# Patient Record
Sex: Female | Born: 1950
Health system: Southern US, Community
[De-identification: ages and names within clinical notes are randomized; demographics above are authoritative.]

## PROBLEM LIST (undated history)

## (undated) DIAGNOSIS — I700 Atherosclerosis of aorta: Secondary | ICD-10-CM

## (undated) DIAGNOSIS — R0600 Dyspnea, unspecified: Secondary | ICD-10-CM

## (undated) DIAGNOSIS — I2510 Atherosclerotic heart disease of native coronary artery without angina pectoris: Secondary | ICD-10-CM

## (undated) DIAGNOSIS — J449 Chronic obstructive pulmonary disease, unspecified: Secondary | ICD-10-CM

## (undated) DIAGNOSIS — I82419 Acute embolism and thrombosis of unspecified femoral vein: Secondary | ICD-10-CM

## (undated) DIAGNOSIS — M1990 Unspecified osteoarthritis, unspecified site: Secondary | ICD-10-CM

## (undated) DIAGNOSIS — G8929 Other chronic pain: Secondary | ICD-10-CM

## (undated) DIAGNOSIS — J984 Other disorders of lung: Secondary | ICD-10-CM

## (undated) DIAGNOSIS — I10 Essential (primary) hypertension: Secondary | ICD-10-CM

## (undated) DIAGNOSIS — IMO0002 Reserved for concepts with insufficient information to code with codable children: Secondary | ICD-10-CM

## (undated) HISTORY — DX: Chronic obstructive pulmonary disease, unspecified: J44.9

## (undated) HISTORY — DX: Other disorders of lung: J98.4

## (undated) HISTORY — PX: TOTAL HIP ARTHROPLASTY: SHX124

## (undated) HISTORY — DX: Acute embolism and thrombosis of unspecified femoral vein: I82.419

## (undated) HISTORY — DX: Unspecified osteoarthritis, unspecified site: M19.90

## (undated) HISTORY — DX: Atherosclerosis of aorta: I70.0

## (undated) HISTORY — DX: Atherosclerotic heart disease of native coronary artery without angina pectoris: I25.10

## (undated) HISTORY — DX: Other chronic pain: G89.29

## (undated) HISTORY — DX: Reserved for concepts with insufficient information to code with codable children: IMO0002

---

## 1971-10-16 HISTORY — PX: TUBAL LIGATION: SHX77

## 2000-03-13 ENCOUNTER — Encounter: Admission: RE | Admit: 2000-03-13 | Discharge: 2000-03-13 | Payer: Self-pay | Admitting: *Deleted

## 2000-03-13 ENCOUNTER — Encounter: Payer: Self-pay | Admitting: *Deleted

## 2001-03-11 ENCOUNTER — Encounter: Payer: Self-pay | Admitting: Unknown Physician Specialty

## 2001-03-11 ENCOUNTER — Encounter: Admission: RE | Admit: 2001-03-11 | Discharge: 2001-03-11 | Payer: Self-pay | Admitting: Unknown Physician Specialty

## 2003-09-21 ENCOUNTER — Encounter: Admission: RE | Admit: 2003-09-21 | Discharge: 2003-09-21 | Payer: Self-pay | Admitting: Unknown Physician Specialty

## 2012-02-07 ENCOUNTER — Other Ambulatory Visit (HOSPITAL_COMMUNITY): Payer: Self-pay | Admitting: Pulmonary Disease

## 2012-02-07 ENCOUNTER — Ambulatory Visit (HOSPITAL_COMMUNITY)
Admission: RE | Admit: 2012-02-07 | Discharge: 2012-02-07 | Disposition: A | Discharge status: Home / Self Care | Payer: BC Managed Care – PPO | Source: Ambulatory Visit | Attending: Pulmonary Disease | Admitting: Pulmonary Disease

## 2012-02-07 DIAGNOSIS — J449 Chronic obstructive pulmonary disease, unspecified: Secondary | ICD-10-CM | POA: Insufficient documentation

## 2012-02-07 DIAGNOSIS — J4489 Other specified chronic obstructive pulmonary disease: Secondary | ICD-10-CM | POA: Insufficient documentation

## 2012-02-07 DIAGNOSIS — A150 Tuberculosis of lung: Secondary | ICD-10-CM

## 2013-01-09 ENCOUNTER — Encounter: Payer: Self-pay | Admitting: Vascular Surgery

## 2013-01-09 ENCOUNTER — Other Ambulatory Visit: Payer: Self-pay

## 2013-01-09 DIAGNOSIS — I8012 Phlebitis and thrombophlebitis of left femoral vein: Secondary | ICD-10-CM

## 2013-02-24 ENCOUNTER — Encounter: Payer: Self-pay | Admitting: Vascular Surgery

## 2013-02-25 ENCOUNTER — Encounter (INDEPENDENT_AMBULATORY_CARE_PROVIDER_SITE_OTHER): Payer: BC Managed Care – PPO | Admitting: *Deleted

## 2013-02-25 ENCOUNTER — Ambulatory Visit (INDEPENDENT_AMBULATORY_CARE_PROVIDER_SITE_OTHER): Payer: BC Managed Care – PPO | Admitting: Vascular Surgery

## 2013-02-25 ENCOUNTER — Encounter: Payer: Self-pay | Admitting: Vascular Surgery

## 2013-02-25 VITALS — BP 132/67 | HR 75 | Ht 64.5 in | Wt 152.0 lb

## 2013-02-25 DIAGNOSIS — I8012 Phlebitis and thrombophlebitis of left femoral vein: Secondary | ICD-10-CM

## 2013-02-25 DIAGNOSIS — I8010 Phlebitis and thrombophlebitis of unspecified femoral vein: Secondary | ICD-10-CM

## 2013-02-25 NOTE — Progress Notes (Signed)
Vascular and Vein Specialist of [facility] [place]   Patient name: Teresa Edwards MRN: [id number] DOB: 20-Sep-1950 Sex: female   Referred by: Charm Barges  Reason for referral:  Chief Complaint  Patient presents with  . New Evaluation    hx of DVT - knot on upper left le- Dr. Charm Barges    HISTORY OF PRESENT ILLNESS: Patient has today for discussion of her left leg venous hypertension. She is very pleasant 62 year old female who in November of 2013 had left leg pain and swelling. She underwent a venous duplex that time was found to have a left leg DVT he was admitted to the hospital for treatment. She has been on Xaralto since that time. She is here today with concern regarding changes of chronic venous hypertension. She reports that she is having a discoloration and swelling in her left leg and also has some areas of varicosities appearing in her medial left thigh. She denies any symptoms of arterial insufficiency. She does have a long history of degenerative disc disease in her back and had to discontinue treatment for this with injections due to her anticoagulation therapy. She denies any cardiac difficulty and has no history of stroke    Past Medical History  Diagnosis Date  . DDD (degenerative disc disease)   . DVT femoral (deep venous thrombosis) with thrombophlebitis   . DJD (degenerative joint disease)   . COPD (chronic obstructive pulmonary disease)     Past Surgical History  Procedure Laterality Date  . Tubal ligation  10/1971    History   Social History  . Marital Status: Married    Spouse Name: N/A    Number of Children: N/A  . Years of Education: N/A   Occupational History  . Not on file.   Social History Main Topics  . Smoking status: Current Every Day Smoker -- 0.50 packs/day for 40 years    Types: Cigarettes  . Smokeless tobacco: Never Used  . Alcohol Use: No  . Drug Use: No  . Sexually Active: Not on file   Other Topics Concern  . Not on file   Social History  Narrative  . No narrative on file    Family History  Problem Relation Age of Onset  . Deep vein thrombosis Mother   . Other Mother     varicose veins  . Hypertension Sister   . Other Sister     varicose veins  . Cancer Brother     Allergies as of 02/25/2013  . (No Known Allergies)    Current Outpatient Prescriptions on File Prior to Visit  Medication Sig Dispense Refill  . ALPRAZolam (XANAX) 0.5 MG tablet Take 0.5 mg by mouth 3 (three) times daily.      . celecoxib (CELEBREX) 200 MG capsule Take 200 mg by mouth daily.      . cyclobenzaprine (FLEXERIL) 10 MG tablet Take 10 mg by mouth daily.      Marland Kitchen oxycodone (ROXICODONE) 30 MG immediate release tablet Take 30 mg by mouth every 4 (four) hours as needed for pain.      . Rivaroxaban (XARELTO) 20 MG TABS Take 20 mg by mouth daily.       No current facility-administered medications on file prior to visit.     REVIEW OF SYSTEMS:  Positives indicated with an "X"  CARDIOVASCULAR:  [ ]  chest pain   [ ]  chest pressure   [ ]  palpitations   [ ]  orthopnea   [x]  dyspnea on exertion   [ ]   claudication   [ ]  rest pain   [x ] DVT   [ ]  phlebitis PULMONARY:   [ ]  productive cough   [ ]  asthma   [ ]  wheezing NEUROLOGIC:   [ ]  weakness  [ ]  paresthesias  [ ]  aphasia  [ ]  amaurosis  [ ]  dizziness HEMATOLOGIC:   [ ]  bleeding problems   [ ]  clotting disorders MUSCULOSKELETAL:  [ ]  joint pain   [ ]  joint swelling GASTROINTESTINAL: [ ]   blood in stool  [ ]   hematemesis GENITOURINARY:  [ ]   dysuria  [ ]   hematuria PSYCHIATRIC:  [ ]  history of major depression INTEGUMENTARY:  [ ]  rashes  [ ]  ulcers CONSTITUTIONAL:  [ ]  fever   [ ]  chills  PHYSICAL EXAMINATION:  General: The patient is a well-nourished female, in no acute distress. Vital signs are BP 132/67  Pulse 75  Ht 5' 4.5" (1.638 m)  Wt 152 lb (68.947 kg)  BMI 25.7 kg/m2  SpO2 100% Pulmonary: There is a good air exchange bilaterally without wheezing or rales. Abdomen: Soft and  non-tender with normal pitch bowel sounds. Musculoskeletal: There are no major deformities.  There is no significant extremity pain. Neurologic: No focal weakness or paresthesias are detected, Skin: There are no ulcer or rashes noted. Psychiatric: The patient has normal affect. Cardiovascular: There is a regular rate and rhythm without significant murmur appreciated. Pulse status: 2+ radial and 2+ posterior tibial pulses bilaterally She does have skin changes on her left leg with extensive telangiectasia over foot and ankle and some thickening over the medial aspect of her ankle. There no open ulceration [facility] Vascular Lab Studies:  Ordered and Independently Reviewed chronic thrombus in her left femoral and popliteal veins. No evidence of superficial thrombophlebitis  Impression and Plan:  DVT in November 2013 with appropriate anticoagulation treatment since that time. I had a long discussion with the patient. She did not have any prior history of DVT and no documented hypercoagulable state. I feel that it is safe to discontinue her anticoagulation therapy at any time. She will discuss this with Dr. Charm Barges. I did explain the area of concern in her thigh was related to varicosities that she is developing due to the venous hypertension. She is having no discomfort over these areas. Also explained the changes in skin that she is developing in her left ankle. She does have a compression hose but unfortunately is unable to wear these do to her degenerative disc disease in her back. A I discussed the importance of elevation and compression. She will attempt this and will see [place] again on an as-needed basis    Terilynn Buresh [facility] [place] Office: [phone]

## 2013-10-29 ENCOUNTER — Ambulatory Visit (INDEPENDENT_AMBULATORY_CARE_PROVIDER_SITE_OTHER): Payer: BC Managed Care – PPO | Admitting: Neurology

## 2013-10-29 ENCOUNTER — Encounter (INDEPENDENT_AMBULATORY_CARE_PROVIDER_SITE_OTHER): Payer: Self-pay

## 2013-10-29 ENCOUNTER — Encounter: Payer: Self-pay | Admitting: Neurology

## 2013-10-29 VITALS — BP 147/82 | HR 78 | Ht 64.5 in | Wt 147.0 lb

## 2013-10-29 DIAGNOSIS — R269 Unspecified abnormalities of gait and mobility: Secondary | ICD-10-CM

## 2013-10-29 DIAGNOSIS — M545 Low back pain, unspecified: Secondary | ICD-10-CM

## 2013-10-29 DIAGNOSIS — M79609 Pain in unspecified limb: Secondary | ICD-10-CM

## 2013-10-29 NOTE — Progress Notes (Signed)
Reason for visit: Leg discomfort  Teresa Edwards is a 63 y.o. female  History of present illness:  Teresa Edwards is a 63 year old white female with a history of low back pain with L4-5 spondylitic degeneration. The patient has a history of chronic low back pain that has been unchanged. Three months ago however, the patient indicated that she began having discomfort in both legs. The patient primarily has pain below the knees in the calf areas with walking, and this tends to go away with sitting or lying down. The patient will sometimes have pain above the knees and at the knee level. The patient denies any numbness in the lower extremities. Both legs are painful, right equal to left. The patient indicates that she has to walk slowly. The patient is begun using a cane for ambulation, and she denies any falls. The patient has not had any neck pain or pain down the legs. The patient has had no change in the function of the bowels or the bladder. Recent MRI evaluation of the low back is not available to me, but it appears that there is no evidence of nerve root compression or severe spinal stenosis. The patient may have some minimal spinal stenosis at the L2-3 level. The patient is sent to this office for further evaluation. The patient recently had a DVT involving the left leg. Arterial Dopplers have not been done recently.  Past Medical History  Diagnosis Date  . DDD (degenerative disc disease)   . DVT femoral (deep venous thrombosis) with thrombophlebitis   . DJD (degenerative joint disease)   . COPD (chronic obstructive pulmonary disease)     Past Surgical History  Procedure Laterality Date  . Tubal ligation  10/1971    Family History  Problem Relation Age of Onset  . Deep vein thrombosis Mother   . Other Mother     varicose veins  . Hypertension Sister   . Other Sister     varicose veins  . Cancer Brother     Social history:  reports that she has been smoking Cigarettes.  She  has a 20 pack-year smoking history. She has never used smokeless tobacco. She reports that she does not drink alcohol or use illicit drugs.  Medications:  Current Outpatient Prescriptions on File Prior to Visit  Medication Sig Dispense Refill  . ALPRAZolam (XANAX) 0.5 MG tablet Take 0.5 mg by mouth 3 (three) times daily.      Marland Kitchen. oxycodone (ROXICODONE) 30 MG immediate release tablet Take 30 mg by mouth every 4 (four) hours as needed for pain.       No current facility-administered medications on file prior to visit.     No Known Allergies  ROS:  Out of a complete 14 system review of symptoms, the patient complains only of the following symptoms, and all other reviewed systems are negative.  Leg pain, back pain Gait disorder  Blood pressure 147/82, pulse 78, height 5' 4.5" (1.638 m), weight 147 lb (66.679 kg).  Physical Exam  General: The patient is alert and cooperative at the time of the examination.  Eyes: Pupils are equal, round, and reactive to light. Discs are flat bilaterally.  Neck: The neck is supple, no carotid bruits are noted.  Respiratory: The respiratory examination is clear.  Cardiovascular: The cardiovascular examination reveals a regular rate and rhythm, no obvious murmurs or rubs are noted.  Neuromuscular: The patient is able to flex approximately 110 with the low back. No significant pain  with palpation of the low back is noted. No significant discomfort is noted with rotation of the hips.  Skin: Extremities are without significant edema. The posterior tibial pulse is felt on the right foot, not on the left. Dorsalis pedis pulses are not palpable on either side.  Neurologic Exam  Mental status: The patient is alert and oriented x 3 at the time of the examination. The patient has apparent normal recent and remote memory, with an apparently normal attention span and concentration ability.  Cranial nerves: Facial symmetry is present. There is good sensation of  the face to pinprick and soft touch bilaterally. The strength of the facial muscles and the muscles to head turning and shoulder shrug are normal bilaterally. Speech is well enunciated, no aphasia or dysarthria is noted. Extraocular movements are full. Visual fields are full. The tongue is midline, and the patient has symmetric elevation of the soft palate. No obvious hearing deficits are noted.  Motor: The motor testing reveals 5 over 5 strength of all 4 extremities. Good symmetric motor tone is noted throughout. The patient is able to arise from seated position with arms crossed.  Sensory: Sensory testing is intact to pinprick, soft touch, vibration sensation, and position sense on all 4 extremities, with exception that position sense is decreased in the left foot. No evidence of extinction is noted.  Coordination: Cerebellar testing reveals good finger-nose-finger and heel-to-shin bilaterally.  Gait and station: Gait is slow, slightly wide-based. Tandem gait is slightly unsteady. Romberg is negative. No drift is seen.  Reflexes: Deep tendon reflexes are symmetric and normal bilaterally, with exception of ankle jerk reflexes are depressed bilaterally. Toes are downgoing bilaterally.   Assessment/Plan:  1. Chronic low back pain  2. Lower extremity discomfort, gait disorder  The patient has had chronic low back pain but more recently she has begun to have lower extremity pain and discomfort. The pain is there with sitting or lying down, but is worse with ambulation. The patient will be set up for nerve conduction studies of both legs, and EMG of the lower extremities. If this is unremarkable, the patient may be sent for arterial dopplers of the lower extremities. The patient takes oxycodone if needed for pain.  Marlan Palau. Keith Luberta Grabinski MD 10/29/2013 7:18 PM  [facility] [address] [place] [id number]  Phone [phone] Fax [phone]

## 2013-10-29 NOTE — Patient Instructions (Signed)
Back Pain, Adult Low back pain is very common. About 1 in 5 people have back pain.The cause of low back pain is rarely dangerous. The pain often gets better over time.About half of people with a sudden onset of back pain feel better in just 2 weeks. About 8 in 10 people feel better by 6 weeks.  CAUSES Some common causes of back pain include:  Strain of the muscles or ligaments supporting the spine.  Wear and tear (degeneration) of the spinal discs.  Arthritis.  Direct injury to the back. DIAGNOSIS Most of the time, the direct cause of low back pain is not known.However, back pain can be treated effectively even when the exact cause of the pain is unknown.Answering your caregiver's questions about your overall health and symptoms is one of the most accurate ways to make sure the cause of your pain is not dangerous. If your caregiver needs more information, he or she may order lab work or imaging tests (X-rays or MRIs).However, even if imaging tests show changes in your back, this usually does not require surgery. HOME CARE INSTRUCTIONS For many people, back pain returns.Since low back pain is rarely dangerous, it is often a condition that people can learn to manageon their own.   Remain active. It is stressful on the back to sit or stand in one place. Do not sit, drive, or stand in one place for more than 30 minutes at a time. Take short walks on level surfaces as soon as pain allows.Try to increase the length of time you walk each day.  Do not stay in bed.Resting more than 1 or 2 days can delay your recovery.  Do not avoid exercise or work.Your body is made to move.It is not dangerous to be active, even though your back may hurt.Your back will likely heal faster if you return to being active before your pain is gone.  Pay attention to your body when you bend and lift. Many people have less discomfortwhen lifting if they bend their knees, keep the load close to their bodies,and  avoid twisting. Often, the most comfortable positions are those that put less stress on your recovering back.  Find a comfortable position to sleep. Use a firm mattress and lie on your side with your knees slightly bent. If you lie on your back, put a pillow under your knees.  Only take over-the-counter or prescription medicines as directed by your caregiver. Over-the-counter medicines to reduce pain and inflammation are often the most helpful.Your caregiver may prescribe muscle relaxant drugs.These medicines help dull your pain so you can more quickly return to your normal activities and healthy exercise.  Put ice on the injured area.  Put ice in a plastic bag.  Place a towel between your skin and the bag.  Leave the ice on for 15-20 minutes, 03-04 times a day for the first 2 to 3 days. After that, ice and heat may be alternated to reduce pain and spasms.  Ask your caregiver about trying back exercises and gentle massage. This may be of some benefit.  Avoid feeling anxious or stressed.Stress increases muscle tension and can worsen back pain.It is important to recognize when you are anxious or stressed and learn ways to manage it.Exercise is a great option. SEEK MEDICAL CARE IF:  You have pain that is not relieved with rest or medicine.  You have pain that does not improve in 1 week.  You have new symptoms.  You are generally not feeling well. SEEK   IMMEDIATE MEDICAL CARE IF:   You have pain that radiates from your back into your legs.  You develop new bowel or bladder control problems.  You have unusual weakness or numbness in your arms or legs.  You develop nausea or vomiting.  You develop abdominal pain.  You feel faint. Document Released: 07/03/2005 Document Revised: 01/02/2012 Document Reviewed: 11/21/2010 Exit[employer] Patient Information 2014 [employer].  

## 2013-10-30 LAB — SEDIMENTATION RATE: SED RATE: 6 mm/h (ref 0–40)

## 2013-10-30 LAB — ANA W/REFLEX: ANA: NEGATIVE

## 2013-10-30 LAB — ANGIOTENSIN CONVERTING ENZYME: ANGIO CONVERT ENZYME: 35 U/L (ref 14–82)

## 2013-10-30 LAB — CK: Total CK: 51 U/L (ref 24–173)

## 2013-10-30 LAB — RHEUMATOID FACTOR: RHEUMATOID FACTOR: 9.6 [IU]/mL (ref 0.0–13.9)

## 2013-10-30 LAB — LYME, TOTAL AB TEST/REFLEX

## 2013-10-31 NOTE — Progress Notes (Signed)
Quick Note:  I called and gave her the results of labs. She will activate mychart, if not will get copy when in for [facility]. ______

## 2013-11-10 ENCOUNTER — Ambulatory Visit (INDEPENDENT_AMBULATORY_CARE_PROVIDER_SITE_OTHER): Payer: BC Managed Care – PPO | Admitting: Neurology

## 2013-11-10 ENCOUNTER — Encounter (INDEPENDENT_AMBULATORY_CARE_PROVIDER_SITE_OTHER): Payer: Self-pay | Admitting: Radiology

## 2013-11-10 DIAGNOSIS — M79609 Pain in unspecified limb: Secondary | ICD-10-CM

## 2013-11-10 DIAGNOSIS — Z0289 Encounter for other administrative examinations: Secondary | ICD-10-CM

## 2013-11-10 DIAGNOSIS — I70219 Atherosclerosis of native arteries of extremities with intermittent claudication, unspecified extremity: Secondary | ICD-10-CM

## 2013-11-10 DIAGNOSIS — M545 Low back pain, unspecified: Secondary | ICD-10-CM

## 2013-11-10 DIAGNOSIS — R269 Unspecified abnormalities of gait and mobility: Secondary | ICD-10-CM

## 2013-11-10 NOTE — Progress Notes (Signed)
Teresa Edwards is a 63 year old patient with a long-standing history of low back pain. Over the last 3 months, she has developed pain in the calf muscles bilaterally with walking. She is being evaluated for possible lumbosacral radiculopathy. No evidence of spinal stenosis is seen by MRI of the lumbosacral spine.  EMG evaluations of both legs were essentially normal, without evidence of an overlying lumbosacral radiculopathy. No evidence of a peripheral neuropathy is seen.  The patient will be sent for arterial Doppler studies of the legs.

## 2013-11-10 NOTE — Procedures (Signed)
     HISTORY:  Teresa Edwards is a 63 year old patient with a history of chronic low back pain. Within the last 3 months, she has noted some issues with discomfort in the legs and the calf muscles with walking, better with rest. The patient is being evaluated for this new issue.  NERVE CONDUCTION STUDIES:  Nerve conduction studies were performed on both lower extremities. The distal motor latencies and motor amplitudes for the peroneal and posterior tibial nerves were within normal limits. The nerve conduction velocities for these nerves were also normal. The H reflex latencies were prolonged bilaterally. The F wave latencies for the peroneal nerves were normal bilaterally, and normal for the right posterior tibial nerve. The left posterior tibial nerve F wave latency was prolonged. The sensory latencies for the peroneal nerves were within normal limits.   EMG STUDIES:  EMG study was performed on the left lower extremity:  The tibialis anterior muscle reveals 2 to 4K motor units with full recruitment. No fibrillations or positive waves were seen. The peroneus tertius muscle reveals 2 to 4K motor units with full recruitment. No fibrillations or positive waves were seen. The medial gastrocnemius muscle reveals 1 to 3K motor units with full recruitment. No fibrillations or positive waves were seen. Some increased insertional activity was seen. The vastus lateralis muscle reveals 2 to 4K motor units with full recruitment. No fibrillations or positive waves were seen. The iliopsoas muscle reveals 2 to 4K motor units with full recruitment. No fibrillations or positive waves were seen. The biceps femoris muscle (long head) reveals 2 to 4K motor units with full recruitment. No fibrillations or positive waves were seen. The lumbosacral paraspinal muscles were tested at 3 levels, and revealed no abnormalities of insertional activity at all 3 levels tested. There was good relaxation.  EMG study was  performed on the right lower extremity:  The tibialis anterior muscle reveals 2 to 4K motor units with full recruitment. No fibrillations or positive waves were seen. The peroneus tertius muscle reveals 2 to 4K motor units with full recruitment. No fibrillations or positive waves were seen. The medial gastrocnemius muscle reveals 1 to 3K motor units with full recruitment. No fibrillations or positive waves were seen. The vastus lateralis muscle reveals 2 to 4K motor units with full recruitment. No fibrillations or positive waves were seen. The iliopsoas muscle reveals 2 to 4K motor units with full recruitment. No fibrillations or positive waves were seen. The biceps femoris muscle (long head) reveals 2 to 4K motor units with full recruitment. No fibrillations or positive waves were seen. The lumbosacral paraspinal muscles were tested at 3 levels, and revealed no abnormalities of insertional activity at all 3 levels tested. There was good relaxation.   IMPRESSION:  Nerve conduction studies done on both lower extremities were relatively unremarkable with exception of prolongation of the left posterior tibial F-wave latency and the H reflex latencies bilaterally. EMG evaluation of the right lower extremity was unremarkable, without evidence of an overlying lumbosacral radiculopathy. EMG of the left lower extremity was essentially normal with exception of minimal increased insertional activity of the gastrocnemius muscle. There is no clear evidence of an overlying lumbosacral radiculopathy on either side.  Teresa Edwards. Keith Kamerin Axford MD 11/10/2013 1:59 PM  [facility] [address] [place], [place] [id number]  Phone [phone] Fax [phone]

## 2013-11-26 ENCOUNTER — Other Ambulatory Visit (HOSPITAL_COMMUNITY): Payer: Self-pay | Admitting: Cardiology

## 2013-11-26 DIAGNOSIS — I70219 Atherosclerosis of native arteries of extremities with intermittent claudication, unspecified extremity: Secondary | ICD-10-CM

## 2013-11-26 DIAGNOSIS — I739 Peripheral vascular disease, unspecified: Secondary | ICD-10-CM

## 2013-11-28 ENCOUNTER — Ambulatory Visit (HOSPITAL_COMMUNITY): Payer: BC Managed Care – PPO | Attending: Neurology | Admitting: Cardiology

## 2013-11-28 DIAGNOSIS — I70219 Atherosclerosis of native arteries of extremities with intermittent claudication, unspecified extremity: Secondary | ICD-10-CM | POA: Insufficient documentation

## 2013-11-28 DIAGNOSIS — M79609 Pain in unspecified limb: Secondary | ICD-10-CM

## 2013-11-28 DIAGNOSIS — I739 Peripheral vascular disease, unspecified: Secondary | ICD-10-CM

## 2013-11-28 NOTE — Progress Notes (Signed)
LEA Doppler performed 

## 2013-11-30 ENCOUNTER — Telehealth: Payer: Self-pay | Admitting: Neurology

## 2013-11-30 NOTE — Telephone Encounter (Signed)
I called the patient. The arterial dopplers of the legs were normal. Not clear what the cause of the leg pain is. L/S MRI done recently. May need to treat the symptoms only. EMG was OK.

## 2014-06-03 ENCOUNTER — Encounter: Payer: Self-pay | Admitting: Neurology

## 2014-06-09 ENCOUNTER — Encounter: Payer: Self-pay | Admitting: Neurology

## 2014-08-28 ENCOUNTER — Institutional Professional Consult (permissible substitution): Payer: Self-pay | Admitting: Internal Medicine

## 2014-08-31 ENCOUNTER — Encounter: Payer: Self-pay | Admitting: Pulmonary Disease

## 2014-08-31 ENCOUNTER — Telehealth: Payer: Self-pay | Admitting: Pulmonary Disease

## 2014-08-31 ENCOUNTER — Ambulatory Visit (INDEPENDENT_AMBULATORY_CARE_PROVIDER_SITE_OTHER): Payer: Medicare Other | Admitting: Pulmonary Disease

## 2014-08-31 VITALS — BP 142/82 | HR 75 | Temp 97.0°F | Ht 64.0 in | Wt 134.0 lb

## 2014-08-31 DIAGNOSIS — J984 Other disorders of lung: Secondary | ICD-10-CM

## 2014-08-31 DIAGNOSIS — B440 Invasive pulmonary aspergillosis: Secondary | ICD-10-CM | POA: Insufficient documentation

## 2014-08-31 MED ORDER — AMOXICILLIN-POT CLAVULANATE 875-125 MG PO TABS: 1.0000 | ORAL_TABLET | Freq: Two times a day (BID) | ORAL | Status: DC

## 2014-08-31 NOTE — Telephone Encounter (Signed)
I spoke with Beth with [facility].  She called [facility] and switch over augmentin rx - nothing further needed.

## 2014-08-31 NOTE — Patient Instructions (Signed)
Will treat with augmentin 875mg  one in am and pm with large glass of water on a full stomach for 2 weeks. Will schedule for followup scan of chest in 4 weeks.  If there is no change or if larger, will need a biopsy Stop smoking!! Will call you once I see results of chest ct.

## 2014-08-31 NOTE — Progress Notes (Signed)
   Subjective:    Patient ID: Teresa Edwards, female    DOB: [birth year], 64 y.o.   MRN: [id number]  HPI The patient is a 64 year old female who I've been asked to see for an abnormal CT chest. She has a long history of tobacco abuse, and unfortunately continues to smoke. She developed bronchitis-like symptoms in November with increasing shortness of breath, chest congestion, and cough. She did not have purulent mucus or hemoptysis. She was treated at that time with Biaxin, and felt that she improved in every respect except for her cough. She had a chest x-ray the end of November that did not show any acute abnormality. Because of her persistent cough, she had a follow-up film the middle of January of this year, and it showed an abnormal density at the right base that was new. This was followed by a CT chest which showed a 2.5 cm cavitary density in the right lower lobe, with no significant mediastinal lymphadenopathy. So had significant emphysematous changes. The patient currently is bringing up clear mucus, but is not congested and does not feel that her breathing is any worse than her usual baseline. His had no pleuritic chest pain or hemoptysis. She has no history of TB exposure, but tells me that she did have a positive [facility]. Chest x-ray was unremarkable, and she was not started on chemoprophylaxis. The patient denies any significant anorexia or weight loss. It should be noted that she does have poor dentition.   Review of Systems  Constitutional: Negative for fever and unexpected weight change.  HENT: Negative for congestion, dental problem, ear pain, nosebleeds, postnasal drip, rhinorrhea, sinus pressure, sneezing, sore throat and trouble swallowing.   Eyes: Negative for redness and itching.  Respiratory: Positive for cough and shortness of breath. Negative for chest tightness and wheezing.   Cardiovascular: Positive for leg swelling. Negative for  palpitations.  Gastrointestinal: Negative for nausea and vomiting.  Genitourinary: Negative for dysuria.  Musculoskeletal: Negative for joint swelling.  Skin: Negative for rash.  Neurological: Negative for headaches.  Hematological: Does not bruise/bleed easily.  Psychiatric/Behavioral: Negative for dysphoric mood. The patient is not nervous/anxious.        Objective:   Physical Exam Constitutional:  Thin female, no acute distress  HENT:  Nares patent without discharge, but narrowed bilat  Oropharynx without exudate, palate and uvula are normal  Eyes:  Perrla, eomi, no scleral icterus  Neck:  No JVD, no TMG  Cardiovascular:  Normal rate, regular rhythm, no rubs or gallops.  No murmurs        Intact distal pulses  Pulmonary :  decreased breath sounds, no stridor or respiratory distress   No rales, rhonchi, or wheezing  Abdominal:  Soft, nondistended, bowel sounds present.  No tenderness noted.   Musculoskeletal:  1+ lower extremity edema noted.  Lymph Nodes:  No cervical lymphadenopathy noted  Skin:  No cyanosis noted  Neurologic:  Alert, appropriate, moves all 4 extremities without obvious deficit.         Assessment & Plan:

## 2014-08-31 NOTE — Assessment & Plan Note (Signed)
The patient has an abnormal density in the right lower lobe that appeared on a chest x-ray between the end of November of last year in the middle of January this year. The CT chest shows a 2.5 cm cavitary density in the right lower lobe with no other significant abnormalities. I suspect it is unlikely this represents a malignancy developing in a 6-8 week period, although I cannot totally exclude this with 100% certainty. More than likely, this represents a lung abscess, especially given the patient's poor dentition. She has been treated with a macrolide back in November, but has not been treated for a lung abscess. I would like to treat her with a 2 week course of Augmentin, and then repeat her CT 2 weeks after that. I have also offered to biopsy the lesion in question if she is going to worry about this over the next month. However, there are potential risk such as bleeding and pneumothorax. After a long discussion with her and her family, we have all decided to try the antibiotics prior to biopsying the abnormality.

## 2014-09-08 ENCOUNTER — Institutional Professional Consult (permissible substitution): Payer: Self-pay | Admitting: Internal Medicine

## 2014-09-28 ENCOUNTER — Ambulatory Visit (INDEPENDENT_AMBULATORY_CARE_PROVIDER_SITE_OTHER)
Admission: RE | Admit: 2014-09-28 | Discharge: 2014-09-28 | Disposition: A | Discharge status: Home / Self Care | Payer: 59 | Source: Ambulatory Visit | Attending: Pulmonary Disease | Admitting: Pulmonary Disease

## 2014-09-28 DIAGNOSIS — J984 Other disorders of lung: Secondary | ICD-10-CM

## 2014-09-29 ENCOUNTER — Other Ambulatory Visit: Payer: Self-pay | Admitting: Pulmonary Disease

## 2014-10-01 ENCOUNTER — Other Ambulatory Visit: Payer: Self-pay | Admitting: Pulmonary Disease

## 2014-10-01 MED ORDER — AMOXICILLIN-POT CLAVULANATE 875-125 MG PO TABS: 1.0000 | ORAL_TABLET | Freq: Two times a day (BID) | ORAL | Status: DC

## 2014-11-13 ENCOUNTER — Encounter: Payer: Self-pay | Admitting: Pulmonary Disease

## 2014-11-13 ENCOUNTER — Ambulatory Visit (INDEPENDENT_AMBULATORY_CARE_PROVIDER_SITE_OTHER): Payer: 59 | Admitting: Pulmonary Disease

## 2014-11-13 ENCOUNTER — Encounter (INDEPENDENT_AMBULATORY_CARE_PROVIDER_SITE_OTHER): Payer: Self-pay

## 2014-11-13 DIAGNOSIS — J984 Other disorders of lung: Secondary | ICD-10-CM | POA: Diagnosis not present

## 2014-11-13 NOTE — Progress Notes (Signed)
   Subjective:    Patient ID: Teresa Edwards, female    DOB: Feb 21, 1951, 64 y.o.   MRN: [id number]  HPI The patient comes in today for follow-up of her right lower lobe mass, felt secondary to a lung abscess. He was treated with 2 weeks of Augmentin, and a follow-up scan showed significant improvement. She has been treated with an additional 2 weeks of Augmentin, and comes in today for follow-up. She feels like her cough is significantly improved, and has no further chest congestion or purulence. However, she unfortunately continues to smoke, but she is trying to cut back. Her appetite has improved, and her weight stabilized.   Review of Systems  Constitutional: Negative for fever and unexpected weight change.  HENT: Negative for congestion, dental problem, ear pain, nosebleeds, postnasal drip, rhinorrhea, sinus pressure, sneezing, sore throat and trouble swallowing.   Eyes: Negative for redness and itching.  Respiratory: Positive for cough and shortness of breath. Negative for chest tightness and wheezing.   Cardiovascular: Positive for leg swelling. Negative for palpitations.  Gastrointestinal: Negative for nausea and vomiting.  Genitourinary: Negative for dysuria.  Musculoskeletal: Negative for joint swelling.  Skin: Negative for rash.  Neurological: Negative for headaches.  Hematological: Does not bruise/bleed easily.  Psychiatric/Behavioral: Negative for dysphoric mood. The patient is not nervous/anxious.        Objective:   Physical Exam Thin female in no acute distress Nose without purulence or discharge noted Neck without lymphadenopathy or thyromegaly Chest with decreased breath sounds, no wheezes or crackles Cardiac exam with regular rate and rhythm Lower extremities without edema, no cyanosis Alert and oriented, moves all 4 extremities.       Assessment & Plan:

## 2014-11-13 NOTE — Patient Instructions (Signed)
Keep working on smoking cessation. Will schedule for followup limited scan the end of May.   Will call you with the results.

## 2014-11-13 NOTE — Assessment & Plan Note (Signed)
The patient has a history of a right lower lobe mass, but had a very significant response to treatment with Augmentin. She has now completed another 2 weeks of therapy, and feels that she is greatly improved. Given her extensive smoking history, we need to do a follow-up scan to make sure that her right lower lobe process has resolved. I also asked her to try and improve her dentition, since this was the cause of her right lower lobe process. Finally, I have encouraged her to work aggressively on total smoking cessation.

## 2014-12-15 ENCOUNTER — Ambulatory Visit (INDEPENDENT_AMBULATORY_CARE_PROVIDER_SITE_OTHER)
Admission: RE | Admit: 2014-12-15 | Discharge: 2014-12-15 | Disposition: A | Discharge status: Home / Self Care | Payer: 59 | Source: Ambulatory Visit | Attending: Pulmonary Disease | Admitting: Pulmonary Disease

## 2014-12-15 DIAGNOSIS — J984 Other disorders of lung: Secondary | ICD-10-CM

## 2014-12-21 ENCOUNTER — Telehealth: Payer: Self-pay | Admitting: Pulmonary Disease

## 2014-12-21 DIAGNOSIS — R911 Solitary pulmonary nodule: Secondary | ICD-10-CM

## 2014-12-21 NOTE — Telephone Encounter (Signed)
Patient notified of results and agreed to PET scan.  Order for PET scan entered.  Nothing further needed.

## 2014-12-29 ENCOUNTER — Encounter (HOSPITAL_COMMUNITY)
Admission: RE | Admit: 2014-12-29 | Discharge: 2014-12-29 | Disposition: A | Discharge status: Home / Self Care | Payer: 59 | Source: Ambulatory Visit | Attending: Pulmonary Disease | Admitting: Pulmonary Disease

## 2014-12-29 DIAGNOSIS — R911 Solitary pulmonary nodule: Secondary | ICD-10-CM | POA: Diagnosis not present

## 2014-12-29 MED ORDER — FLUDEOXYGLUCOSE F - 18 (FDG) INJECTION
6.7000 | Freq: Once | INTRAVENOUS | Status: AC | PRN
  Administered 2014-12-29: 6.7 via INTRAVENOUS

## 2014-12-30 LAB — GLUCOSE, CAPILLARY: Glucose-Capillary: 86 mg/dL (ref 65–99)

## 2016-10-11 IMAGING — CT CT CHEST LIMITED W/O CM
2 of 3 series · 15 of 30 positions shown, 18 images · IV contrast (Omnipaque 300)
Comparison: September 28, 2014.

CLINICAL DATA: Right lower lobe cavitating lung mass.

EXAM:
CT CHEST WITHOUT CONTRAST
TECHNIQUE: Multidetector CT imaging of the chest was performed following the
standard protocol without IV contrast.

[Series 2: chest routine with · axial · 0.68mm/px · z∈[-296,-202]mm · 12 of 22 slices shown, 15 images]
[im 2/22  mediastinal]
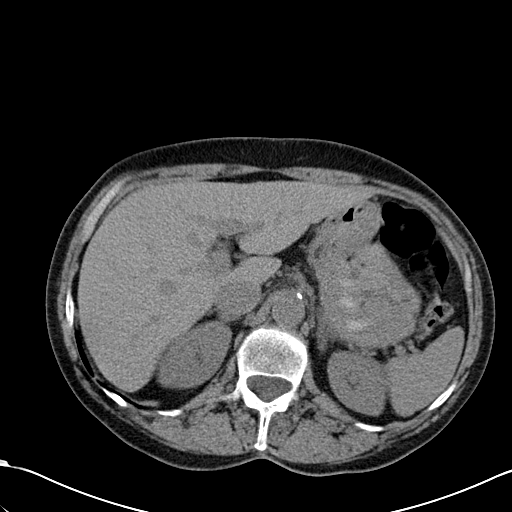
[im 2/22  lung]
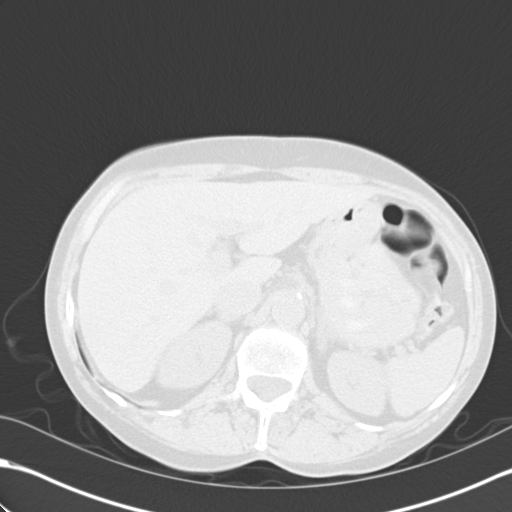
[im 4/22  lung]
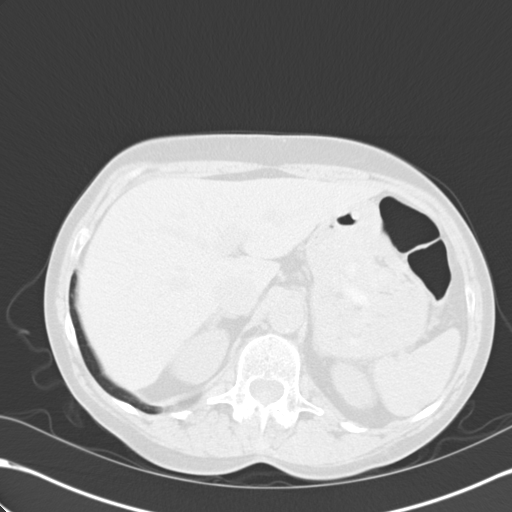
[im 6/22  lung]
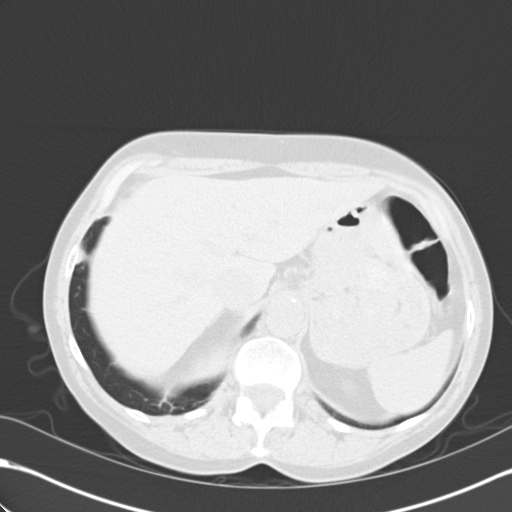
[im 7/22  lung]
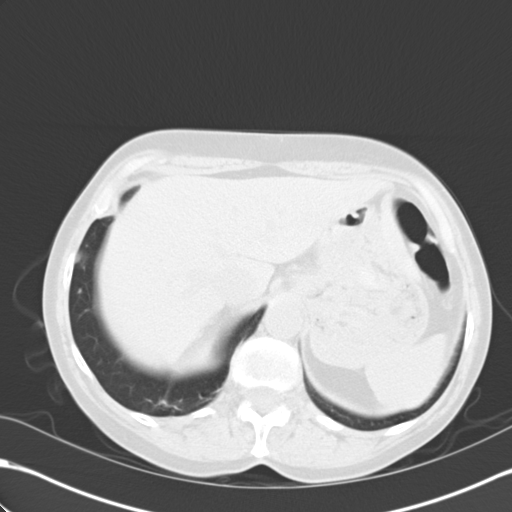
[im 9/22  mediastinal]
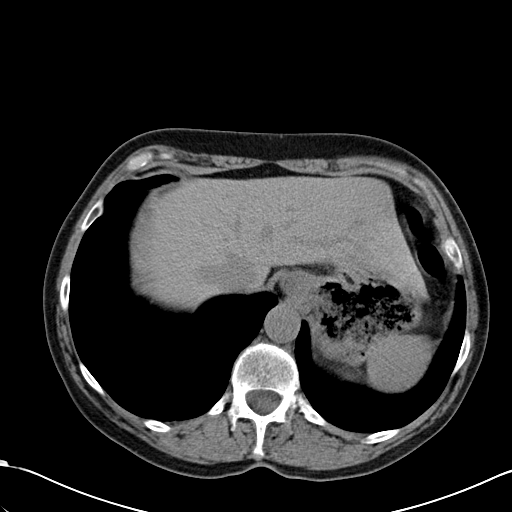
[im 9/22  lung]
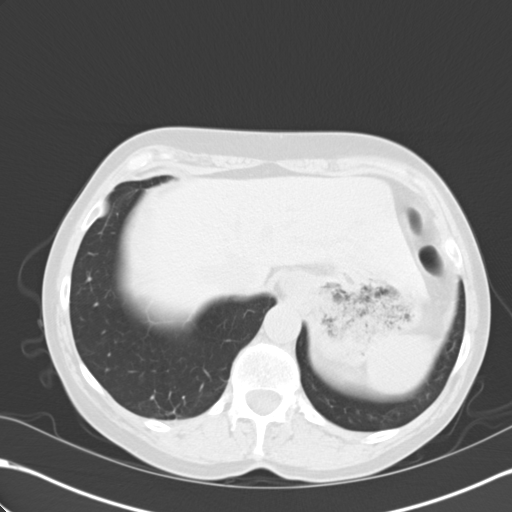
[im 11/22  lung]
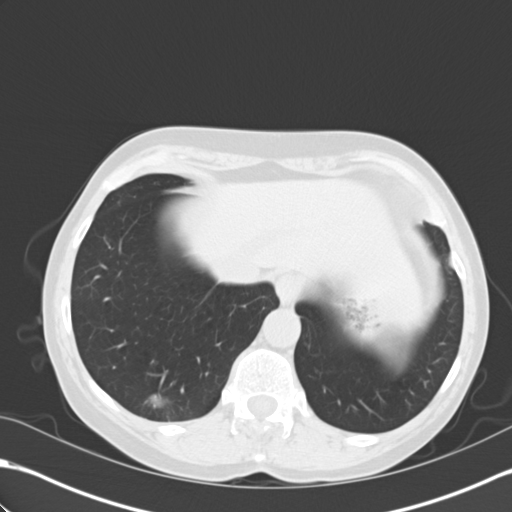
[im 12/22  lung]
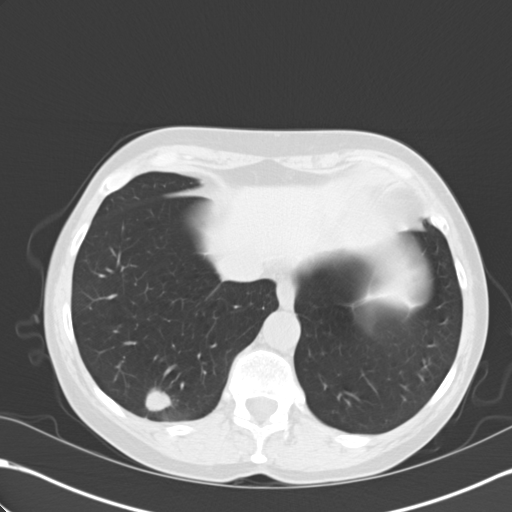
[im 14/22  lung]
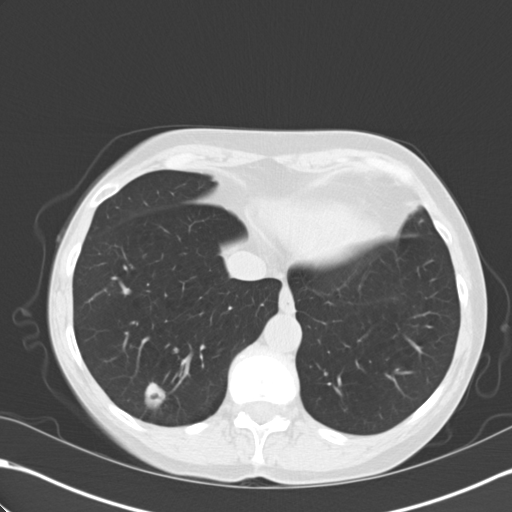
[im 16/22  mediastinal]
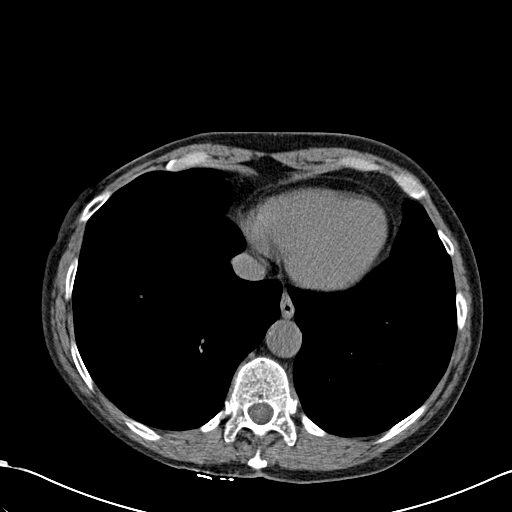
[im 16/22  lung]
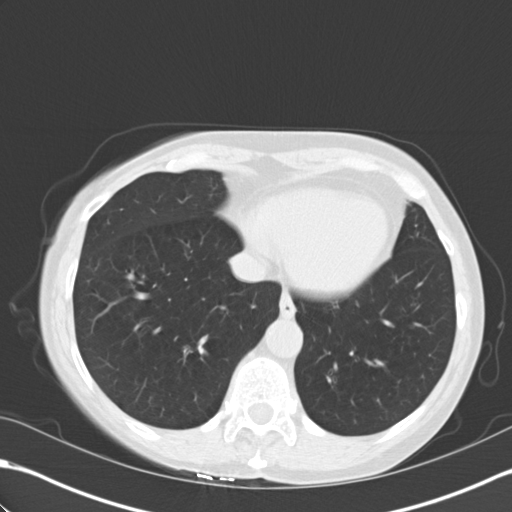
[im 17/22  lung]
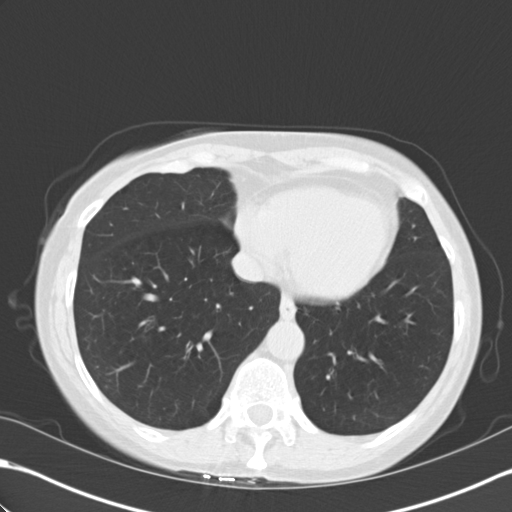
[im 19/22  lung]
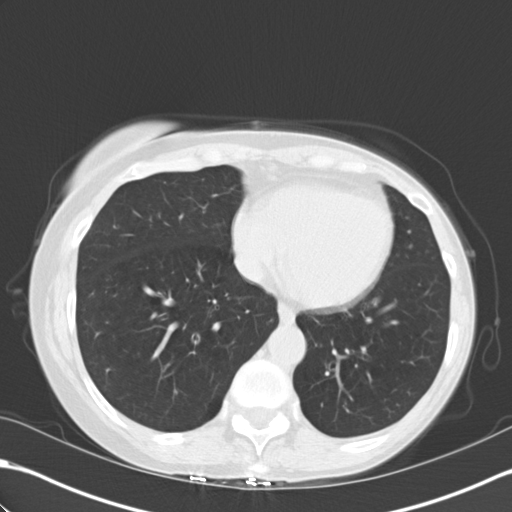
[im 21/22  lung]
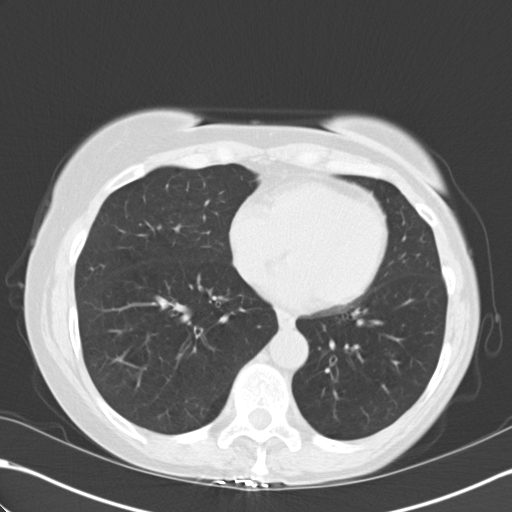

[Series 602: cor · coronal · 0.68mm/px · 3 of 106 slices shown]
[im 22/106  lung]
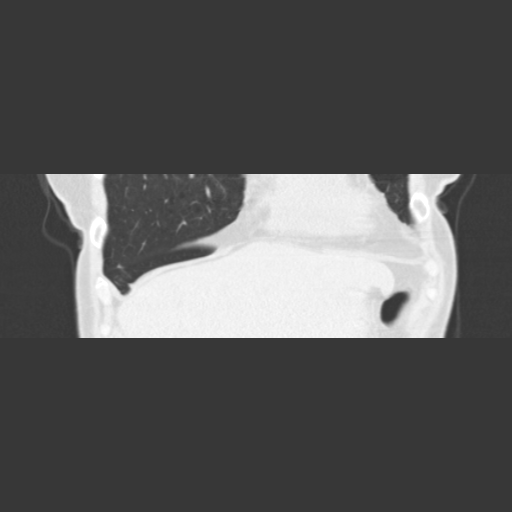
[im 43/106  lung]
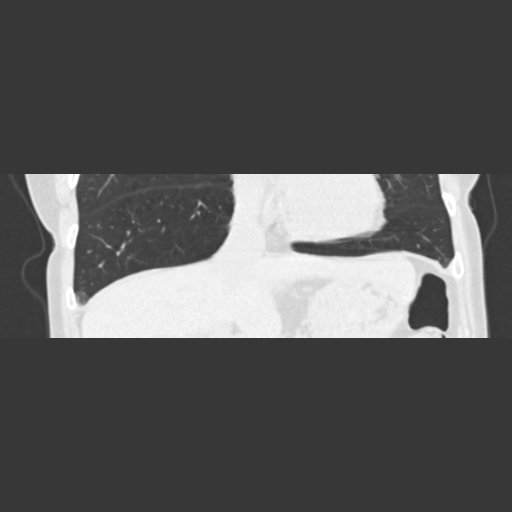
[im 64/106  lung]
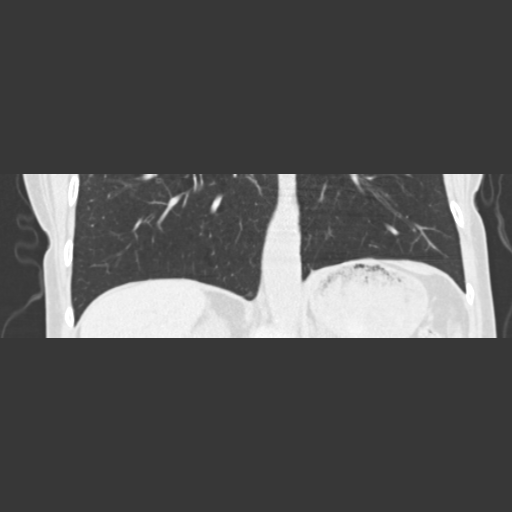

[15 of 30 positions shown; findings below may reference images not displayed]

FINDINGS: Images of the lower lung bases demonstrate stable partially cavitary
nodule posteriorly in right lung base measuring 19 x 18 mm. It does
not appear to be significantly changed in size or appearance
compared to prior exam. The margins of this abnormality are slightly
spiculated. The remaining portions of the visualized lung bases
appear normal. No definite effusion or pneumothorax is seen within
the visualized lung fields. Most of the mediastinum is not included
in the field of view on these limited images. No visualized osseous
abnormality is noted. Visualized portion of upper abdomen appears
normal.
IMPRESSION: No significant change in size or appearance involving partially
cavitary rounded nodule posteriorly in right lung base. Given the
lack of change, this is concerning for possible neoplasm and further
evaluation with PET scan is recommended.

## 2016-10-31 ENCOUNTER — Emergency Department (HOSPITAL_COMMUNITY): Payer: MEDICARE

## 2016-10-31 ENCOUNTER — Inpatient Hospital Stay (HOSPITAL_COMMUNITY)
Admission: EM | Admit: 2016-10-31 | Discharge: 2016-11-03 | DRG: 190 | Disposition: A | Discharge status: Home / Self Care | Payer: MEDICARE | Attending: Internal Medicine | Admitting: Internal Medicine

## 2016-10-31 ENCOUNTER — Encounter (HOSPITAL_COMMUNITY): Payer: Self-pay | Admitting: *Deleted

## 2016-10-31 DIAGNOSIS — Z86718 Personal history of other venous thrombosis and embolism: Secondary | ICD-10-CM | POA: Diagnosis not present

## 2016-10-31 DIAGNOSIS — F17210 Nicotine dependence, cigarettes, uncomplicated: Secondary | ICD-10-CM | POA: Diagnosis present

## 2016-10-31 DIAGNOSIS — I10 Essential (primary) hypertension: Secondary | ICD-10-CM | POA: Diagnosis not present

## 2016-10-31 DIAGNOSIS — J9621 Acute and chronic respiratory failure with hypoxia: Secondary | ICD-10-CM

## 2016-10-31 DIAGNOSIS — J9601 Acute respiratory failure with hypoxia: Secondary | ICD-10-CM | POA: Diagnosis not present

## 2016-10-31 DIAGNOSIS — Z96641 Presence of right artificial hip joint: Secondary | ICD-10-CM | POA: Diagnosis present

## 2016-10-31 DIAGNOSIS — Z79899 Other long term (current) drug therapy: Secondary | ICD-10-CM

## 2016-10-31 DIAGNOSIS — Z8249 Family history of ischemic heart disease and other diseases of the circulatory system: Secondary | ICD-10-CM

## 2016-10-31 DIAGNOSIS — G894 Chronic pain syndrome: Secondary | ICD-10-CM | POA: Diagnosis not present

## 2016-10-31 DIAGNOSIS — Z7951 Long term (current) use of inhaled steroids: Secondary | ICD-10-CM

## 2016-10-31 DIAGNOSIS — J9611 Chronic respiratory failure with hypoxia: Secondary | ICD-10-CM | POA: Diagnosis present

## 2016-10-31 DIAGNOSIS — J441 Chronic obstructive pulmonary disease with (acute) exacerbation: Secondary | ICD-10-CM | POA: Diagnosis not present

## 2016-10-31 DIAGNOSIS — R0902 Hypoxemia: Secondary | ICD-10-CM | POA: Diagnosis present

## 2016-10-31 DIAGNOSIS — F419 Anxiety disorder, unspecified: Secondary | ICD-10-CM | POA: Diagnosis present

## 2016-10-31 LAB — CBC WITH DIFFERENTIAL/PLATELET
BASOS ABS: 0 10*3/uL (ref 0.0–0.1)
BASOS PCT: 0 %
Eosinophils Absolute: 0 10*3/uL (ref 0.0–0.7)
Eosinophils Relative: 0 %
HCT: 42.5 % (ref 36.0–46.0)
Hemoglobin: 14.2 g/dL (ref 12.0–15.0)
Lymphocytes Relative: 6 %
Lymphs Abs: 1 10*3/uL (ref 0.7–4.0)
MCH: 30.9 pg (ref 26.0–34.0)
MCHC: 33.4 g/dL (ref 30.0–36.0)
MCV: 92.6 fL (ref 78.0–100.0)
MONO ABS: 1 10*3/uL (ref 0.1–1.0)
Monocytes Relative: 5 %
Neutro Abs: 15.8 10*3/uL — ABNORMAL HIGH (ref 1.7–7.7)
Neutrophils Relative %: 89 %
PLATELETS: 145 10*3/uL — AB (ref 150–400)
RBC: 4.59 MIL/uL (ref 3.87–5.11)
RDW: 13.6 % (ref 11.5–15.5)
WBC: 17.8 10*3/uL — ABNORMAL HIGH (ref 4.0–10.5)

## 2016-10-31 LAB — COMPREHENSIVE METABOLIC PANEL
ALT: 11 U/L — ABNORMAL LOW (ref 14–54)
ANION GAP: 8 (ref 5–15)
AST: 17 U/L (ref 15–41)
Albumin: 3.3 g/dL — ABNORMAL LOW (ref 3.5–5.0)
Alkaline Phosphatase: 80 U/L (ref 38–126)
BUN: 10 mg/dL (ref 6–20)
CO2: 27 mmol/L (ref 22–32)
Calcium: 8.9 mg/dL (ref 8.9–10.3)
Chloride: 100 mmol/L — ABNORMAL LOW (ref 101–111)
Creatinine, Ser: 0.63 mg/dL (ref 0.44–1.00)
GFR calc Af Amer: >60 mL/min (ref >60)
GLUCOSE: 136 mg/dL — AB (ref 65–99)
POTASSIUM: 3.6 mmol/L (ref 3.5–5.1)
Sodium: 135 mmol/L (ref 135–145)
Total Bilirubin: 0.8 mg/dL (ref 0.3–1.2)
Total Protein: 7.2 g/dL (ref 6.5–8.1)

## 2016-10-31 LAB — TROPONIN I: Troponin I: <0.03 ng/mL (ref <0.03)

## 2016-10-31 LAB — D-DIMER, QUANTITATIVE (NOT AT ARMC): D DIMER QUANT: 3.7 ug{FEU}/mL — AB (ref 0.00–0.50)

## 2016-10-31 MED ORDER — ALPRAZOLAM 0.5 MG PO TABS
0.5000 mg | ORAL_TABLET | Freq: Three times a day (TID) | ORAL | Status: DC | PRN
  Filled 2016-10-31: qty 1

## 2016-10-31 MED ORDER — AZITHROMYCIN 250 MG PO TABS
500.0000 mg | ORAL_TABLET | Freq: Every day | ORAL | Status: AC
  Administered 2016-11-01: 500 mg via ORAL
  Filled 2016-10-31: qty 2

## 2016-10-31 MED ORDER — ALBUTEROL SULFATE (2.5 MG/3ML) 0.083% IN NEBU: 2.5000 mg | INHALATION_SOLUTION | Freq: Four times a day (QID) | RESPIRATORY_TRACT | Status: DC

## 2016-10-31 MED ORDER — IPRATROPIUM-ALBUTEROL 0.5-2.5 (3) MG/3ML IN SOLN
3.0000 mL | RESPIRATORY_TRACT | Status: DC
  Administered 2016-10-31: 3 mL via RESPIRATORY_TRACT
  Filled 2016-10-31: qty 3

## 2016-10-31 MED ORDER — AZITHROMYCIN 250 MG PO TABS
250.0000 mg | ORAL_TABLET | Freq: Every day | ORAL | Status: DC
  Administered 2016-11-02 – 2016-11-03 (×2): 250 mg via ORAL
  Filled 2016-10-31 (×2): qty 1

## 2016-10-31 MED ORDER — SODIUM CHLORIDE 0.9 % IV BOLUS (SEPSIS)
1000.0000 mL | Freq: Once | INTRAVENOUS | Status: AC
  Administered 2016-10-31: 1000 mL via INTRAVENOUS

## 2016-10-31 MED ORDER — IPRATROPIUM BROMIDE 0.02 % IN SOLN
1.0000 mg | Freq: Once | RESPIRATORY_TRACT | Status: AC
  Administered 2016-10-31: 1 mg via RESPIRATORY_TRACT
  Filled 2016-10-31: qty 5

## 2016-10-31 MED ORDER — GUAIFENESIN ER 600 MG PO TB12
600.0000 mg | ORAL_TABLET | Freq: Two times a day (BID) | ORAL | Status: DC
  Administered 2016-11-01 (×2): 600 mg via ORAL
  Filled 2016-10-31 (×2): qty 1

## 2016-10-31 MED ORDER — SODIUM CHLORIDE 0.9% FLUSH: 3.0000 mL | INTRAVENOUS | Status: DC | PRN

## 2016-10-31 MED ORDER — ENOXAPARIN SODIUM 40 MG/0.4ML ~~LOC~~ SOLN
40.0000 mg | SUBCUTANEOUS | Status: DC
Start: 2016-11-01 — End: 2016-11-03
  Filled 2016-10-31: qty 0.4

## 2016-10-31 MED ORDER — LISINOPRIL 10 MG PO TABS: 10.0000 mg | ORAL_TABLET | Freq: Every day | ORAL | Status: DC | PRN

## 2016-10-31 MED ORDER — METHYLPREDNISOLONE SODIUM SUCC 125 MG IJ SOLR
80.0000 mg | Freq: Two times a day (BID) | INTRAMUSCULAR | Status: DC
  Administered 2016-11-01 – 2016-11-03 (×5): 80 mg via INTRAVENOUS
  Filled 2016-10-31 (×5): qty 2

## 2016-10-31 MED ORDER — SODIUM CHLORIDE 0.9% FLUSH
3.0000 mL | Freq: Two times a day (BID) | INTRAVENOUS | Status: DC
  Administered 2016-11-01 – 2016-11-03 (×5): 3 mL via INTRAVENOUS

## 2016-10-31 MED ORDER — METHYLPREDNISOLONE SODIUM SUCC 125 MG IJ SOLR
125.0000 mg | Freq: Once | INTRAMUSCULAR | Status: AC
  Administered 2016-10-31: 125 mg via INTRAVENOUS
  Filled 2016-10-31: qty 2

## 2016-10-31 MED ORDER — ALBUTEROL SULFATE (2.5 MG/3ML) 0.083% IN NEBU: 2.5000 mg | INHALATION_SOLUTION | RESPIRATORY_TRACT | Status: DC | PRN

## 2016-10-31 MED ORDER — SODIUM CHLORIDE 0.9 % IV SOLN: 250.0000 mL | INTRAVENOUS | Status: DC | PRN

## 2016-10-31 MED ORDER — IOPAMIDOL (ISOVUE-370) INJECTION 76%
100.0000 mL | Freq: Once | INTRAVENOUS | Status: AC | PRN
  Administered 2016-10-31: 100 mL via INTRAVENOUS

## 2016-10-31 MED ORDER — DEXTROSE 5 % IV SOLN
500.0000 mg | Freq: Once | INTRAVENOUS | Status: AC
  Administered 2016-10-31: 500 mg via INTRAVENOUS
  Filled 2016-10-31: qty 500

## 2016-10-31 MED ORDER — ALBUTEROL (5 MG/ML) CONTINUOUS INHALATION SOLN
10.0000 mg/h | INHALATION_SOLUTION | RESPIRATORY_TRACT | Status: DC
  Administered 2016-10-31: 10 mg/h via RESPIRATORY_TRACT
  Filled 2016-10-31: qty 20

## 2016-10-31 MED ORDER — IPRATROPIUM-ALBUTEROL 0.5-2.5 (3) MG/3ML IN SOLN
0.5000 mg | Freq: Four times a day (QID) | RESPIRATORY_TRACT | Status: DC
  Filled 2016-10-31: qty 3

## 2016-10-31 NOTE — H&P (Signed)
History and Physical    Teresa Edwards WUJ:[id number] DOB: 11-May-1951 DOA: 10/31/2016  PCP: Samuel Jester, DO  Patient coming from: home, actually urgent care  Chief Complaint:   sob  HPI: Teresa Edwards is a 66 y.o. female with medical history significant of DVT, COPD, tobacco abuse comes in with sudden progressive sob that occurred earlier this afternoon.  She was taking her usual breathing inhalers at home and this was not helping.  She denies any chest pain or cough or fevers.  No swelling in her legs.   She went to urgent care where she was found to be wheezing and hypoxic with sats in the low 80s.  She was sent to the ED due to the severity of her sob, and hypoxia.  She has been given solumedrol, azithro and several nebs and she is feeling much better.  She has normal oxygen sats on 2 liters now.  She denies any recent exposures or illnesses.  She last smoked yesterday.   Review of Systems: As per HPI otherwise 10 point review of systems negative.   Past Medical History:  Diagnosis Date  . COPD (chronic obstructive pulmonary disease) (HCC)   . DDD (degenerative disc disease)   . DJD (degenerative joint disease)   . DVT femoral (deep venous thrombosis) with thrombophlebitis [facility])     Past Surgical History:  Procedure Laterality Date  . TOTAL HIP ARTHROPLASTY Right   . TUBAL LIGATION  10/1971     reports that she has been smoking Cigarettes.  She has a 40.00 pack-year smoking history. She has never used smokeless tobacco. She reports that she does not drink alcohol or use drugs.  No Known Allergies  Family History  Problem Relation Age of Onset  . Deep vein thrombosis Mother   . Other Mother     varicose veins  . Hypertension Sister   . Other Sister     varicose veins  . Cancer Brother     Prior to Admission medications   Medication Sig Start Date End Date Taking? Authorizing Provider  ALPRAZolam Prudy Feeler) 0.5 MG tablet Take 0.5 mg by mouth 3 (three) times  daily as needed for anxiety.    Yes Historical Provider, MD  budesonide-formoterol (SYMBICORT) 160-4.5 MCG/ACT inhaler Inhale 2 puffs into the lungs 2 (two) times daily.   Yes Historical Provider, MD  lisinopril (PRINIVIL,ZESTRIL) 10 MG tablet Take 10 mg by mouth daily as needed (for blood pressue).  08/10/16  Yes Historical Provider, MD  oxycodone (ROXICODONE) 30 MG immediate release tablet Take 15-30 mg by mouth every 4 (four) hours as needed for pain.    Yes Historical Provider, MD    Physical Exam: Vitals:   10/31/16 2225 10/31/16 2230 10/31/16 2239 10/31/16 2300  BP:  131/75  134/63  Pulse:  (!) 111  (!) 104  Resp:  (!) 21  20  Temp:   98.9 F (37.2 C)   TempSrc:   Oral   SpO2: 94% 93%  92%  Weight:      Height:         Constitutional: NAD, calm, comfortable Vitals:   10/31/16 2225 10/31/16 2230 10/31/16 2239 10/31/16 2300  BP:  131/75  134/63  Pulse:  (!) 111  (!) 104  Resp:  (!) 21  20  Temp:   98.9 F (37.2 C)   TempSrc:   Oral   SpO2: 94% 93%  92%  Weight:      Height:  Eyes: PERRL, lids and conjunctivae normal ENMT: Mucous membranes are moist. Posterior pharynx clear of any exudate or lesions.Normal dentition.  Neck: normal, supple, no masses, no thyromegaly Respiratory: diminished auscultation bilaterally, no wheezing, no crackles. Normal respiratory effort. No accessory muscle use.  Cardiovascular: Regular rate and rhythm, no murmurs / rubs / gallops. No extremity edema. 2+ pedal pulses. No carotid bruits.  Abdomen: no tenderness, no masses palpated. No hepatosplenomegaly. Bowel sounds positive.  Musculoskeletal: no clubbing / cyanosis. No joint deformity upper and lower extremities. Good ROM, no contractures. Normal muscle tone.  Skin: no rashes, lesions, ulcers. No induration Neurologic: CN 2-12 grossly intact. Sensation intact, DTR normal. Strength 5/5 in all 4.  Psychiatric: Normal judgment and insight. Alert and oriented x 3. Normal mood.    Labs on  Admission: I have personally reviewed following labs and imaging studies  CBC:  Recent Labs Lab 10/31/16 2001  WBC 17.8*  NEUTROABS 15.8*  HGB 14.2  HCT 42.5  MCV 92.6  PLT 145*   Basic Metabolic Panel:  Recent Labs Lab 10/31/16 2001  NA 135  K 3.6  CL 100*  CO2 27  GLUCOSE 136*  BUN 10  CREATININE 0.63  CALCIUM 8.9   GFR: Estimated Creatinine Clearance: 61.9 mL/min (by C-G formula based on SCr of 0.63 mg/dL). Liver Function Tests:  Recent Labs Lab 10/31/16 2001  AST 17  ALT 11*  ALKPHOS 80  BILITOT 0.8  PROT 7.2  ALBUMIN 3.3*   Cardiac Enzymes:  Recent Labs Lab 10/31/16 2001  TROPONINI <0.03   Radiological Exams on Admission: Ct Angio Chest Pe W And/or Wo Contrast  Result Date: 10/31/2016 CLINICAL DATA:  Acute onset of shortness of breath. Decreased O2 saturation. Initial encounter. EXAM: CT ANGIOGRAPHY CHEST WITH CONTRAST TECHNIQUE: Multidetector CT imaging of the chest was performed using the standard protocol during bolus administration of intravenous contrast. Multiplanar CT image reconstructions and MIPs were obtained to evaluate the vascular anatomy. CONTRAST:  100 mL of Isovue 370 IV contrast COMPARISON:  Chest radiograph performed 06/16/2016, and PET/CT performed 12/29/2014 FINDINGS: Cardiovascular:  There is no evidence of pulmonary embolus. The heart is normal in size. Scattered calcification is seen along the aortic arch. The great vessels are grossly unremarkable in appearance. Mediastinum/Nodes: A mildly prominent 1.2 cm right hilar node is noted. No mediastinal lymphadenopathy is seen. No pericardial effusion is identified. The thyroid gland is unremarkable. No axillary lymphadenopathy is appreciated. Lungs/Pleura: Diffuse tree-in-bud nodular opacities are noted throughout both lungs, with some degree of sparing at the lung apices. This is concerning for an acute atypical infectious or inflammatory process. Spiculation at the right lung base is  thought to reflect scarring, given the prior cavitary nodule in this location. The previously noted nodule has resolved. Scarring is noted at the right lung apex. No pleural effusion or pneumothorax is seen. Upper Abdomen: The visualized portions of the liver and spleen are unremarkable. Musculoskeletal: No acute osseous abnormalities are identified. The visualized musculature is unremarkable in appearance. A 1.7 cm nodule is noted at the 11 o'clock position of the left breast; this is stable from 2016 and likely benign. Review of the MIP images confirms the above findings. IMPRESSION: 1. No evidence of pulmonary embolus. 2. Diffuse tree-in-bud nodular opacities throughout both lungs, with some degree of sparing at the lung apices. This is concerning for an acute atypical infectious or inflammatory process. 3. Spiculation at the right lung base is thought reflect scarring, given the prior cavitary nodule in dislocation. The  previously noted nodule has resolved. 4. Scarring at the right lung apex. 5. Mildly prominent 1.2 cm right hilar node may reflect the underlying infection. Electronically Signed   By: Roanna Raider M.D.   On: 10/31/2016 22:07    EKG: Independently reviewed. Sinus tachy no acute issues Old chart reviewed Case discussed with EDP  Assessment/Plan 66 yo female with acute hypoxic respiratory failure secondary to copde  Principal Problem:   COPD exacerbation (HCC)  Iv solumedrol, freq nebs.  zpack.  Supplemental oxygen and wean as tolerates.  Active Problems:   Acute respiratory failure with hypoxia (HCC)  Due to above, treat copd   History of DVT (deep vein thrombosis)  Pt reports this was years ago and was treated with xaralto for 2 years unknown why so long, she denies any recurrence of this or PE    DVT prophylaxis:  lovenox Code Status:  full Family Communication:  none Disposition Plan:  Per day team Consults called:  none Admission status:   Full admission due to new  oxygen needs and degree of hypoxia on RA   DAVID,RACHAL A MD [facility]  If 7PM-7AM, please contact night-coverage [email or web address] Password [facility]  10/31/2016, 11:26 PM

## 2016-10-31 NOTE — ED Provider Notes (Signed)
[facility]-EMERGENCY DEPT Provider Note   CSN: [id number] Arrival date & time: 10/31/16  1937     History   Chief Complaint Chief Complaint  Patient presents with  . Shortness of Breath    HPI Teresa Edwards is a 66 y.o. female.   Shortness of Breath  This is a new problem. The average episode lasts 4 hours. The problem occurs continuously.The current episode started 3 to 5 hours ago. The problem has been gradually worsening. Associated symptoms include cough, sputum production and wheezing. Pertinent negatives include no fever, no orthopnea and no vomiting.    Past Medical History:  Diagnosis Date  . COPD (chronic obstructive pulmonary disease) (HCC)   . DDD (degenerative disc disease)   . DJD (degenerative joint disease)   . DVT femoral (deep venous thrombosis) with thrombophlebitis [facility])     Patient Active Problem List   Diagnosis Date Noted  . Cavitating mass in right lower lung lobe 08/31/2014  . Pain in limb 10/29/2013    Past Surgical History:  Procedure Laterality Date  . TOTAL HIP ARTHROPLASTY Right   . TUBAL LIGATION  10/1971    OB History    No data available       Home Medications    Prior to Admission medications   Medication Sig Start Date End Date Taking? Authorizing Provider  ALPRAZolam Prudy Feeler) 0.5 MG tablet Take 0.5 mg by mouth 3 (three) times daily as needed for anxiety.    Yes Historical Provider, MD  budesonide-formoterol (SYMBICORT) 160-4.5 MCG/ACT inhaler Inhale 2 puffs into the lungs 2 (two) times daily.   Yes Historical Provider, MD  lisinopril (PRINIVIL,ZESTRIL) 10 MG tablet Take 10 mg by mouth daily as needed (for blood pressue).  08/10/16  Yes Historical Provider, MD  oxycodone (ROXICODONE) 30 MG immediate release tablet Take 15-30 mg by mouth every 4 (four) hours as needed for pain.    Yes Historical Provider, MD    Family History Family History  Problem Relation Age of Onset  . Deep vein thrombosis Mother   . Other Mother    varicose veins  . Hypertension Sister   . Other Sister     varicose veins  . Cancer Brother     Social History Social History  Substance Use Topics  . Smoking status: Current Every Day Smoker    Packs/day: 1.00    Years: 40.00    Types: Cigarettes  . Smokeless tobacco: Never Used     Comment: 3-4 cigs daily 11/13/14  . Alcohol use No     Allergies   Patient has no known allergies.   Review of Systems Review of Systems  Constitutional: Negative for fever.  Respiratory: Positive for cough, sputum production, shortness of breath and wheezing.   Cardiovascular: Negative for orthopnea.  Gastrointestinal: Negative for vomiting.  All other systems reviewed and are negative.    Physical Exam Updated Vital Signs BP 132/75   Pulse (!) 123   Resp 20   Ht 5' 4.5" (1.638 m)   Wt 133 lb (60.3 kg)   SpO2 94%   BMI 22.48 kg/m   Physical Exam  Constitutional: She is oriented to person, place, and time. She appears well-developed and well-nourished.  HENT:  Head: Normocephalic and atraumatic.  Eyes: Conjunctivae and EOM are normal.  Neck: Normal range of motion.  Cardiovascular: Regular rhythm.  Tachycardia present.   Pulmonary/Chest: No stridor. Tachypnea noted. No respiratory distress. She has decreased breath sounds. She has wheezes. She has no rhonchi.  She has no rales.  Abdominal: Soft. She exhibits no distension.  Musculoskeletal: She exhibits no edema or deformity.  Neurological: She is alert and oriented to person, place, and time. Coordination normal.  Skin: Skin is warm and dry.  Nursing note and vitals reviewed.    ED Treatments / Results  Labs (all labs ordered are listed, but only abnormal results are displayed) Labs Reviewed  CBC WITH DIFFERENTIAL/PLATELET - Abnormal; Notable for the following:       Result Value   WBC 17.8 (*)    Platelets 145 (*)    Neutro Abs 15.8 (*)    All other components within normal limits  COMPREHENSIVE METABOLIC PANEL -  Abnormal; Notable for the following:    Chloride 100 (*)    Glucose, Bld 136 (*)    Albumin 3.3 (*)    ALT 11 (*)    All other components within normal limits  D-DIMER, QUANTITATIVE (NOT AT [facility]) - Abnormal; Notable for the following:    D-Dimer, Quant 3.70 (*)    All other components within normal limits  TROPONIN I    EKG  EKG Interpretation  Date/Time:  Tuesday October 31 2016 19:51:55 EDT Ventricular Rate:  117 PR Interval:    QRS Duration: 81 QT Interval:  340 QTC Calculation: 475 R Axis:   67 Text Interpretation:  Sinus tachycardia Right atrial enlargement Minimal ST depression, diffuse leads poor baseline, will need repeated No old tracing to compare Confirmed by [facility] MD, Barbara Cower [phone]) on 10/31/2016 8:03:21 PM       Radiology Ct Angio Chest Pe W And/or Wo Contrast  Result Date: 10/31/2016 CLINICAL DATA:  Acute onset of shortness of breath. Decreased O2 saturation. Initial encounter. EXAM: CT ANGIOGRAPHY CHEST WITH CONTRAST TECHNIQUE: Multidetector CT imaging of the chest was performed using the standard protocol during bolus administration of intravenous contrast. Multiplanar CT image reconstructions and MIPs were obtained to evaluate the vascular anatomy. CONTRAST:  100 mL of Isovue 370 IV contrast COMPARISON:  Chest radiograph performed 06/16/2016, and PET/CT performed 12/29/2014 FINDINGS: Cardiovascular:  There is no evidence of pulmonary embolus. The heart is normal in size. Scattered calcification is seen along the aortic arch. The great vessels are grossly unremarkable in appearance. Mediastinum/Nodes: A mildly prominent 1.2 cm right hilar node is noted. No mediastinal lymphadenopathy is seen. No pericardial effusion is identified. The thyroid gland is unremarkable. No axillary lymphadenopathy is appreciated. Lungs/Pleura: Diffuse tree-in-bud nodular opacities are noted throughout both lungs, with some degree of sparing at the lung apices. This is concerning for an acute  atypical infectious or inflammatory process. Spiculation at the right lung base is thought to reflect scarring, given the prior cavitary nodule in this location. The previously noted nodule has resolved. Scarring is noted at the right lung apex. No pleural effusion or pneumothorax is seen. Upper Abdomen: The visualized portions of the liver and spleen are unremarkable. Musculoskeletal: No acute osseous abnormalities are identified. The visualized musculature is unremarkable in appearance. A 1.7 cm nodule is noted at the 11 o'clock position of the left breast; this is stable from 2016 and likely benign. Review of the MIP images confirms the above findings. IMPRESSION: 1. No evidence of pulmonary embolus. 2. Diffuse tree-in-bud nodular opacities throughout both lungs, with some degree of sparing at the lung apices. This is concerning for an acute atypical infectious or inflammatory process. 3. Spiculation at the right lung base is thought reflect scarring, given the prior cavitary nodule in dislocation. The previously noted  nodule has resolved. 4. Scarring at the right lung apex. 5. Mildly prominent 1.2 cm right hilar node may reflect the underlying infection. Electronically Signed   By: Roanna Raider M.D.   On: 10/31/2016 22:07    Procedures Procedures (including critical care time)  CRITICAL CARE Performed by: Marily Memos Total critical care time: 37 minutes Critical care time was exclusive of separately billable procedures and treating other patients. Critical care was necessary to treat or prevent imminent or life-threatening deterioration. Critical care was time spent personally by me on the following activities: development of treatment plan with patient and/or surrogate as well as nursing, discussions with consultants, evaluation of patient's response to treatment, examination of patient, obtaining history from patient or surrogate, ordering and performing treatments and interventions, ordering  and review of laboratory studies, ordering and review of radiographic studies, pulse oximetry and re-evaluation of patient's condition.   Medications Ordered in ED Medications  albuterol (PROVENTIL,VENTOLIN) solution continuous neb (10 mg/hr Nebulization New Bag/Given 10/31/16 2000)  azithromycin (ZITHROMAX) 500 mg in dextrose 5 % 250 mL IVPB (500 mg Intravenous New Bag/Given 10/31/16 2025)  ipratropium (ATROVENT) nebulizer solution 1 mg (1 mg Nebulization Given 10/31/16 2000)  methylPREDNISolone sodium succinate (SOLU-MEDROL) 125 mg/2 mL injection 125 mg (125 mg Intravenous Given 10/31/16 2027)     Initial Impression / Assessment and Plan / ED Course  I have reviewed the triage vital signs and the nursing notes.  Pertinent labs & imaging results that were available during my care of the patient were reviewed by me and considered in my medical decision making (see chart for details).    Likely copd exacerbation but has h/o DVT and this worsened acutely, so will check d dimer as well.  Hypoxic so will likely need admission.  ECG without acute ischemia, no bump in troponin, doubt ischemia.  PE study negative but did show atypical infection. Still hypoxic on room air even at rest (as low as 88% before reapplying oxygen) so will admit for oxygen, further treatment and management.   Final Clinical Impressions(s) / ED Diagnoses   Final diagnoses:  Hypoxia  COPD exacerbation (HCC)  Acute on chronic respiratory failure with hypoxia [facility])      Marily Memos, MD 11/01/16 1723

## 2016-10-31 NOTE — ED Triage Notes (Signed)
Pt c/o cob that started today around 2pm; pt states she went to urgent care and her O2 sats were in the mid 80's. Pt was given a duoneb at urgent care and ems was called; pt denies any pain

## 2016-11-01 DIAGNOSIS — I10 Essential (primary) hypertension: Secondary | ICD-10-CM | POA: Diagnosis not present

## 2016-11-01 DIAGNOSIS — J9601 Acute respiratory failure with hypoxia: Secondary | ICD-10-CM | POA: Diagnosis not present

## 2016-11-01 DIAGNOSIS — G894 Chronic pain syndrome: Secondary | ICD-10-CM | POA: Diagnosis not present

## 2016-11-01 DIAGNOSIS — F419 Anxiety disorder, unspecified: Secondary | ICD-10-CM | POA: Diagnosis not present

## 2016-11-01 DIAGNOSIS — R0902 Hypoxemia: Secondary | ICD-10-CM | POA: Diagnosis not present

## 2016-11-01 DIAGNOSIS — J9621 Acute and chronic respiratory failure with hypoxia: Secondary | ICD-10-CM | POA: Diagnosis not present

## 2016-11-01 DIAGNOSIS — J441 Chronic obstructive pulmonary disease with (acute) exacerbation: Secondary | ICD-10-CM | POA: Diagnosis not present

## 2016-11-01 MED ORDER — IPRATROPIUM-ALBUTEROL 0.5-2.5 (3) MG/3ML IN SOLN
3.0000 mL | Freq: Four times a day (QID) | RESPIRATORY_TRACT | Status: DC
  Administered 2016-11-01 – 2016-11-03 (×10): 3 mL via RESPIRATORY_TRACT
  Filled 2016-11-01 (×9): qty 3

## 2016-11-01 MED ORDER — GUAIFENESIN ER 600 MG PO TB12
1200.0000 mg | ORAL_TABLET | Freq: Two times a day (BID) | ORAL | Status: DC
  Administered 2016-11-01 – 2016-11-03 (×4): 1200 mg via ORAL
  Filled 2016-11-01 (×4): qty 2

## 2016-11-01 NOTE — Care Management Note (Signed)
Case Management Note  Patient Details  Name: Teresa Edwards MRN: [id number] Date of Birth: 10-Sep-1950  Subjective/Objective:                  Pt is from home, lives with her grandson and his girlfriend. Pt admitted with COPD. She is ind with ADL's. She has PCP, transportation to appointments and no difficulty affording medications. She does not have neb machine or supplemental oxygen pta.   Action/Plan: She plans to return home with self care at DC. CM will cont to follow for DC needs.   Expected Discharge Date:      11/03/2016            Expected Discharge Plan:  Home/Self Care  In-House Referral:  NA  Discharge planning Services  CM Consult  Post Acute Care Choice:  NA Choice offered to:  NA  Status of Service:  In process, will continue to follow  Malcolm Metro, RN 11/01/2016, 1:59 PM

## 2016-11-01 NOTE — Progress Notes (Signed)
PROGRESS NOTE    Teresa Edwards  ZOX:[id number] DOB: 08/12/1950 DOA: 10/31/2016 PCP: Samuel Jester, DO    Brief Narrative:  66 year old female with a history of COPD and tobacco use, presented with progressive shortness of breath and found to have COPD exacerbation. She was admitted for further treatments.  Assessment & Plan:   Principal Problem:   COPD exacerbation (HCC) Active Problems:   Acute respiratory failure with hypoxia (HCC)   History of DVT (deep vein thrombosis)   1. COPD exacerbation. Appears to be improving with steroids, antibiotics and bronchodilators. We'll continue current treatments.  2. Acute respiratory failure with hypoxia. Related to COPD exacerbation. Continue to wean off oxygen as tolerated.  3. History of DVT. Patient reports was years ago she was treated with anticoagulation for 2 years. She does not appear to have any recurrence of DVT or PE at this time.  4. Hypertension. Continue on lisinopril.  5. Anxiety. Continue on home dose of Xanax.   DVT prophylaxis: Lovenox Code Status: Full code Family Communication: No family present Disposition Plan: Discharge home once improved.   Consultants:     Procedures:    Antimicrobials:   Azithromycin 4/17    Subjective: Feels that overall breathing has improved since yesterday. Continues to have mildly productive cough.  Objective: Vitals:   11/01/16 0528 11/01/16 0829 11/01/16 1429 11/01/16 1509  BP: 112/60   134/67  Pulse: 88   91  Resp:    18  Temp: 97.7 F (36.5 C)   97.7 F (36.5 C)  TempSrc: Oral   Oral  SpO2: 97% 90% 98% 96%  Weight:      Height:        Intake/Output Summary (Last 24 hours) at 11/01/16 1855 Last data filed at 10/31/16 2308  Gross per 24 hour  Intake             1250 ml  Output                0 ml  Net             1250 ml   Filed Weights   10/31/16 1939 10/31/16 2326  Weight: 60.3 kg (133 lb) 61.1 kg (134 lb 9.6 oz)    Examination:  General  exam: Appears calm and comfortable  Respiratory system: Fair air movement bilaterally with mild wheeze. Respiratory effort normal. Cardiovascular system: S1 & S2 heard, RRR. No JVD, murmurs, rubs, gallops or clicks. No pedal edema. Gastrointestinal system: Abdomen is nondistended, soft and nontender. No organomegaly or masses felt. Normal bowel sounds heard. Central nervous system: Alert and oriented. No focal neurological deficits. Extremities: Symmetric 5 x 5 power. Skin: No rashes, lesions or ulcers Psychiatry: Judgement and insight appear normal. Mood & affect appropriate.     Data Reviewed: I have personally reviewed following labs and imaging studies  CBC:  Recent Labs Lab 10/31/16 2001  WBC 17.8*  NEUTROABS 15.8*  HGB 14.2  HCT 42.5  MCV 92.6  PLT 145*   Basic Metabolic Panel:  Recent Labs Lab 10/31/16 2001  NA 135  K 3.6  CL 100*  CO2 27  GLUCOSE 136*  BUN 10  CREATININE 0.63  CALCIUM 8.9   GFR: Estimated Creatinine Clearance: 60.5 mL/min (by C-G formula based on SCr of 0.63 mg/dL). Liver Function Tests:  Recent Labs Lab 10/31/16 2001  AST 17  ALT 11*  ALKPHOS 80  BILITOT 0.8  PROT 7.2  ALBUMIN 3.3*   No results for input(s):  LIPASE, AMYLASE in the last 168 hours. No results for input(s): AMMONIA in the last 168 hours. Coagulation Profile: No results for input(s): INR, PROTIME in the last 168 hours. Cardiac Enzymes:  Recent Labs Lab 10/31/16 2001  TROPONINI <0.03   BNP (last 3 results) No results for input(s): PROBNP in the last 8760 hours. HbA1C: No results for input(s): HGBA1C in the last 72 hours. CBG: No results for input(s): GLUCAP in the last 168 hours. Lipid Profile: No results for input(s): CHOL, HDL, LDLCALC, TRIG, CHOLHDL, LDLDIRECT in the last 72 hours. Thyroid Function Tests: No results for input(s): TSH, T4TOTAL, FREET4, T3FREE, THYROIDAB in the last 72 hours. Anemia Panel: No results for input(s): VITAMINB12, FOLATE,  FERRITIN, TIBC, IRON, RETICCTPCT in the last 72 hours. Sepsis Labs: No results for input(s): PROCALCITON, LATICACIDVEN in the last 168 hours.  No results found for this or any previous visit (from the past 240 hour(s)).       Radiology Studies: Ct Angio Chest Pe W And/or Wo Contrast  Result Date: 10/31/2016 CLINICAL DATA:  Acute onset of shortness of breath. Decreased O2 saturation. Initial encounter. EXAM: CT ANGIOGRAPHY CHEST WITH CONTRAST TECHNIQUE: Multidetector CT imaging of the chest was performed using the standard protocol during bolus administration of intravenous contrast. Multiplanar CT image reconstructions and MIPs were obtained to evaluate the vascular anatomy. CONTRAST:  100 mL of Isovue 370 IV contrast COMPARISON:  Chest radiograph performed 06/16/2016, and PET/CT performed 12/29/2014 FINDINGS: Cardiovascular:  There is no evidence of pulmonary embolus. The heart is normal in size. Scattered calcification is seen along the aortic arch. The great vessels are grossly unremarkable in appearance. Mediastinum/Nodes: A mildly prominent 1.2 cm right hilar node is noted. No mediastinal lymphadenopathy is seen. No pericardial effusion is identified. The thyroid gland is unremarkable. No axillary lymphadenopathy is appreciated. Lungs/Pleura: Diffuse tree-in-bud nodular opacities are noted throughout both lungs, with some degree of sparing at the lung apices. This is concerning for an acute atypical infectious or inflammatory process. Spiculation at the right lung base is thought to reflect scarring, given the prior cavitary nodule in this location. The previously noted nodule has resolved. Scarring is noted at the right lung apex. No pleural effusion or pneumothorax is seen. Upper Abdomen: The visualized portions of the liver and spleen are unremarkable. Musculoskeletal: No acute osseous abnormalities are identified. The visualized musculature is unremarkable in appearance. A 1.7 cm nodule is  noted at the 11 o'clock position of the left breast; this is stable from 2016 and likely benign. Review of the MIP images confirms the above findings. IMPRESSION: 1. No evidence of pulmonary embolus. 2. Diffuse tree-in-bud nodular opacities throughout both lungs, with some degree of sparing at the lung apices. This is concerning for an acute atypical infectious or inflammatory process. 3. Spiculation at the right lung base is thought reflect scarring, given the prior cavitary nodule in dislocation. The previously noted nodule has resolved. 4. Scarring at the right lung apex. 5. Mildly prominent 1.2 cm right hilar node may reflect the underlying infection. Electronically Signed   By: Roanna Raider M.D.   On: 10/31/2016 22:07        Scheduled Meds: . [START ON 11/02/2016] azithromycin  250 mg Oral Daily  . enoxaparin (LOVENOX) injection  40 mg Subcutaneous Q24H  . guaiFENesin  1,200 mg Oral BID  . ipratropium-albuterol  3 mL Nebulization Q6H  . methylPREDNISolone (SOLU-MEDROL) injection  80 mg Intravenous Q12H  . sodium chloride flush  3 mL Intravenous Q12H  Continuous Infusions: . sodium chloride       LOS: 1 day    Time spent:54mins    Donica Derouin, MD [facility] Pager [phone]  If 7PM-7AM, please contact night-coverage [email or web address] Password [email or web address] 11/01/2016, 6:55 PM

## 2016-11-02 DIAGNOSIS — G894 Chronic pain syndrome: Secondary | ICD-10-CM

## 2016-11-02 DIAGNOSIS — F419 Anxiety disorder, unspecified: Secondary | ICD-10-CM | POA: Diagnosis not present

## 2016-11-02 DIAGNOSIS — J9621 Acute and chronic respiratory failure with hypoxia: Secondary | ICD-10-CM | POA: Diagnosis not present

## 2016-11-02 DIAGNOSIS — J441 Chronic obstructive pulmonary disease with (acute) exacerbation: Secondary | ICD-10-CM | POA: Diagnosis not present

## 2016-11-02 MED ORDER — OXYCODONE HCL 5 MG PO TABS
15.0000 mg | ORAL_TABLET | Freq: Four times a day (QID) | ORAL | Status: AC | PRN
  Administered 2016-11-02 – 2016-11-03 (×2): 15 mg via ORAL
  Filled 2016-11-02 (×2): qty 3

## 2016-11-02 MED ORDER — BUDESONIDE 0.25 MG/2ML IN SUSP
0.2500 mg | Freq: Two times a day (BID) | RESPIRATORY_TRACT | Status: DC
  Administered 2016-11-02 – 2016-11-03 (×3): 0.25 mg via RESPIRATORY_TRACT
  Filled 2016-11-02 (×3): qty 2

## 2016-11-02 NOTE — Progress Notes (Signed)
Per pt, RT lowered her oxygen to one liter. Pt reports that she is tolerating well.

## 2016-11-02 NOTE — Progress Notes (Signed)
PROGRESS NOTE    Teresa Edwards  ZOX:[id number] DOB: 05-31-1951 DOA: 10/31/2016 PCP: Samuel Jester, DO    Brief Narrative:  66 year old female with a history of COPD and tobacco use, presented with progressive shortness of breath and found to have COPD exacerbation. She was admitted for further treatments.  Assessment & Plan:   Principal Problem:   COPD exacerbation (HCC) Active Problems:   Acute respiratory failure with hypoxia (HCC)   History of DVT (deep vein thrombosis)   HTN (hypertension)   Anxiety   Chronic pain syndrome   1. COPD exacerbation. Continues to feel short of breath. Objectively, appears to be improving. Continue IV steroids for today. Will add inhaled steroids. Continue bronchodilators and antibiotics.  2. Acute respiratory failure with hypoxia. Related to COPD exacerbation. Continue to wean off oxygen as tolerated. May need to discharge on supplemental oxygen and she desaturate on ambulation.  3. History of DVT. Patient reports was years ago she was treated with anticoagulation for 2 years. She does not appear to have any recurrence of DVT or PE at this time.  4. Hypertension. Continue on lisinopril.  5. Anxiety. Continue on home dose of Xanax.   DVT prophylaxis: Lovenox Code Status: Full code Family Communication: No family present Disposition Plan: Discharge home once improved.   Consultants:     Procedures:    Antimicrobials:   Azithromycin 4/17>>    Subjective: Still feels short of breath. Becomes dyspneic on exertion while walking to the bathroom..  Objective: Vitals:   11/02/16 1000 11/02/16 1100 11/02/16 1320 11/02/16 1408  BP:    (!) 145/75  Pulse:    (!) 115  Resp:    18  Temp:    98.2 F (36.8 C)  TempSrc:    Oral  SpO2: 97% 94% 93% 92%  Weight:      Height:        Intake/Output Summary (Last 24 hours) at 11/02/16 1606 Last data filed at 11/02/16 1300  Gross per 24 hour  Intake              723 ml  Output                 0 ml  Net              723 ml   Filed Weights   10/31/16 1939 10/31/16 2326  Weight: 60.3 kg (133 lb) 61.1 kg (134 lb 9.6 oz)    Examination:  General exam: Alert, awake, oriented x 3 Respiratory system: Diminished breath sounds with mild wheeze bilaterally Cardiovascular system:RRR. No murmurs, rubs, gallops. Gastrointestinal system: Abdomen is nondistended, soft and nontender. No organomegaly or masses felt. Normal bowel sounds heard. Central nervous system: Alert and oriented. No focal neurological deficits. Extremities: No C/C/E, +pedal pulses Skin: No rashes, lesions or ulcers Psychiatry: Judgement and insight appear normal. Mood & affect appropriate.      Data Reviewed: I have personally reviewed following labs and imaging studies  CBC:  Recent Labs Lab 10/31/16 2001  WBC 17.8*  NEUTROABS 15.8*  HGB 14.2  HCT 42.5  MCV 92.6  PLT 145*   Basic Metabolic Panel:  Recent Labs Lab 10/31/16 2001  NA 135  K 3.6  CL 100*  CO2 27  GLUCOSE 136*  BUN 10  CREATININE 0.63  CALCIUM 8.9   GFR: Estimated Creatinine Clearance: 60.5 mL/min (by C-G formula based on SCr of 0.63 mg/dL). Liver Function Tests:  Recent Labs Lab 10/31/16 2001  AST  17  ALT 11*  ALKPHOS 80  BILITOT 0.8  PROT 7.2  ALBUMIN 3.3*   No results for input(s): LIPASE, AMYLASE in the last 168 hours. No results for input(s): AMMONIA in the last 168 hours. Coagulation Profile: No results for input(s): INR, PROTIME in the last 168 hours. Cardiac Enzymes:  Recent Labs Lab 10/31/16 2001  TROPONINI <0.03   BNP (last 3 results) No results for input(s): PROBNP in the last 8760 hours. HbA1C: No results for input(s): HGBA1C in the last 72 hours. CBG: No results for input(s): GLUCAP in the last 168 hours. Lipid Profile: No results for input(s): CHOL, HDL, LDLCALC, TRIG, CHOLHDL, LDLDIRECT in the last 72 hours. Thyroid Function Tests: No results for input(s): TSH, T4TOTAL, FREET4,  T3FREE, THYROIDAB in the last 72 hours. Anemia Panel: No results for input(s): VITAMINB12, FOLATE, FERRITIN, TIBC, IRON, RETICCTPCT in the last 72 hours. Sepsis Labs: No results for input(s): PROCALCITON, LATICACIDVEN in the last 168 hours.  No results found for this or any previous visit (from the past 240 hour(s)).       Radiology Studies: Ct Angio Chest Pe W And/or Wo Contrast  Result Date: 10/31/2016 CLINICAL DATA:  Acute onset of shortness of breath. Decreased O2 saturation. Initial encounter. EXAM: CT ANGIOGRAPHY CHEST WITH CONTRAST TECHNIQUE: Multidetector CT imaging of the chest was performed using the standard protocol during bolus administration of intravenous contrast. Multiplanar CT image reconstructions and MIPs were obtained to evaluate the vascular anatomy. CONTRAST:  100 mL of Isovue 370 IV contrast COMPARISON:  Chest radiograph performed 06/16/2016, and PET/CT performed 12/29/2014 FINDINGS: Cardiovascular:  There is no evidence of pulmonary embolus. The heart is normal in size. Scattered calcification is seen along the aortic arch. The great vessels are grossly unremarkable in appearance. Mediastinum/Nodes: A mildly prominent 1.2 cm right hilar node is noted. No mediastinal lymphadenopathy is seen. No pericardial effusion is identified. The thyroid gland is unremarkable. No axillary lymphadenopathy is appreciated. Lungs/Pleura: Diffuse tree-in-bud nodular opacities are noted throughout both lungs, with some degree of sparing at the lung apices. This is concerning for an acute atypical infectious or inflammatory process. Spiculation at the right lung base is thought to reflect scarring, given the prior cavitary nodule in this location. The previously noted nodule has resolved. Scarring is noted at the right lung apex. No pleural effusion or pneumothorax is seen. Upper Abdomen: The visualized portions of the liver and spleen are unremarkable. Musculoskeletal: No acute osseous  abnormalities are identified. The visualized musculature is unremarkable in appearance. A 1.7 cm nodule is noted at the 11 o'clock position of the left breast; this is stable from 2016 and likely benign. Review of the MIP images confirms the above findings. IMPRESSION: 1. No evidence of pulmonary embolus. 2. Diffuse tree-in-bud nodular opacities throughout both lungs, with some degree of sparing at the lung apices. This is concerning for an acute atypical infectious or inflammatory process. 3. Spiculation at the right lung base is thought reflect scarring, given the prior cavitary nodule in dislocation. The previously noted nodule has resolved. 4. Scarring at the right lung apex. 5. Mildly prominent 1.2 cm right hilar node may reflect the underlying infection. Electronically Signed   By: Roanna Raider M.D.   On: 10/31/2016 22:07        Scheduled Meds: . azithromycin  250 mg Oral Daily  . budesonide (PULMICORT) nebulizer solution  0.25 mg Nebulization BID  . enoxaparin (LOVENOX) injection  40 mg Subcutaneous Q24H  . guaiFENesin  1,200 mg  Oral BID  . ipratropium-albuterol  3 mL Nebulization Q6H  . methylPREDNISolone (SOLU-MEDROL) injection  80 mg Intravenous Q12H  . sodium chloride flush  3 mL Intravenous Q12H   Continuous Infusions: . sodium chloride       LOS: 2 days    Time spent:28mins    Lukas Pelcher, MD [facility] Pager [phone]  If 7PM-7AM, please contact night-coverage [email or web address] Password [email or web address] 11/02/2016, 4:06 PM

## 2016-11-02 NOTE — Care Management Note (Signed)
Case Management Note  Patient Details  Name: ANNAKATE SOULIER MRN: [id number] Date of Birth: [birth year]   Expected Discharge Date:       11/03/2016           Expected Discharge Plan:  Home/Self Care  In-House Referral:  NA  Discharge planning Services  CM Consult  Post Acute Care Choice:  Durable Medical Equipment Choice offered to:  Patient  DME Arranged:  Nebulizer machine DME Agency:  [facility]  Status of Service:  Completed, signed off    Additional Comments: Anticipate DC home in AM. Pt does not meet qualifications for supplemental oxygen. Will need nebs. Machine ordered and pt has chosen [facility] from list of DME providers. Alroy Bailiff, [facility] rep, aware of needs and will obtain pt info from chart and deliver DME to pt room prior to DC. No other needs communicated.  Malcolm Metro, RN 11/02/2016, 1:01 PM

## 2016-11-02 NOTE — Progress Notes (Signed)
SATURATION QUALIFICATIONS: (This note is used to comply with regulatory documentation for home oxygen)  Patient Saturations on Room Air at Rest = 91%  Patient Saturations on Room Air while Ambulating = 90-92%  Patient did not need to be placed back on oxygen while ambulating.  She returned to her room and oxygen is available in case she needs it.

## 2016-11-03 DIAGNOSIS — G894 Chronic pain syndrome: Secondary | ICD-10-CM | POA: Diagnosis not present

## 2016-11-03 DIAGNOSIS — F419 Anxiety disorder, unspecified: Secondary | ICD-10-CM | POA: Diagnosis not present

## 2016-11-03 DIAGNOSIS — J441 Chronic obstructive pulmonary disease with (acute) exacerbation: Secondary | ICD-10-CM | POA: Diagnosis not present

## 2016-11-03 DIAGNOSIS — J9601 Acute respiratory failure with hypoxia: Secondary | ICD-10-CM | POA: Diagnosis not present

## 2016-11-03 MED ORDER — LISINOPRIL 10 MG PO TABS
10.0000 mg | ORAL_TABLET | Freq: Every day | ORAL | Status: DC
  Administered 2016-11-03: 10 mg via ORAL
  Filled 2016-11-03: qty 1

## 2016-11-03 MED ORDER — ALBUTEROL SULFATE (2.5 MG/3ML) 0.083% IN NEBU: 2.5000 mg | INHALATION_SOLUTION | Freq: Four times a day (QID) | RESPIRATORY_TRACT | 12 refills | Status: DC | PRN

## 2016-11-03 MED ORDER — GUAIFENESIN ER 600 MG PO TB12: 1200.0000 mg | ORAL_TABLET | Freq: Two times a day (BID) | ORAL | 0 refills | Status: DC

## 2016-11-03 MED ORDER — PREDNISONE 10 MG PO TABS: ORAL_TABLET | ORAL | 0 refills | Status: DC

## 2016-11-03 MED ORDER — LEVOFLOXACIN 750 MG PO TABS: 750.0000 mg | ORAL_TABLET | Freq: Every day | ORAL | 0 refills | Status: DC

## 2016-11-03 MED ORDER — ALBUTEROL SULFATE HFA 108 (90 BASE) MCG/ACT IN AERS: 2.0000 | INHALATION_SPRAY | Freq: Four times a day (QID) | RESPIRATORY_TRACT | 2 refills | Status: DC | PRN

## 2016-11-03 MED ORDER — AMLODIPINE BESYLATE 5 MG PO TABS: 5.0000 mg | ORAL_TABLET | Freq: Every day | ORAL | 0 refills | Status: DC

## 2016-11-03 MED ORDER — TIOTROPIUM BROMIDE MONOHYDRATE 18 MCG IN CAPS: 18.0000 ug | ORAL_CAPSULE | Freq: Every day | RESPIRATORY_TRACT | 1 refills | Status: DC

## 2016-11-03 NOTE — Progress Notes (Addendum)
SATURATION QUALIFICATIONS: (This note is used to comply with regulatory documentation for home oxygen)  Patient Saturations on Room Air at Rest = 96%  Patient Saturations on Room Air while Ambulating = 93%  Patient Saturations on  Liters of oxygen while Ambulating = N/A  Please briefly explain why patient needs home oxygen: N/A

## 2016-11-03 NOTE — Progress Notes (Signed)
Pt discharged home today per Dr. Memon. Pt's IV site D/C'd and WDL. Pt's VSS. Pt provided with home medication list, discharge instructions and prescriptions. Verbalized understanding. Pt left floor via WC in stable condition accompanied by NT.  

## 2016-11-03 NOTE — Discharge Summary (Signed)
Physician Discharge Summary  MURIAL BEAM ZOX:[id number] DOB: Feb 22, 1951 DOA: 10/31/2016  PCP: Samuel Jester, DO  Admit date: 10/31/2016 Discharge date: 11/03/2016  Admitted From: home Disposition: home  Recommendations for Outpatient Follow-up:  1. Follow up with PCP in 1-2 weeks 2. Please obtain BMP/CBC in one week  Home Health: Equipment/Devices:  Discharge Condition: stable CODE STATUS: full code Diet recommendation: Heart Healthy  Brief/Interim Summary: 66 year old female with a history of COPD and tobacco use, presented with progressive shortness of breath and found to have COPD exacerbation. She was admitted for further treatments.  Discharge Diagnoses:  Principal Problem:   COPD exacerbation (HCC) Active Problems:   Acute respiratory failure with hypoxia (HCC)   History of DVT (deep vein thrombosis)   HTN (hypertension)   Anxiety   Chronic pain syndrome  1. COPD exacerbation. Patient was treated with intravenous steroids, antibiotics and bronchodilators. Over time, her respiratory status has improved. Wheezing has resolved. She has been weaned off of oxygen as able to ambulate on room air. She'll be transitioned to prednisone taper and completed a course of antibiotics. We'll continue bronchodilators at home.  2. Acute respiratory failure with hypoxia. Related to COPD exacerbation. Patient was weaned down to room air. She was ambulated and did not qualify for supplemental oxygen.  3. History of DVT. Patient reports was years ago she was treated with anticoagulation for 2 years. She does not appear to have any recurrence of DVT or PE at this time.  4. Hypertension. Was previously on lisinopril, but did report excessive cough since being started on this. She's been changed to Norvasc..  5. Anxiety. Continue on home dose of Xanax.  Discharge Instructions  Discharge Instructions    Diet - low sodium heart healthy    Complete by:  As directed    Increase  activity slowly    Complete by:  As directed      Allergies as of 11/03/2016   No Known Allergies     Medication List    STOP taking these medications   lisinopril 10 MG tablet Commonly known as:  PRINIVIL,ZESTRIL     TAKE these medications   albuterol 108 (90 Base) MCG/ACT inhaler Commonly known as:  PROVENTIL HFA;VENTOLIN HFA Inhale 2 puffs into the lungs every 6 (six) hours as needed for wheezing or shortness of breath.   albuterol (2.5 MG/3ML) 0.083% nebulizer solution Commonly known as:  PROVENTIL Take 3 mLs (2.5 mg total) by nebulization every 6 (six) hours as needed for wheezing or shortness of breath.   ALPRAZolam 0.5 MG tablet Commonly known as:  XANAX Take 0.5 mg by mouth 3 (three) times daily as needed for anxiety.   amLODipine 5 MG tablet Commonly known as:  NORVASC Take 1 tablet (5 mg total) by mouth daily.   budesonide-formoterol 160-4.5 MCG/ACT inhaler Commonly known as:  SYMBICORT Inhale 2 puffs into the lungs 2 (two) times daily.   guaiFENesin 600 MG 12 hr tablet Commonly known as:  MUCINEX Take 2 tablets (1,200 mg total) by mouth 2 (two) times daily.   levofloxacin 750 MG tablet Commonly known as:  LEVAQUIN Take 1 tablet (750 mg total) by mouth daily.   oxycodone 30 MG immediate release tablet Commonly known as:  ROXICODONE Take 15-30 mg by mouth every 4 (four) hours as needed for pain.   predniSONE 10 MG tablet Commonly known as:  DELTASONE Take  po daily for 2 days then  daily for 2 days then  daily for 2 days  then  daily for 2 days then stop   tiotropium 18 MCG inhalation capsule Commonly known as:  SPIRIVA HANDIHALER Place 1 capsule (18 mcg total) into inhaler and inhale daily.            Durable Medical Equipment        Start     Ordered   11/02/16 1151  For home use only DME Nebulizer machine  Once    Question:  Patient needs a nebulizer to treat with the following condition  Answer:  COPD (chronic obstructive  pulmonary disease) (HCC)   11/02/16 1150     Follow-up Information    CYNTHIA BUTLER, DO Follow up on 11/08/2016.   Why:  Appointment Wed. April 25th at 2:00pm with Mickle Plumb PA. Contact information: [address] [place] [address] [phone]          No Known Allergies  Consultations:     Procedures/Studies: Ct Angio Chest Pe W And/or Wo Contrast  Result Date: 10/31/2016 CLINICAL DATA:  Acute onset of shortness of breath. Decreased O2 saturation. Initial encounter. EXAM: CT ANGIOGRAPHY CHEST WITH CONTRAST TECHNIQUE: Multidetector CT imaging of the chest was performed using the standard protocol during bolus administration of intravenous contrast. Multiplanar CT image reconstructions and MIPs were obtained to evaluate the vascular anatomy. CONTRAST:  100 mL of Isovue 370 IV contrast COMPARISON:  Chest radiograph performed 06/16/2016, and PET/CT performed 12/29/2014 FINDINGS: Cardiovascular:  There is no evidence of pulmonary embolus. The heart is normal in size. Scattered calcification is seen along the aortic arch. The great vessels are grossly unremarkable in appearance. Mediastinum/Nodes: A mildly prominent 1.2 cm right hilar node is noted. No mediastinal lymphadenopathy is seen. No pericardial effusion is identified. The thyroid gland is unremarkable. No axillary lymphadenopathy is appreciated. Lungs/Pleura: Diffuse tree-in-bud nodular opacities are noted throughout both lungs, with some degree of sparing at the lung apices. This is concerning for an acute atypical infectious or inflammatory process. Spiculation at the right lung base is thought to reflect scarring, given the prior cavitary nodule in this location. The previously noted nodule has resolved. Scarring is noted at the right lung apex. No pleural effusion or pneumothorax is seen. Upper Abdomen: The visualized portions of the liver and spleen are unremarkable. Musculoskeletal: No acute osseous abnormalities are  identified. The visualized musculature is unremarkable in appearance. A 1.7 cm nodule is noted at the 11 o'clock position of the left breast; this is stable from 2016 and likely benign. Review of the MIP images confirms the above findings. IMPRESSION: 1. No evidence of pulmonary embolus. 2. Diffuse tree-in-bud nodular opacities throughout both lungs, with some degree of sparing at the lung apices. This is concerning for an acute atypical infectious or inflammatory process. 3. Spiculation at the right lung base is thought reflect scarring, given the prior cavitary nodule in dislocation. The previously noted nodule has resolved. 4. Scarring at the right lung apex. 5. Mildly prominent 1.2 cm right hilar node may reflect the underlying infection. Electronically Signed   By: Roanna Raider M.D.   On: 10/31/2016 22:07       Subjective: Feeling better. Shortness of breath improving.  Discharge Exam: Vitals:   11/03/16 0611 11/03/16 1300  BP: (!) 172/94 (!) 155/81  Pulse: 93 98  Resp: 18 18  Temp: 97.9 F (36.6 C) 98.2 F (36.8 C)   Vitals:   11/03/16 0813 11/03/16 0817 11/03/16 1300 11/03/16 1352  BP:   (!) 155/81   Pulse:  98   Resp:   18   Temp:   98.2 F (36.8 C)   TempSrc:   Oral   SpO2: 91% 96% 94% 92%  Weight:      Height:        General: Pt is alert, awake, not in acute distress Cardiovascular: RRR, S1/S2 +, no rubs, no gallops Respiratory: CTA bilaterally, no wheezing, no rhonchi Abdominal: Soft, NT, ND, bowel sounds + Extremities: no edema, no cyanosis    The results of significant diagnostics from this hospitalization (including imaging, microbiology, ancillary and laboratory) are listed below for reference.     Microbiology: No results found for this or any previous visit (from the past 240 hour(s)).   Labs: BNP (last 3 results) No results for input(s): BNP in the last 8760 hours. Basic Metabolic Panel:  Recent Labs Lab 10/31/16 2001  NA 135  K 3.6  CL  100*  CO2 27  GLUCOSE 136*  BUN 10  CREATININE 0.63  CALCIUM 8.9   Liver Function Tests:  Recent Labs Lab 10/31/16 2001  AST 17  ALT 11*  ALKPHOS 80  BILITOT 0.8  PROT 7.2  ALBUMIN 3.3*   No results for input(s): LIPASE, AMYLASE in the last 168 hours. No results for input(s): AMMONIA in the last 168 hours. CBC:  Recent Labs Lab 10/31/16 2001  WBC 17.8*  NEUTROABS 15.8*  HGB 14.2  HCT 42.5  MCV 92.6  PLT 145*   Cardiac Enzymes:  Recent Labs Lab 10/31/16 2001  TROPONINI <0.03   BNP: Invalid input(s): POCBNP CBG: No results for input(s): GLUCAP in the last 168 hours. D-Dimer  Recent Labs  10/31/16 2001  DDIMER 3.70*   Hgb A1c No results for input(s): HGBA1C in the last 72 hours. Lipid Profile No results for input(s): CHOL, HDL, LDLCALC, TRIG, CHOLHDL, LDLDIRECT in the last 72 hours. Thyroid function studies No results for input(s): TSH, T4TOTAL, T3FREE, THYROIDAB in the last 72 hours.  Invalid input(s): FREET3 Anemia work up No results for input(s): VITAMINB12, FOLATE, FERRITIN, TIBC, IRON, RETICCTPCT in the last 72 hours. Urinalysis No results found for: COLORURINE, APPEARANCEUR, LABSPEC, PHURINE, GLUCOSEU, HGBUR, BILIRUBINUR, KETONESUR, PROTEINUR, UROBILINOGEN, NITRITE, LEUKOCYTESUR Sepsis Labs Invalid input(s): PROCALCITONIN,  WBC,  LACTICIDVEN Microbiology No results found for this or any previous visit (from the past 240 hour(s)).   Time coordinating discharge: Over 30 minutes  SIGNED:   Erick Blinks, MD  [facility] 11/03/2016, 6:49 PM Pager   If 7PM-7AM, please contact night-coverage [email or web address] Password [email or web address]

## 2017-08-01 ENCOUNTER — Other Ambulatory Visit: Payer: Self-pay

## 2017-08-01 ENCOUNTER — Emergency Department (HOSPITAL_COMMUNITY): Payer: Medicare HMO

## 2017-08-01 ENCOUNTER — Encounter (HOSPITAL_COMMUNITY): Payer: Self-pay | Admitting: *Deleted

## 2017-08-01 ENCOUNTER — Emergency Department (HOSPITAL_COMMUNITY)
Admission: EM | Admit: 2017-08-01 | Discharge: 2017-08-01 | Disposition: A | Discharge status: Home / Self Care | Payer: Medicare HMO | Attending: Emergency Medicine | Admitting: Emergency Medicine

## 2017-08-01 DIAGNOSIS — J441 Chronic obstructive pulmonary disease with (acute) exacerbation: Secondary | ICD-10-CM | POA: Diagnosis not present

## 2017-08-01 DIAGNOSIS — I10 Essential (primary) hypertension: Secondary | ICD-10-CM | POA: Diagnosis not present

## 2017-08-01 DIAGNOSIS — R0602 Shortness of breath: Secondary | ICD-10-CM | POA: Diagnosis present

## 2017-08-01 DIAGNOSIS — F17210 Nicotine dependence, cigarettes, uncomplicated: Secondary | ICD-10-CM | POA: Diagnosis not present

## 2017-08-01 DIAGNOSIS — Z96641 Presence of right artificial hip joint: Secondary | ICD-10-CM | POA: Insufficient documentation

## 2017-08-01 DIAGNOSIS — Z720 Tobacco use: Secondary | ICD-10-CM

## 2017-08-01 DIAGNOSIS — Z79899 Other long term (current) drug therapy: Secondary | ICD-10-CM | POA: Insufficient documentation

## 2017-08-01 LAB — CBC WITH DIFFERENTIAL/PLATELET
BASOS ABS: 0 10*3/uL (ref 0.0–0.1)
Basophils Relative: 1 %
EOS PCT: 0 %
Eosinophils Absolute: 0 10*3/uL (ref 0.0–0.7)
HEMATOCRIT: 43 % (ref 36.0–46.0)
HEMOGLOBIN: 13.6 g/dL (ref 12.0–15.0)
LYMPHS ABS: 0.7 10*3/uL (ref 0.7–4.0)
LYMPHS PCT: 12 %
MCH: 31.1 pg (ref 26.0–34.0)
MCHC: 31.6 g/dL (ref 30.0–36.0)
MCV: 98.2 fL (ref 78.0–100.0)
Monocytes Absolute: 0.4 10*3/uL (ref 0.1–1.0)
Monocytes Relative: 7 %
NEUTROS ABS: 4.3 10*3/uL (ref 1.7–7.7)
NEUTROS PCT: 80 %
Platelets: 175 10*3/uL (ref 150–400)
RBC: 4.38 MIL/uL (ref 3.87–5.11)
RDW: 13.4 % (ref 11.5–15.5)
WBC: 5.4 10*3/uL (ref 4.0–10.5)

## 2017-08-01 LAB — BASIC METABOLIC PANEL
ANION GAP: 10 (ref 5–15)
BUN: 11 mg/dL (ref 6–20)
CHLORIDE: 102 mmol/L (ref 101–111)
CO2: 26 mmol/L (ref 22–32)
Calcium: 8.8 mg/dL — ABNORMAL LOW (ref 8.9–10.3)
Creatinine, Ser: 0.62 mg/dL (ref 0.44–1.00)
GFR calc Af Amer: >60 mL/min (ref >60)
GFR calc non Af Amer: >60 mL/min (ref >60)
Glucose, Bld: 103 mg/dL — ABNORMAL HIGH (ref 65–99)
POTASSIUM: 3.5 mmol/L (ref 3.5–5.1)
SODIUM: 138 mmol/L (ref 135–145)

## 2017-08-01 MED ORDER — ALBUTEROL (5 MG/ML) CONTINUOUS INHALATION SOLN
15.0000 mg/h | INHALATION_SOLUTION | RESPIRATORY_TRACT | Status: DC
  Administered 2017-08-01: 15 mg/h via RESPIRATORY_TRACT
  Filled 2017-08-01: qty 20

## 2017-08-01 MED ORDER — PREDNISONE 10 MG PO TABS: ORAL_TABLET | ORAL | 0 refills | Status: DC

## 2017-08-01 MED ORDER — ALBUTEROL SULFATE (2.5 MG/3ML) 0.083% IN NEBU: 5.0000 mg | INHALATION_SOLUTION | Freq: Once | RESPIRATORY_TRACT | Status: DC

## 2017-08-01 MED ORDER — ALBUTEROL (5 MG/ML) CONTINUOUS INHALATION SOLN
10.0000 mg/h | INHALATION_SOLUTION | Freq: Once | RESPIRATORY_TRACT | Status: AC
  Administered 2017-08-01: 10 mg/h via RESPIRATORY_TRACT
  Filled 2017-08-01: qty 20

## 2017-08-01 MED ORDER — PREDNISONE 50 MG PO TABS
60.0000 mg | ORAL_TABLET | Freq: Once | ORAL | Status: AC
  Administered 2017-08-01: 60 mg via ORAL
  Filled 2017-08-01: qty 1

## 2017-08-01 MED ORDER — DOXYCYCLINE HYCLATE 100 MG PO CAPS: 100.0000 mg | ORAL_CAPSULE | Freq: Two times a day (BID) | ORAL | 0 refills | Status: DC

## 2017-08-01 NOTE — ED Notes (Signed)
Pt finished with hour long neb at this time.

## 2017-08-01 NOTE — ED Triage Notes (Signed)
Pt c/o SOB and Teresa Edwards productive cough that started this morning upon awakening at 0400. Denies fever, pain. Pt has used 2 nebulizer treatments and inhaler this morning with no relief.

## 2017-08-01 NOTE — ED Notes (Signed)
Pt endorses decreased SOB since nebulizer tx and has productive cough.

## 2017-08-01 NOTE — ED Provider Notes (Signed)
[facility] EMERGENCY DEPARTMENT Provider Note   CSN: [id number] Arrival date & time: 08/01/17  4098     History   Chief Complaint Chief Complaint  Patient presents with  . Shortness of Breath    HPI Teresa Edwards is a 67 y.o. female.  She presents for evaluation of shortness of breath, first noticed today, and did not improve when she used her nebulizers at home.  He denies fever, chills, nausea, vomiting, weakness or dizziness.  She is a cigarette smoker.  There are no other known modifying factors.  HPI  Past Medical History:  Diagnosis Date  . COPD (chronic obstructive pulmonary disease) (HCC)   . DDD (degenerative disc disease)   . DJD (degenerative joint disease)   . DVT femoral (deep venous thrombosis) with thrombophlebitis [facility])     Patient Active Problem List   Diagnosis Date Noted  . HTN (hypertension) 11/01/2016  . Anxiety 11/01/2016  . Chronic pain syndrome 11/01/2016  . COPD exacerbation (HCC) 10/31/2016  . Acute respiratory failure with hypoxia (HCC) 10/31/2016  . History of DVT (deep vein thrombosis) 10/31/2016  . Cavitating mass in right lower lung lobe 08/31/2014  . Pain in limb 10/29/2013    Past Surgical History:  Procedure Laterality Date  . TOTAL HIP ARTHROPLASTY Right   . TUBAL LIGATION  10/1971    OB History    No data available       Home Medications    Prior to Admission medications   Medication Sig Start Date End Date Taking? Authorizing Provider  albuterol (PROVENTIL HFA;VENTOLIN HFA) 108 (90 Base) MCG/ACT inhaler Inhale 2 puffs into the lungs every 6 (six) hours as needed for wheezing or shortness of breath. 11/03/16  Yes Erick Blinks, MD  albuterol (PROVENTIL) (2.5 MG/3ML) 0.083% nebulizer solution Take 3 mLs (2.5 mg total) by nebulization every 6 (six) hours as needed for wheezing or shortness of breath. 11/03/16  Yes Erick Blinks, MD  ALPRAZolam Prudy Feeler) 0.5 MG tablet Take 0.5 mg by mouth 3 (three) times daily as  needed for anxiety.    Yes [provider]  amLODipine (NORVASC) 5 MG tablet Take 1 tablet (5 mg total) by mouth daily. 11/03/16  Yes Erick Blinks, MD  budesonide-formoterol (SYMBICORT) 160-4.5 MCG/ACT inhaler Inhale 2 puffs into the lungs 2 (two) times daily.   Yes [provider]  oxycodone (ROXICODONE) 30 MG immediate release tablet Take 15-30 mg by mouth every 4 (four) hours as needed for pain.    Yes [provider]  doxycycline (VIBRAMYCIN) 100 MG capsule Take 1 capsule (100 mg total) by mouth 2 (two) times daily. One po bid x 10 days 08/01/17   Mancel Bale, MD  predniSONE (DELTASONE) 10 MG tablet Take q day 6, 6,5,5,4,4,3,3,2,2,1,1 08/01/17   Mancel Bale, MD    Family History Family History  Problem Relation Age of Onset  . Deep vein thrombosis Mother   . Other Mother        varicose veins  . Hypertension Sister   . Other Sister        varicose veins  . Cancer Brother     Social History Social History   Tobacco Use  . Smoking status: Current Every Day Smoker    Packs/day: 1.00    Years: 40.00    Pack years: 40.00    Types: Cigarettes  . Smokeless tobacco: Never Used  . Tobacco comment: 3-4 cigs daily 11/13/14  Substance Use Topics  . Alcohol use: No  Alcohol/week: 0.0 oz  . Drug use: No     Allergies   Patient has no known allergies.   Review of Systems Review of Systems  All other systems reviewed and are negative.    Physical Exam Updated Vital Signs BP 138/75   Pulse 97   Temp 98 F (36.7 C) (Oral)   Resp 20   Ht 5\' 4"[id number]$  (1.626 m)   Wt 62.6 kg (138 lb)   SpO2 97%   BMI 23.69 kg/m   Physical Exam  Constitutional: She is oriented to person, place, and time. She appears well-developed. She appears ill.  Appears older than stated age  HENT:  Head: Normocephalic and atraumatic.  Eyes: Conjunctivae and EOM are normal. Pupils are equal, round, and reactive to light.  Neck: Normal range of motion and phonation  normal. Neck supple.  Cardiovascular: Normal rate and regular rhythm.  Pulmonary/Chest: Accessory muscle usage present. Tachypnea noted. No respiratory distress. She has decreased breath sounds in the right upper field, the right middle field, the right lower field, the left upper field, the left middle field and the left lower field. She has wheezes in the right upper field, the right middle field, the left upper field and the left middle field. She has no rales. She exhibits no tenderness, no edema and no swelling.  Abdominal: Soft. She exhibits no distension. There is no tenderness. There is no guarding.  Musculoskeletal: Normal range of motion.  Neurological: She is alert and oriented to person, place, and time. She exhibits normal muscle tone.  Skin: Skin is warm and dry.  Psychiatric: She has a normal mood and affect. Her behavior is normal. Judgment and thought content normal.  Nursing note and vitals reviewed.    ED Treatments / Results  Labs (all labs ordered are listed, but only abnormal results are displayed) Labs Reviewed  BASIC METABOLIC PANEL - Abnormal; Notable for the following components:      Result Value   Glucose, Bld 103 (*)    Calcium 8.8 (*)    All other components within normal limits  CBC WITH DIFFERENTIAL/PLATELET    EKG  EKG Interpretation  Date/Time:  Wednesday August 01 2017 09:54:25 EST Ventricular Rate:  85 PR Interval:    QRS Duration: 81 QT Interval:  372 QTC Calculation: 443 R Axis:   82 Text Interpretation:  Sinus rhythm RAE, consider biatrial enlargement Borderline right axis deviation since last tracing no significant change Confirmed by Mancel BaleWentz, Desira Alessandrini [phone]([id number]) on 08/01/2017 2:10:24 PM       Radiology Dg Chest 2 View  Result Date: 08/01/2017 CLINICAL DATA:  Shortness of breath today, smoking history EXAM: CHEST  2 VIEW COMPARISON:  CT chest of 10/31/2016 and chest x-ray of 06/16/2016 FINDINGS: The lungs remain clear but hyperaerated  consistent with emphysema with increased AP diameter and flattened hemidiaphragms. A small area of probable scarring is present just above the right hemidiaphragm at the right lung base most likely due to scarring at the site of prior nodule. There is peribronchial thickening which may indicate bronchitis. Mediastinal and hilar contours are unremarkable. The heart is within normal limits in size. No acute bony abnormality is seen. IMPRESSION: Hyperaeration consistent with emphysema. Peribronchial thickening may indicate bronchitis. Vague opacity deep in the right lung base probably represents scarring as noted above. Electronically Signed   By: Dwyane DeePaul  Barry M.D.   On: 08/01/2017 10:46    Procedures .Critical Care Performed by: Mancel BaleWentz, Talecia Sherlin, MD Authorized by: Mancel BaleWentz, Crestina Strike, MD  Critical care provider statement:    Critical care time (minutes):  70   Critical care start time:  08/01/2017 10:25 AM   Critical care end time:  08/01/2017 5:37 PM   Critical care time was exclusive of:  Separately billable procedures and treating other patients   Critical care was necessary to treat or prevent imminent or life-threatening deterioration of the following conditions:  Respiratory failure   Critical care was time spent personally by me on the following activities:  Blood draw for specimens, development of treatment plan with patient or surrogate, evaluation of patient's response to treatment, examination of patient, obtaining history from patient or surrogate, ordering and performing treatments and interventions, ordering and review of laboratory studies, ordering and review of radiographic studies, pulse oximetry, re-evaluation of patient's condition and review of old charts   (including critical care time)  Medications Ordered in ED Medications  albuterol (PROVENTIL,VENTOLIN) solution continuous neb (0 mg/hr Nebulization Stopped 08/01/17 1139)  predniSONE (DELTASONE) tablet 60 mg (60 mg Oral Given 08/01/17  1015)  albuterol (PROVENTIL,VENTOLIN) solution continuous neb (10 mg/hr Nebulization Given 08/01/17 1540)     Initial Impression / Assessment and Plan / ED Course  I have reviewed the triage vital signs and the nursing notes.  Pertinent labs & imaging results that were available during my care of the patient were reviewed by me and considered in my medical decision making (see chart for details).  Clinical Course as of Aug 01 1730  Wed Aug 01, 2017  1218 She feels better, stating that she is able to cough his secretions in an improved manner.  She got up to walk to the bathroom and felt short of breath.  At this time she has improved air movement with auscultation but still having generalized wheezing, and decreased air movement.  We will continue to monitor likely give second nebulizer, then reassess.  [EW]    Clinical Course User Index [EW] Mancel Bale, MD     Patient Vitals for the past 24 hrs:  BP Temp Temp src Pulse Resp SpO2 Height Weight  08/01/17 1715 138/75 - - 97 20 97 % - -  08/01/17 1642 - - - 96 18 98 % - -  08/01/17 1542 - - - - - 90 % - -  08/01/17 1416 115/61 - - 90 18 98 % - -  08/01/17 1218 128/63 - - (!) 110 18 93 % - -  08/01/17 1200 128/63 - - (!) 105 17 95 % - -  08/01/17 1100 135/67 - - 89 15 100 % - -  08/01/17 1040 - - - - - 97 % - -  08/01/17 0948 (!) 184/93 98 F (36.7 C) Oral 89 16 98 % - -  08/01/17 0944 - - - - - - 5\' 4"  (1.626 m) 62.6 kg (138 lb)  08/01/17 0943 - - - - - 95 % - -    5:34 PM Reevaluation with update and discussion. After initial assessment and treatment, an updated evaluation reveals patient states she feels better at this time after the second nebulizer and feels like she can manage at home using her nebulizer.  Currently heart rate 98 and oxygen saturation 93%.  Patient is on room air. findings discussed and questions answered. Mancel Bale      Final Clinical Impressions(s) / ED Diagnoses   Final diagnoses:  COPD  exacerbation (HCC)  Tobacco abuse   Evaluation consistent with COPD exacerbation, likely secondary to tobacco abuse.  Blood  pressure improved after treatment, and patient without respiratory distress.  Nursing Notes Reviewed/ Care Coordinated Applicable Imaging Reviewed Interpretation of Laboratory Data incorporated into ED treatment  The patient appears reasonably screened and/or stabilized for discharge and I doubt any other medical condition or other [facility] requiring further screening, evaluation, or treatment in the ED at this time prior to discharge.  Plan: Home Medications-continue current medications; Home Treatments-rest, fluids, stop smoking; return here if the recommended treatment, does not improve the symptoms; Recommended follow up-PCP checkup 2 or 3 days and as needed   ED Discharge Orders        Ordered    doxycycline (VIBRAMYCIN) 100 MG capsule  2 times daily     08/01/17 1731    predniSONE (DELTASONE) 10 MG tablet     08/01/17 1731       Mancel Bale, MD 08/01/17 1737

## 2017-08-01 NOTE — ED Notes (Signed)
Pt reports ease of breathing and feels like she is ready to go home.

## 2017-08-01 NOTE — Discharge Instructions (Signed)
Start the prednisone and antibiotic prescriptions, tomorrow morning.  Call your primary care doctor for a follow-up appointment in 2 or 3 days.  To stop smoking.

## 2020-08-05 DIAGNOSIS — J441 Chronic obstructive pulmonary disease with (acute) exacerbation: Secondary | ICD-10-CM | POA: Diagnosis not present

## 2020-08-05 DIAGNOSIS — M1990 Unspecified osteoarthritis, unspecified site: Secondary | ICD-10-CM | POA: Diagnosis not present

## 2020-08-12 DIAGNOSIS — Z79899 Other long term (current) drug therapy: Secondary | ICD-10-CM | POA: Diagnosis not present

## 2020-08-12 DIAGNOSIS — G894 Chronic pain syndrome: Secondary | ICD-10-CM | POA: Diagnosis not present

## 2020-08-12 DIAGNOSIS — G8929 Other chronic pain: Secondary | ICD-10-CM | POA: Diagnosis not present

## 2020-08-12 DIAGNOSIS — M545 Low back pain, unspecified: Secondary | ICD-10-CM | POA: Diagnosis not present

## 2020-08-21 DIAGNOSIS — J9622 Acute and chronic respiratory failure with hypercapnia: Secondary | ICD-10-CM | POA: Diagnosis not present

## 2020-08-21 DIAGNOSIS — J9602 Acute respiratory failure with hypercapnia: Secondary | ICD-10-CM | POA: Diagnosis not present

## 2020-08-21 DIAGNOSIS — R059 Cough, unspecified: Secondary | ICD-10-CM | POA: Diagnosis not present

## 2020-08-21 DIAGNOSIS — G8929 Other chronic pain: Secondary | ICD-10-CM | POA: Diagnosis not present

## 2020-08-21 DIAGNOSIS — Z20822 Contact with and (suspected) exposure to covid-19: Secondary | ICD-10-CM | POA: Diagnosis not present

## 2020-08-21 DIAGNOSIS — F17200 Nicotine dependence, unspecified, uncomplicated: Secondary | ICD-10-CM | POA: Diagnosis not present

## 2020-08-21 DIAGNOSIS — J440 Chronic obstructive pulmonary disease with acute lower respiratory infection: Secondary | ICD-10-CM | POA: Diagnosis not present

## 2020-08-21 DIAGNOSIS — R0602 Shortness of breath: Secondary | ICD-10-CM | POA: Diagnosis not present

## 2020-08-21 DIAGNOSIS — R778 Other specified abnormalities of plasma proteins: Secondary | ICD-10-CM | POA: Diagnosis not present

## 2020-08-21 DIAGNOSIS — E876 Hypokalemia: Secondary | ICD-10-CM | POA: Diagnosis not present

## 2020-08-21 DIAGNOSIS — R911 Solitary pulmonary nodule: Secondary | ICD-10-CM | POA: Diagnosis not present

## 2020-08-21 DIAGNOSIS — J9621 Acute and chronic respiratory failure with hypoxia: Secondary | ICD-10-CM | POA: Diagnosis not present

## 2020-08-21 DIAGNOSIS — E875 Hyperkalemia: Secondary | ICD-10-CM | POA: Diagnosis not present

## 2020-08-21 DIAGNOSIS — R7989 Other specified abnormal findings of blood chemistry: Secondary | ICD-10-CM | POA: Diagnosis not present

## 2020-08-21 DIAGNOSIS — I10 Essential (primary) hypertension: Secondary | ICD-10-CM | POA: Diagnosis not present

## 2020-08-21 DIAGNOSIS — J441 Chronic obstructive pulmonary disease with (acute) exacerbation: Secondary | ICD-10-CM | POA: Diagnosis not present

## 2020-08-21 DIAGNOSIS — Z9981 Dependence on supplemental oxygen: Secondary | ICD-10-CM | POA: Diagnosis not present

## 2020-08-21 DIAGNOSIS — J9601 Acute respiratory failure with hypoxia: Secondary | ICD-10-CM | POA: Diagnosis not present

## 2020-09-05 DIAGNOSIS — M1990 Unspecified osteoarthritis, unspecified site: Secondary | ICD-10-CM | POA: Diagnosis not present

## 2020-09-05 DIAGNOSIS — J441 Chronic obstructive pulmonary disease with (acute) exacerbation: Secondary | ICD-10-CM | POA: Diagnosis not present

## 2020-09-09 DIAGNOSIS — Z7409 Other reduced mobility: Secondary | ICD-10-CM | POA: Diagnosis not present

## 2020-09-09 DIAGNOSIS — R0602 Shortness of breath: Secondary | ICD-10-CM | POA: Diagnosis not present

## 2020-09-09 DIAGNOSIS — J441 Chronic obstructive pulmonary disease with (acute) exacerbation: Secondary | ICD-10-CM | POA: Diagnosis not present

## 2020-09-10 DIAGNOSIS — M545 Low back pain, unspecified: Secondary | ICD-10-CM | POA: Diagnosis not present

## 2020-09-10 DIAGNOSIS — G894 Chronic pain syndrome: Secondary | ICD-10-CM | POA: Diagnosis not present

## 2020-09-10 DIAGNOSIS — G8929 Other chronic pain: Secondary | ICD-10-CM | POA: Diagnosis not present

## 2020-09-10 DIAGNOSIS — Z79899 Other long term (current) drug therapy: Secondary | ICD-10-CM | POA: Diagnosis not present

## 2020-09-15 DIAGNOSIS — J441 Chronic obstructive pulmonary disease with (acute) exacerbation: Secondary | ICD-10-CM | POA: Diagnosis not present

## 2020-09-15 DIAGNOSIS — Z7409 Other reduced mobility: Secondary | ICD-10-CM | POA: Diagnosis not present

## 2020-09-15 DIAGNOSIS — R0602 Shortness of breath: Secondary | ICD-10-CM | POA: Diagnosis not present

## 2020-09-17 DIAGNOSIS — Z7409 Other reduced mobility: Secondary | ICD-10-CM | POA: Diagnosis not present

## 2020-09-17 DIAGNOSIS — J441 Chronic obstructive pulmonary disease with (acute) exacerbation: Secondary | ICD-10-CM | POA: Diagnosis not present

## 2020-09-17 DIAGNOSIS — R0602 Shortness of breath: Secondary | ICD-10-CM | POA: Diagnosis not present

## 2020-09-23 DIAGNOSIS — Z6821 Body mass index (BMI) 21.0-21.9, adult: Secondary | ICD-10-CM | POA: Diagnosis not present

## 2020-09-23 DIAGNOSIS — M545 Low back pain, unspecified: Secondary | ICD-10-CM | POA: Diagnosis not present

## 2020-09-23 DIAGNOSIS — J449 Chronic obstructive pulmonary disease, unspecified: Secondary | ICD-10-CM | POA: Diagnosis not present

## 2020-09-28 DIAGNOSIS — R0602 Shortness of breath: Secondary | ICD-10-CM | POA: Diagnosis not present

## 2020-09-28 DIAGNOSIS — J441 Chronic obstructive pulmonary disease with (acute) exacerbation: Secondary | ICD-10-CM | POA: Diagnosis not present

## 2020-09-28 DIAGNOSIS — Z7409 Other reduced mobility: Secondary | ICD-10-CM | POA: Diagnosis not present

## 2020-09-29 DIAGNOSIS — M545 Low back pain, unspecified: Secondary | ICD-10-CM | POA: Diagnosis not present

## 2020-09-29 DIAGNOSIS — G8929 Other chronic pain: Secondary | ICD-10-CM | POA: Diagnosis not present

## 2020-09-29 DIAGNOSIS — Z6821 Body mass index (BMI) 21.0-21.9, adult: Secondary | ICD-10-CM | POA: Diagnosis not present

## 2020-09-29 DIAGNOSIS — Z79899 Other long term (current) drug therapy: Secondary | ICD-10-CM | POA: Diagnosis not present

## 2020-09-29 DIAGNOSIS — G894 Chronic pain syndrome: Secondary | ICD-10-CM | POA: Diagnosis not present

## 2020-09-29 DIAGNOSIS — F17210 Nicotine dependence, cigarettes, uncomplicated: Secondary | ICD-10-CM | POA: Diagnosis not present

## 2020-09-29 DIAGNOSIS — F17200 Nicotine dependence, unspecified, uncomplicated: Secondary | ICD-10-CM | POA: Diagnosis not present

## 2020-09-30 DIAGNOSIS — R0602 Shortness of breath: Secondary | ICD-10-CM | POA: Diagnosis not present

## 2020-09-30 DIAGNOSIS — J441 Chronic obstructive pulmonary disease with (acute) exacerbation: Secondary | ICD-10-CM | POA: Diagnosis not present

## 2020-09-30 DIAGNOSIS — Z7409 Other reduced mobility: Secondary | ICD-10-CM | POA: Diagnosis not present

## 2020-10-03 DIAGNOSIS — M1990 Unspecified osteoarthritis, unspecified site: Secondary | ICD-10-CM | POA: Diagnosis not present

## 2020-10-03 DIAGNOSIS — J441 Chronic obstructive pulmonary disease with (acute) exacerbation: Secondary | ICD-10-CM | POA: Diagnosis not present

## 2020-10-04 DIAGNOSIS — R0602 Shortness of breath: Secondary | ICD-10-CM | POA: Diagnosis not present

## 2020-10-04 DIAGNOSIS — Z7409 Other reduced mobility: Secondary | ICD-10-CM | POA: Diagnosis not present

## 2020-10-04 DIAGNOSIS — J441 Chronic obstructive pulmonary disease with (acute) exacerbation: Secondary | ICD-10-CM | POA: Diagnosis not present

## 2020-10-07 DIAGNOSIS — R0602 Shortness of breath: Secondary | ICD-10-CM | POA: Diagnosis not present

## 2020-10-07 DIAGNOSIS — Z7409 Other reduced mobility: Secondary | ICD-10-CM | POA: Diagnosis not present

## 2020-10-07 DIAGNOSIS — J441 Chronic obstructive pulmonary disease with (acute) exacerbation: Secondary | ICD-10-CM | POA: Diagnosis not present

## 2020-10-11 DIAGNOSIS — M1612 Unilateral primary osteoarthritis, left hip: Secondary | ICD-10-CM | POA: Diagnosis not present

## 2020-10-11 DIAGNOSIS — Z79891 Long term (current) use of opiate analgesic: Secondary | ICD-10-CM | POA: Diagnosis not present

## 2020-10-11 DIAGNOSIS — G894 Chronic pain syndrome: Secondary | ICD-10-CM | POA: Diagnosis not present

## 2020-10-11 DIAGNOSIS — M47816 Spondylosis without myelopathy or radiculopathy, lumbar region: Secondary | ICD-10-CM | POA: Diagnosis not present

## 2020-10-12 DIAGNOSIS — Z7409 Other reduced mobility: Secondary | ICD-10-CM | POA: Diagnosis not present

## 2020-10-12 DIAGNOSIS — J441 Chronic obstructive pulmonary disease with (acute) exacerbation: Secondary | ICD-10-CM | POA: Diagnosis not present

## 2020-10-12 DIAGNOSIS — R29898 Other symptoms and signs involving the musculoskeletal system: Secondary | ICD-10-CM | POA: Diagnosis not present

## 2020-10-14 DIAGNOSIS — Z7409 Other reduced mobility: Secondary | ICD-10-CM | POA: Diagnosis not present

## 2020-10-14 DIAGNOSIS — J441 Chronic obstructive pulmonary disease with (acute) exacerbation: Secondary | ICD-10-CM | POA: Diagnosis not present

## 2020-10-14 DIAGNOSIS — R29898 Other symptoms and signs involving the musculoskeletal system: Secondary | ICD-10-CM | POA: Diagnosis not present

## 2020-10-19 DIAGNOSIS — R29898 Other symptoms and signs involving the musculoskeletal system: Secondary | ICD-10-CM | POA: Diagnosis not present

## 2020-10-19 DIAGNOSIS — J441 Chronic obstructive pulmonary disease with (acute) exacerbation: Secondary | ICD-10-CM | POA: Diagnosis not present

## 2020-10-19 DIAGNOSIS — Z7409 Other reduced mobility: Secondary | ICD-10-CM | POA: Diagnosis not present

## 2020-10-20 DIAGNOSIS — J441 Chronic obstructive pulmonary disease with (acute) exacerbation: Secondary | ICD-10-CM | POA: Diagnosis not present

## 2020-10-20 DIAGNOSIS — Z7409 Other reduced mobility: Secondary | ICD-10-CM | POA: Diagnosis not present

## 2020-10-20 DIAGNOSIS — R29898 Other symptoms and signs involving the musculoskeletal system: Secondary | ICD-10-CM | POA: Diagnosis not present

## 2020-10-21 DIAGNOSIS — Z1231 Encounter for screening mammogram for malignant neoplasm of breast: Secondary | ICD-10-CM | POA: Diagnosis not present

## 2020-10-25 DIAGNOSIS — J441 Chronic obstructive pulmonary disease with (acute) exacerbation: Secondary | ICD-10-CM | POA: Diagnosis not present

## 2020-10-25 DIAGNOSIS — Z7409 Other reduced mobility: Secondary | ICD-10-CM | POA: Diagnosis not present

## 2020-10-25 DIAGNOSIS — R29898 Other symptoms and signs involving the musculoskeletal system: Secondary | ICD-10-CM | POA: Diagnosis not present

## 2020-10-27 DIAGNOSIS — J441 Chronic obstructive pulmonary disease with (acute) exacerbation: Secondary | ICD-10-CM | POA: Diagnosis not present

## 2020-10-27 DIAGNOSIS — R29898 Other symptoms and signs involving the musculoskeletal system: Secondary | ICD-10-CM | POA: Diagnosis not present

## 2020-10-27 DIAGNOSIS — Z7409 Other reduced mobility: Secondary | ICD-10-CM | POA: Diagnosis not present

## 2020-11-01 DIAGNOSIS — J441 Chronic obstructive pulmonary disease with (acute) exacerbation: Secondary | ICD-10-CM | POA: Diagnosis not present

## 2020-11-01 DIAGNOSIS — Z7409 Other reduced mobility: Secondary | ICD-10-CM | POA: Diagnosis not present

## 2020-11-01 DIAGNOSIS — R29898 Other symptoms and signs involving the musculoskeletal system: Secondary | ICD-10-CM | POA: Diagnosis not present

## 2020-11-03 DIAGNOSIS — R29898 Other symptoms and signs involving the musculoskeletal system: Secondary | ICD-10-CM | POA: Diagnosis not present

## 2020-11-03 DIAGNOSIS — J441 Chronic obstructive pulmonary disease with (acute) exacerbation: Secondary | ICD-10-CM | POA: Diagnosis not present

## 2020-11-03 DIAGNOSIS — M1990 Unspecified osteoarthritis, unspecified site: Secondary | ICD-10-CM | POA: Diagnosis not present

## 2020-11-03 DIAGNOSIS — Z7409 Other reduced mobility: Secondary | ICD-10-CM | POA: Diagnosis not present

## 2020-11-05 DIAGNOSIS — F17200 Nicotine dependence, unspecified, uncomplicated: Secondary | ICD-10-CM | POA: Diagnosis not present

## 2020-11-05 DIAGNOSIS — M545 Low back pain, unspecified: Secondary | ICD-10-CM | POA: Diagnosis not present

## 2020-11-05 DIAGNOSIS — G894 Chronic pain syndrome: Secondary | ICD-10-CM | POA: Diagnosis not present

## 2020-11-05 DIAGNOSIS — F17210 Nicotine dependence, cigarettes, uncomplicated: Secondary | ICD-10-CM | POA: Diagnosis not present

## 2020-11-05 DIAGNOSIS — G8929 Other chronic pain: Secondary | ICD-10-CM | POA: Diagnosis not present

## 2020-11-05 DIAGNOSIS — J449 Chronic obstructive pulmonary disease, unspecified: Secondary | ICD-10-CM | POA: Diagnosis not present

## 2020-11-05 DIAGNOSIS — Z79899 Other long term (current) drug therapy: Secondary | ICD-10-CM | POA: Diagnosis not present

## 2020-11-05 DIAGNOSIS — Z6821 Body mass index (BMI) 21.0-21.9, adult: Secondary | ICD-10-CM | POA: Diagnosis not present

## 2020-11-08 DIAGNOSIS — Z7409 Other reduced mobility: Secondary | ICD-10-CM | POA: Diagnosis not present

## 2020-11-08 DIAGNOSIS — J441 Chronic obstructive pulmonary disease with (acute) exacerbation: Secondary | ICD-10-CM | POA: Diagnosis not present

## 2020-11-10 DIAGNOSIS — Z7409 Other reduced mobility: Secondary | ICD-10-CM | POA: Diagnosis not present

## 2020-11-10 DIAGNOSIS — J441 Chronic obstructive pulmonary disease with (acute) exacerbation: Secondary | ICD-10-CM | POA: Diagnosis not present

## 2020-11-15 DIAGNOSIS — J441 Chronic obstructive pulmonary disease with (acute) exacerbation: Secondary | ICD-10-CM | POA: Diagnosis not present

## 2020-11-15 DIAGNOSIS — Z7409 Other reduced mobility: Secondary | ICD-10-CM | POA: Diagnosis not present

## 2020-11-17 DIAGNOSIS — J441 Chronic obstructive pulmonary disease with (acute) exacerbation: Secondary | ICD-10-CM | POA: Diagnosis not present

## 2020-11-17 DIAGNOSIS — Z7409 Other reduced mobility: Secondary | ICD-10-CM | POA: Diagnosis not present

## 2020-11-22 DIAGNOSIS — Z7409 Other reduced mobility: Secondary | ICD-10-CM | POA: Diagnosis not present

## 2020-11-22 DIAGNOSIS — J441 Chronic obstructive pulmonary disease with (acute) exacerbation: Secondary | ICD-10-CM | POA: Diagnosis not present

## 2020-12-03 DIAGNOSIS — M1990 Unspecified osteoarthritis, unspecified site: Secondary | ICD-10-CM | POA: Diagnosis not present

## 2020-12-03 DIAGNOSIS — J441 Chronic obstructive pulmonary disease with (acute) exacerbation: Secondary | ICD-10-CM | POA: Diagnosis not present

## 2020-12-07 DIAGNOSIS — M545 Low back pain, unspecified: Secondary | ICD-10-CM | POA: Diagnosis not present

## 2020-12-07 DIAGNOSIS — G8929 Other chronic pain: Secondary | ICD-10-CM | POA: Diagnosis not present

## 2020-12-07 DIAGNOSIS — F17200 Nicotine dependence, unspecified, uncomplicated: Secondary | ICD-10-CM | POA: Diagnosis not present

## 2020-12-07 DIAGNOSIS — F17210 Nicotine dependence, cigarettes, uncomplicated: Secondary | ICD-10-CM | POA: Diagnosis not present

## 2020-12-07 DIAGNOSIS — Z6821 Body mass index (BMI) 21.0-21.9, adult: Secondary | ICD-10-CM | POA: Diagnosis not present

## 2020-12-07 DIAGNOSIS — R030 Elevated blood-pressure reading, without diagnosis of hypertension: Secondary | ICD-10-CM | POA: Diagnosis not present

## 2020-12-07 DIAGNOSIS — G894 Chronic pain syndrome: Secondary | ICD-10-CM | POA: Diagnosis not present

## 2020-12-07 DIAGNOSIS — Z79899 Other long term (current) drug therapy: Secondary | ICD-10-CM | POA: Diagnosis not present

## 2020-12-23 DIAGNOSIS — J449 Chronic obstructive pulmonary disease, unspecified: Secondary | ICD-10-CM | POA: Diagnosis not present

## 2020-12-23 DIAGNOSIS — M545 Low back pain, unspecified: Secondary | ICD-10-CM | POA: Diagnosis not present

## 2020-12-23 DIAGNOSIS — Z0000 Encounter for general adult medical examination without abnormal findings: Secondary | ICD-10-CM | POA: Diagnosis not present

## 2020-12-23 DIAGNOSIS — Z6821 Body mass index (BMI) 21.0-21.9, adult: Secondary | ICD-10-CM | POA: Diagnosis not present

## 2020-12-23 DIAGNOSIS — R7303 Prediabetes: Secondary | ICD-10-CM | POA: Diagnosis not present

## 2021-01-03 DIAGNOSIS — M1990 Unspecified osteoarthritis, unspecified site: Secondary | ICD-10-CM | POA: Diagnosis not present

## 2021-01-03 DIAGNOSIS — J441 Chronic obstructive pulmonary disease with (acute) exacerbation: Secondary | ICD-10-CM | POA: Diagnosis not present

## 2021-01-10 DIAGNOSIS — F17210 Nicotine dependence, cigarettes, uncomplicated: Secondary | ICD-10-CM | POA: Diagnosis not present

## 2021-01-10 DIAGNOSIS — F17200 Nicotine dependence, unspecified, uncomplicated: Secondary | ICD-10-CM | POA: Diagnosis not present

## 2021-01-10 DIAGNOSIS — Z79899 Other long term (current) drug therapy: Secondary | ICD-10-CM | POA: Diagnosis not present

## 2021-01-10 DIAGNOSIS — M545 Low back pain, unspecified: Secondary | ICD-10-CM | POA: Diagnosis not present

## 2021-01-10 DIAGNOSIS — R030 Elevated blood-pressure reading, without diagnosis of hypertension: Secondary | ICD-10-CM | POA: Diagnosis not present

## 2021-01-10 DIAGNOSIS — G894 Chronic pain syndrome: Secondary | ICD-10-CM | POA: Diagnosis not present

## 2021-01-10 DIAGNOSIS — Z6822 Body mass index (BMI) 22.0-22.9, adult: Secondary | ICD-10-CM | POA: Diagnosis not present

## 2021-01-10 DIAGNOSIS — G8929 Other chronic pain: Secondary | ICD-10-CM | POA: Diagnosis not present

## 2021-02-02 DIAGNOSIS — J441 Chronic obstructive pulmonary disease with (acute) exacerbation: Secondary | ICD-10-CM | POA: Diagnosis not present

## 2021-02-02 DIAGNOSIS — M1990 Unspecified osteoarthritis, unspecified site: Secondary | ICD-10-CM | POA: Diagnosis not present

## 2021-02-07 DIAGNOSIS — G894 Chronic pain syndrome: Secondary | ICD-10-CM | POA: Diagnosis not present

## 2021-02-07 DIAGNOSIS — E559 Vitamin D deficiency, unspecified: Secondary | ICD-10-CM | POA: Diagnosis not present

## 2021-02-07 DIAGNOSIS — F17200 Nicotine dependence, unspecified, uncomplicated: Secondary | ICD-10-CM | POA: Diagnosis not present

## 2021-02-07 DIAGNOSIS — F17210 Nicotine dependence, cigarettes, uncomplicated: Secondary | ICD-10-CM | POA: Diagnosis not present

## 2021-02-07 DIAGNOSIS — G8929 Other chronic pain: Secondary | ICD-10-CM | POA: Diagnosis not present

## 2021-02-07 DIAGNOSIS — Z79899 Other long term (current) drug therapy: Secondary | ICD-10-CM | POA: Diagnosis not present

## 2021-02-07 DIAGNOSIS — M545 Low back pain, unspecified: Secondary | ICD-10-CM | POA: Diagnosis not present

## 2021-02-07 DIAGNOSIS — R030 Elevated blood-pressure reading, without diagnosis of hypertension: Secondary | ICD-10-CM | POA: Diagnosis not present

## 2021-02-07 DIAGNOSIS — Z6821 Body mass index (BMI) 21.0-21.9, adult: Secondary | ICD-10-CM | POA: Diagnosis not present

## 2021-02-09 DIAGNOSIS — Z79899 Other long term (current) drug therapy: Secondary | ICD-10-CM | POA: Diagnosis not present

## 2021-02-20 DIAGNOSIS — Z20822 Contact with and (suspected) exposure to covid-19: Secondary | ICD-10-CM | POA: Diagnosis not present

## 2021-02-20 DIAGNOSIS — I10 Essential (primary) hypertension: Secondary | ICD-10-CM | POA: Diagnosis not present

## 2021-02-20 DIAGNOSIS — R911 Solitary pulmonary nodule: Secondary | ICD-10-CM | POA: Diagnosis not present

## 2021-02-20 DIAGNOSIS — R0600 Dyspnea, unspecified: Secondary | ICD-10-CM | POA: Diagnosis not present

## 2021-02-20 DIAGNOSIS — E876 Hypokalemia: Secondary | ICD-10-CM | POA: Diagnosis not present

## 2021-02-20 DIAGNOSIS — F17200 Nicotine dependence, unspecified, uncomplicated: Secondary | ICD-10-CM | POA: Diagnosis not present

## 2021-02-20 DIAGNOSIS — R0902 Hypoxemia: Secondary | ICD-10-CM | POA: Diagnosis not present

## 2021-02-20 DIAGNOSIS — R0602 Shortness of breath: Secondary | ICD-10-CM | POA: Diagnosis not present

## 2021-02-20 DIAGNOSIS — J449 Chronic obstructive pulmonary disease, unspecified: Secondary | ICD-10-CM | POA: Diagnosis not present

## 2021-03-01 DIAGNOSIS — Z131 Encounter for screening for diabetes mellitus: Secondary | ICD-10-CM | POA: Diagnosis not present

## 2021-03-01 DIAGNOSIS — E876 Hypokalemia: Secondary | ICD-10-CM | POA: Diagnosis not present

## 2021-03-01 DIAGNOSIS — I10 Essential (primary) hypertension: Secondary | ICD-10-CM | POA: Diagnosis not present

## 2021-03-01 DIAGNOSIS — Z6820 Body mass index (BMI) 20.0-20.9, adult: Secondary | ICD-10-CM | POA: Diagnosis not present

## 2021-03-01 DIAGNOSIS — R7303 Prediabetes: Secondary | ICD-10-CM | POA: Diagnosis not present

## 2021-03-01 DIAGNOSIS — Z13220 Encounter for screening for lipoid disorders: Secondary | ICD-10-CM | POA: Diagnosis not present

## 2021-03-01 DIAGNOSIS — J449 Chronic obstructive pulmonary disease, unspecified: Secondary | ICD-10-CM | POA: Diagnosis not present

## 2021-03-01 DIAGNOSIS — E559 Vitamin D deficiency, unspecified: Secondary | ICD-10-CM | POA: Diagnosis not present

## 2021-03-01 DIAGNOSIS — Z1159 Encounter for screening for other viral diseases: Secondary | ICD-10-CM | POA: Diagnosis not present

## 2021-03-01 DIAGNOSIS — Z1321 Encounter for screening for nutritional disorder: Secondary | ICD-10-CM | POA: Diagnosis not present

## 2021-03-05 DIAGNOSIS — E559 Vitamin D deficiency, unspecified: Secondary | ICD-10-CM | POA: Diagnosis not present

## 2021-03-05 DIAGNOSIS — J449 Chronic obstructive pulmonary disease, unspecified: Secondary | ICD-10-CM | POA: Diagnosis not present

## 2021-03-05 DIAGNOSIS — E876 Hypokalemia: Secondary | ICD-10-CM | POA: Diagnosis not present

## 2021-03-05 DIAGNOSIS — E781 Pure hyperglyceridemia: Secondary | ICD-10-CM | POA: Diagnosis not present

## 2021-03-05 DIAGNOSIS — M1990 Unspecified osteoarthritis, unspecified site: Secondary | ICD-10-CM | POA: Diagnosis not present

## 2021-03-05 DIAGNOSIS — Z1329 Encounter for screening for other suspected endocrine disorder: Secondary | ICD-10-CM | POA: Diagnosis not present

## 2021-03-05 DIAGNOSIS — J441 Chronic obstructive pulmonary disease with (acute) exacerbation: Secondary | ICD-10-CM | POA: Diagnosis not present

## 2021-03-05 DIAGNOSIS — I10 Essential (primary) hypertension: Secondary | ICD-10-CM | POA: Diagnosis not present

## 2021-03-05 DIAGNOSIS — Z6820 Body mass index (BMI) 20.0-20.9, adult: Secondary | ICD-10-CM | POA: Diagnosis not present

## 2021-03-07 DIAGNOSIS — M545 Low back pain, unspecified: Secondary | ICD-10-CM | POA: Diagnosis not present

## 2021-03-07 DIAGNOSIS — F17210 Nicotine dependence, cigarettes, uncomplicated: Secondary | ICD-10-CM | POA: Diagnosis not present

## 2021-03-07 DIAGNOSIS — F17200 Nicotine dependence, unspecified, uncomplicated: Secondary | ICD-10-CM | POA: Diagnosis not present

## 2021-03-07 DIAGNOSIS — G8929 Other chronic pain: Secondary | ICD-10-CM | POA: Diagnosis not present

## 2021-03-07 DIAGNOSIS — R030 Elevated blood-pressure reading, without diagnosis of hypertension: Secondary | ICD-10-CM | POA: Diagnosis not present

## 2021-03-07 DIAGNOSIS — G894 Chronic pain syndrome: Secondary | ICD-10-CM | POA: Diagnosis not present

## 2021-03-07 DIAGNOSIS — Z79899 Other long term (current) drug therapy: Secondary | ICD-10-CM | POA: Diagnosis not present

## 2021-04-01 DIAGNOSIS — J449 Chronic obstructive pulmonary disease, unspecified: Secondary | ICD-10-CM | POA: Diagnosis not present

## 2021-04-01 DIAGNOSIS — Z1389 Encounter for screening for other disorder: Secondary | ICD-10-CM | POA: Diagnosis not present

## 2021-04-01 DIAGNOSIS — J432 Centrilobular emphysema: Secondary | ICD-10-CM | POA: Diagnosis not present

## 2021-04-01 DIAGNOSIS — Z1329 Encounter for screening for other suspected endocrine disorder: Secondary | ICD-10-CM | POA: Diagnosis not present

## 2021-04-01 DIAGNOSIS — F17210 Nicotine dependence, cigarettes, uncomplicated: Secondary | ICD-10-CM | POA: Diagnosis not present

## 2021-04-01 DIAGNOSIS — R911 Solitary pulmonary nodule: Secondary | ICD-10-CM | POA: Diagnosis not present

## 2021-04-01 DIAGNOSIS — E781 Pure hyperglyceridemia: Secondary | ICD-10-CM | POA: Diagnosis not present

## 2021-04-01 DIAGNOSIS — I700 Atherosclerosis of aorta: Secondary | ICD-10-CM | POA: Diagnosis not present

## 2021-04-01 DIAGNOSIS — I10 Essential (primary) hypertension: Secondary | ICD-10-CM | POA: Diagnosis not present

## 2021-04-01 DIAGNOSIS — R918 Other nonspecific abnormal finding of lung field: Secondary | ICD-10-CM | POA: Diagnosis not present

## 2021-04-01 DIAGNOSIS — E559 Vitamin D deficiency, unspecified: Secondary | ICD-10-CM | POA: Diagnosis not present

## 2021-04-01 DIAGNOSIS — J439 Emphysema, unspecified: Secondary | ICD-10-CM | POA: Diagnosis not present

## 2021-04-04 DIAGNOSIS — M545 Low back pain, unspecified: Secondary | ICD-10-CM | POA: Diagnosis not present

## 2021-04-04 DIAGNOSIS — F17200 Nicotine dependence, unspecified, uncomplicated: Secondary | ICD-10-CM | POA: Diagnosis not present

## 2021-04-04 DIAGNOSIS — F17210 Nicotine dependence, cigarettes, uncomplicated: Secondary | ICD-10-CM | POA: Diagnosis not present

## 2021-04-04 DIAGNOSIS — G894 Chronic pain syndrome: Secondary | ICD-10-CM | POA: Diagnosis not present

## 2021-04-04 DIAGNOSIS — Z79899 Other long term (current) drug therapy: Secondary | ICD-10-CM | POA: Diagnosis not present

## 2021-04-04 DIAGNOSIS — Z6821 Body mass index (BMI) 21.0-21.9, adult: Secondary | ICD-10-CM | POA: Diagnosis not present

## 2021-04-04 DIAGNOSIS — R030 Elevated blood-pressure reading, without diagnosis of hypertension: Secondary | ICD-10-CM | POA: Diagnosis not present

## 2021-04-04 DIAGNOSIS — G8929 Other chronic pain: Secondary | ICD-10-CM | POA: Diagnosis not present

## 2021-04-04 DIAGNOSIS — Z9181 History of falling: Secondary | ICD-10-CM | POA: Diagnosis not present

## 2021-04-05 DIAGNOSIS — M1990 Unspecified osteoarthritis, unspecified site: Secondary | ICD-10-CM | POA: Diagnosis not present

## 2021-04-05 DIAGNOSIS — Z1329 Encounter for screening for other suspected endocrine disorder: Secondary | ICD-10-CM | POA: Diagnosis not present

## 2021-04-05 DIAGNOSIS — E876 Hypokalemia: Secondary | ICD-10-CM | POA: Diagnosis not present

## 2021-04-05 DIAGNOSIS — I10 Essential (primary) hypertension: Secondary | ICD-10-CM | POA: Diagnosis not present

## 2021-04-05 DIAGNOSIS — J441 Chronic obstructive pulmonary disease with (acute) exacerbation: Secondary | ICD-10-CM | POA: Diagnosis not present

## 2021-04-05 DIAGNOSIS — Z6821 Body mass index (BMI) 21.0-21.9, adult: Secondary | ICD-10-CM | POA: Diagnosis not present

## 2021-04-05 DIAGNOSIS — J449 Chronic obstructive pulmonary disease, unspecified: Secondary | ICD-10-CM | POA: Diagnosis not present

## 2021-04-05 DIAGNOSIS — F17210 Nicotine dependence, cigarettes, uncomplicated: Secondary | ICD-10-CM | POA: Diagnosis not present

## 2021-04-05 DIAGNOSIS — E559 Vitamin D deficiency, unspecified: Secondary | ICD-10-CM | POA: Diagnosis not present

## 2021-04-05 DIAGNOSIS — E781 Pure hyperglyceridemia: Secondary | ICD-10-CM | POA: Diagnosis not present

## 2021-04-06 NOTE — Progress Notes (Signed)
Synopsis: Referred for pulmonary nodule by Spivey Nation, MD  Subjective:   PATIENT ID: Marianna: female DOB: 1950-11-08, MRN: [id number]  Chief Complaint  Patient presents with   Consult    Patient reports she has some shortness of breath on exertion and she reports some increased heart rate.    65yM with COPD referred for pulmonary nodules (dominant 1.6cm RML decreased from  2.30m on 04/01/21 scan)  She smoked 50 years (~50 py smoking) active, down to 1 cigarette per day. She does have quite a bit of chest congestion. Finished course of antibiotics on Monday and that has helped quite a bit. She has no trouble swallowing, no aspiration. She has had no fever, she has had no weight loss, no night sweats drenching the sheets.   She has DOE to 50 yards. She is on trelegy. She has never done pulmonary rehab.   She has never had a bronchoscopy or TTNA.   Otherwise pertinent review of systems is negative.  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 She has no pets at home. No pet bird. No hot tub   Past Medical History:  Diagnosis Date   COPD (chronic obstructive pulmonary disease) (HCC)    DDD (degenerative disc disease)    DJD (degenerative joint disease)    DVT femoral (deep venous thrombosis) with thrombophlebitis ([place]      Family History  Problem Relation Age of Onset   Deep vein thrombosis Mother    Other Mother        varicose veins   Hypertension Sister    Other Sister        varicose veins   Cancer Brother      Past Surgical History:  Procedure Laterality Date   TOTAL HIP ARTHROPLASTY Right    TUBAL LIGATION  10/1971    Social History   Socioeconomic History   Marital status: Widowed    Spouse name: Not on file   Number of children: 2   Years of education: 11  Highest education level: Not  on file  Occupational History   Occupation: homemaker  Tobacco Use   Smoking status: Every Day    Packs/day: 0.50    Years: 40.00    Pack years: 20.00    Types: Cigarettes   Smokeless tobacco: Never   Tobacco comments:    Patient is down to 1 cig a day for 1 week. HSM.   Substance and Sexual Activity   Alcohol use: No    Alcohol/week: 0.0 standard drinks   Drug use: No   Sexual activity: Not on file  Other Topics Concern   Not on file  Social History Narrative   Not on file   Social Determinants of Health   Financial Resource Strain: Not on file  Food Insecurity: Not on file  Transportation Needs: Not on file  Physical Activity: Not on file  Stress: Not on file  Social Connections: Not on file  Intimate Partner Violence: Not on file     No Known Allergies   Outpatient Medications Prior to Visit  Medication Sig Dispense Refill   albuterol (PROVENTIL HFA;VENTOLIN HFA) 108 (90 Base) MCG/ACT inhaler Inhale 2 puffs into the lungs every 6 (six) hours as needed for wheezing or shortness of breath. 1 Inhaler 2   albuterol (PROVENTIL) (2.5 MG/3ML)  0.083% nebulizer solution Take 3 mLs (2.5 mg total) by nebulization every 6 (six) hours as needed for wheezing or shortness of breath. 75 mL 12   ALPRAZolam (XANAX) 0.5 MG tablet Take 0.5 mg by mouth 3 (three) times daily as needed for anxiety.      amLODipine (NORVASC) 5 MG tablet Take 1 tablet (5 mg total) by mouth daily. 30 tablet 0   amoxicillin-clavulanate (AUGMENTIN) 875-125 MG tablet Take 1 tablet by mouth 2 (two) times daily.     diltiazem (CARDIZEM CD) 120 MG 24 hr capsule Take 120 mg by mouth daily.     oxycodone (ROXICODONE) 30 MG immediate release tablet Take 15-30 mg by mouth every 4 (four) hours as needed for pain.      TRELEGY ELLIPTA 100-62.5-25 MCG/INH AEPB Inhale 1 puff into the lungs daily.     budesonide-formoterol (SYMBICORT) 160-4.5 MCG/ACT inhaler Inhale 2 puffs into the lungs 2 (two) times daily. (Patient not  taking: Reported on 04/07/2021)     doxycycline (VIBRAMYCIN) 100 MG capsule Take 1 capsule (100 mg total) by mouth 2 (two) times daily. One po bid x 10 days (Patient not taking: Reported on 04/07/2021) 20 capsule 0   methylPREDNISolone (MEDROL DOSEPAK) 4 MG TBPK tablet See admin instructions. Take as directed.     predniSONE (DELTASONE) 10 MG tablet Take q day 6, 6,5,5,4,4,3,3,2,2,1,1 (Patient not taking: Reported on 04/07/2021) 42 tablet 0   No facility-administered medications prior to visit.       Objective:   Physical Exam:  General appearance: 70 y.o., female, NAD, conversant, chroincally ill appearing Eyes: anicteric sclerae, moist conjunctivae; no lid-lag; PERRL, tracking appropriately HENT: NCAT; oropharynx, MMM, no mucosal ulcerations; normal hard and soft palate, poor dentition Neck: Trachea midline; no lymphadenopathy, no JVD Lungs: diminished b/l, no crackles, no wheeze, with normal respiratory effort CV: RRR, no MRGs  Abdomen: Soft, non-tender; non-distended, BS present  Extremities: No peripheral edema, radial and DP pulses present bilaterally  Skin: Normal temperature, turgor and texture; no rash Psych: Appropriate affect Neuro: Alert and oriented to person and place, no focal deficit    Vitals:   04/07/21 1409  BP: 124/70  Pulse: 89  Temp: 98.1 F (36.7 C)  TempSrc: Oral  SpO2: 99%  Weight: 126 lb (57.2 kg)  Height: _0  (1.626 m)   99% on RA BMI Readings from Last 3 Encounters:  04/07/21 21.63 kg/m  08/01/17 23.69 kg/m  10/31/16 23.10 kg/m   Wt Readings from Last 3 Encounters:  04/07/21 126 lb (57.2 kg)  08/01/17 138 lb (62.6 kg)  10/31/16 134 lb 9.6 oz (61.1 kg)     CBC    Component Value Date/Time   WBC 5.4 08/01/2017 1012   RBC 4.38 08/01/2017 1012   HGB 13.6 08/01/2017 1012   HCT 43.0 08/01/2017 1012   PLT 175 08/01/2017 1012   MCV 98.2 08/01/2017 1012   MCH 31.1 08/01/2017 1012   MCHC 31.6 08/01/2017 1012   RDW 13.4 08/01/2017 1012    LYMPHSABS 0.7 08/01/2017 1012   MONOABS 0.4 08/01/2017 1012   EOSABS 0.0 08/01/2017 1012   BASOSABS 0.0 08/01/2017 1012    Chest Imaging:  CTA 10/2016 reviewed by me and remarkable for significant TIB nodularity esp RML, RLL, smaller RLLL cavitary lesion  CT Chest 04/01/21 reviewed by me and remarkable for  Interval decrease in size of RML nodule relative to CTA Chest 01/2021  Pulmonary Functions Testing Results: No flowsheet data found.  Assessment & Plan:   # Pulmonary nodules:  Appearance and waxing/waning size with background of TIB overall suggestive of cavitary NTM disease. While size of dominant nodule is smaller from 08/2020 to 03/2021 and may be related to something like NTM, appearance with her smoking history is still concerning to me. We talked about risks and benefits of proceeding with invasive workup vs surveillance with CT Chest and we will tentatively proceed with CT CHest in 6 months (given prior stability over 7 months), declines invasive workup at present despite risk that there could be cancer progression if nodule instead cancerous. She also declines bronch/BAL for NTM disease.   # Likely COPD  # Smoking  Plan: - CT Chest in 6 months - AFB sputum, sputum culture - flutter valve 10 slow but firm puffs daily after trelegy - continue trelegy - declines PFTs - smoking cessation - she will try on her own - not sure if she makes the best lung cancer screening candidate with her background of likely NTM which will confound the picture frequently         Maryjane Hurter, MD [facility] 04/07/2021 2:25 PM

## 2021-04-07 ENCOUNTER — Other Ambulatory Visit: Payer: Self-pay

## 2021-04-07 ENCOUNTER — Encounter: Payer: Self-pay | Admitting: Student

## 2021-04-07 ENCOUNTER — Ambulatory Visit (INDEPENDENT_AMBULATORY_CARE_PROVIDER_SITE_OTHER): Payer: Medicare Other | Admitting: Student

## 2021-04-07 VITALS — BP 124/70 | HR 89 | Temp 98.1°F | Ht 64.0 in | Wt 126.0 lb

## 2021-04-07 DIAGNOSIS — R918 Other nonspecific abnormal finding of lung field: Secondary | ICD-10-CM

## 2021-04-07 DIAGNOSIS — R053 Chronic cough: Secondary | ICD-10-CM

## 2021-04-07 NOTE — Patient Instructions (Addendum)
-   ct chest in 6 months - bring sputum sample to the lab to check for infection, in particular nontuberculous mycobacteria. The other way to look for this infection is bronchoscopy - flutter valve 10 slow but firm puffs daily after your trelegy

## 2021-04-13 ENCOUNTER — Other Ambulatory Visit: Payer: Self-pay | Admitting: Student

## 2021-04-13 ENCOUNTER — Telehealth: Payer: Self-pay | Admitting: Student

## 2021-04-13 ENCOUNTER — Other Ambulatory Visit: Payer: Self-pay

## 2021-04-13 DIAGNOSIS — R053 Chronic cough: Secondary | ICD-10-CM

## 2021-04-13 NOTE — Telephone Encounter (Signed)
Patient came in to [facility] office today to get labs done (rec. By Ginette Otto office personnel per pt). She did not have an order for the [facility] phlebotomist to accept the sputum sample. The lab order for the specimen is for pick up with [facility]. The lab tech accepted the sputum culture but needs another order placed for [facility] to accept it. She wanted to know if the sputum culture for collection needed a gram stain as well?   Dr. Thora Lance please advise if its okay for me to change the order to Sputum Culture (gram stain w/ sputum cult rflx) Lab [id number] so that [facility] can accept the sputum culture.   Thanks!

## 2021-04-13 NOTE — Telephone Encounter (Signed)
Dr. Thora Lance,   Can you also verify that it is okay for the patient to see a provider at the [facility] office for convenience from now on?   Thanks!

## 2021-04-13 NOTE — Telephone Encounter (Signed)
Order changed and lab tech collected sputum culture.

## 2021-04-14 LAB — GRAM STAIN W/SPUTUM CULT RFLX

## 2021-05-02 DIAGNOSIS — M545 Low back pain, unspecified: Secondary | ICD-10-CM | POA: Diagnosis not present

## 2021-05-02 DIAGNOSIS — J449 Chronic obstructive pulmonary disease, unspecified: Secondary | ICD-10-CM | POA: Diagnosis not present

## 2021-05-02 DIAGNOSIS — G894 Chronic pain syndrome: Secondary | ICD-10-CM | POA: Diagnosis not present

## 2021-05-02 DIAGNOSIS — F17200 Nicotine dependence, unspecified, uncomplicated: Secondary | ICD-10-CM | POA: Diagnosis not present

## 2021-05-02 DIAGNOSIS — E1142 Type 2 diabetes mellitus with diabetic polyneuropathy: Secondary | ICD-10-CM | POA: Diagnosis not present

## 2021-05-02 DIAGNOSIS — R030 Elevated blood-pressure reading, without diagnosis of hypertension: Secondary | ICD-10-CM | POA: Diagnosis not present

## 2021-05-02 DIAGNOSIS — F17210 Nicotine dependence, cigarettes, uncomplicated: Secondary | ICD-10-CM | POA: Diagnosis not present

## 2021-05-02 DIAGNOSIS — Z79899 Other long term (current) drug therapy: Secondary | ICD-10-CM | POA: Diagnosis not present

## 2021-05-02 DIAGNOSIS — Z6820 Body mass index (BMI) 20.0-20.9, adult: Secondary | ICD-10-CM | POA: Diagnosis not present

## 2021-05-03 DIAGNOSIS — R911 Solitary pulmonary nodule: Secondary | ICD-10-CM | POA: Diagnosis not present

## 2021-05-03 DIAGNOSIS — E876 Hypokalemia: Secondary | ICD-10-CM | POA: Diagnosis not present

## 2021-05-03 DIAGNOSIS — E781 Pure hyperglyceridemia: Secondary | ICD-10-CM | POA: Diagnosis not present

## 2021-05-03 DIAGNOSIS — E559 Vitamin D deficiency, unspecified: Secondary | ICD-10-CM | POA: Diagnosis not present

## 2021-05-03 DIAGNOSIS — Z1329 Encounter for screening for other suspected endocrine disorder: Secondary | ICD-10-CM | POA: Diagnosis not present

## 2021-05-03 DIAGNOSIS — J449 Chronic obstructive pulmonary disease, unspecified: Secondary | ICD-10-CM | POA: Diagnosis not present

## 2021-05-03 DIAGNOSIS — I10 Essential (primary) hypertension: Secondary | ICD-10-CM | POA: Diagnosis not present

## 2021-05-03 DIAGNOSIS — Z6820 Body mass index (BMI) 20.0-20.9, adult: Secondary | ICD-10-CM | POA: Diagnosis not present

## 2021-05-05 DIAGNOSIS — J441 Chronic obstructive pulmonary disease with (acute) exacerbation: Secondary | ICD-10-CM | POA: Diagnosis not present

## 2021-05-05 DIAGNOSIS — M1990 Unspecified osteoarthritis, unspecified site: Secondary | ICD-10-CM | POA: Diagnosis not present

## 2021-05-18 DIAGNOSIS — J449 Chronic obstructive pulmonary disease, unspecified: Secondary | ICD-10-CM | POA: Diagnosis not present

## 2021-05-18 DIAGNOSIS — E876 Hypokalemia: Secondary | ICD-10-CM | POA: Diagnosis not present

## 2021-05-18 DIAGNOSIS — E781 Pure hyperglyceridemia: Secondary | ICD-10-CM | POA: Diagnosis not present

## 2021-05-18 DIAGNOSIS — I10 Essential (primary) hypertension: Secondary | ICD-10-CM | POA: Diagnosis not present

## 2021-05-25 DIAGNOSIS — F17210 Nicotine dependence, cigarettes, uncomplicated: Secondary | ICD-10-CM | POA: Diagnosis not present

## 2021-05-25 DIAGNOSIS — Z6820 Body mass index (BMI) 20.0-20.9, adult: Secondary | ICD-10-CM | POA: Diagnosis not present

## 2021-05-25 DIAGNOSIS — R911 Solitary pulmonary nodule: Secondary | ICD-10-CM | POA: Diagnosis not present

## 2021-05-25 DIAGNOSIS — Z1329 Encounter for screening for other suspected endocrine disorder: Secondary | ICD-10-CM | POA: Diagnosis not present

## 2021-05-25 DIAGNOSIS — J449 Chronic obstructive pulmonary disease, unspecified: Secondary | ICD-10-CM | POA: Diagnosis not present

## 2021-05-25 DIAGNOSIS — E559 Vitamin D deficiency, unspecified: Secondary | ICD-10-CM | POA: Diagnosis not present

## 2021-05-25 DIAGNOSIS — I10 Essential (primary) hypertension: Secondary | ICD-10-CM | POA: Diagnosis not present

## 2021-05-25 DIAGNOSIS — E781 Pure hyperglyceridemia: Secondary | ICD-10-CM | POA: Diagnosis not present

## 2021-05-30 DIAGNOSIS — G8929 Other chronic pain: Secondary | ICD-10-CM | POA: Diagnosis not present

## 2021-05-30 DIAGNOSIS — Z6820 Body mass index (BMI) 20.0-20.9, adult: Secondary | ICD-10-CM | POA: Diagnosis not present

## 2021-05-30 DIAGNOSIS — F17200 Nicotine dependence, unspecified, uncomplicated: Secondary | ICD-10-CM | POA: Diagnosis not present

## 2021-05-30 DIAGNOSIS — J449 Chronic obstructive pulmonary disease, unspecified: Secondary | ICD-10-CM | POA: Diagnosis not present

## 2021-05-30 DIAGNOSIS — Z79899 Other long term (current) drug therapy: Secondary | ICD-10-CM | POA: Diagnosis not present

## 2021-05-30 DIAGNOSIS — F17210 Nicotine dependence, cigarettes, uncomplicated: Secondary | ICD-10-CM | POA: Diagnosis not present

## 2021-05-30 DIAGNOSIS — R030 Elevated blood-pressure reading, without diagnosis of hypertension: Secondary | ICD-10-CM | POA: Diagnosis not present

## 2021-05-30 DIAGNOSIS — G894 Chronic pain syndrome: Secondary | ICD-10-CM | POA: Diagnosis not present

## 2021-05-30 DIAGNOSIS — M545 Low back pain, unspecified: Secondary | ICD-10-CM | POA: Diagnosis not present

## 2021-06-01 LAB — AFB CULTURE WITH SMEAR (NOT AT ARMC)
Acid Fast Culture: NEGATIVE
Acid Fast Smear: NEGATIVE

## 2021-06-05 DIAGNOSIS — J441 Chronic obstructive pulmonary disease with (acute) exacerbation: Secondary | ICD-10-CM | POA: Diagnosis not present

## 2021-06-05 DIAGNOSIS — M1990 Unspecified osteoarthritis, unspecified site: Secondary | ICD-10-CM | POA: Diagnosis not present

## 2021-06-07 DIAGNOSIS — E559 Vitamin D deficiency, unspecified: Secondary | ICD-10-CM | POA: Diagnosis not present

## 2021-06-07 DIAGNOSIS — E781 Pure hyperglyceridemia: Secondary | ICD-10-CM | POA: Diagnosis not present

## 2021-06-07 DIAGNOSIS — E876 Hypokalemia: Secondary | ICD-10-CM | POA: Diagnosis not present

## 2021-06-07 DIAGNOSIS — J449 Chronic obstructive pulmonary disease, unspecified: Secondary | ICD-10-CM | POA: Diagnosis not present

## 2021-06-07 DIAGNOSIS — I10 Essential (primary) hypertension: Secondary | ICD-10-CM | POA: Diagnosis not present

## 2021-06-29 DIAGNOSIS — G8929 Other chronic pain: Secondary | ICD-10-CM | POA: Diagnosis not present

## 2021-06-29 DIAGNOSIS — F17200 Nicotine dependence, unspecified, uncomplicated: Secondary | ICD-10-CM | POA: Diagnosis not present

## 2021-06-29 DIAGNOSIS — M545 Low back pain, unspecified: Secondary | ICD-10-CM | POA: Diagnosis not present

## 2021-06-29 DIAGNOSIS — R030 Elevated blood-pressure reading, without diagnosis of hypertension: Secondary | ICD-10-CM | POA: Diagnosis not present

## 2021-06-29 DIAGNOSIS — Z79899 Other long term (current) drug therapy: Secondary | ICD-10-CM | POA: Diagnosis not present

## 2021-06-29 DIAGNOSIS — Z6821 Body mass index (BMI) 21.0-21.9, adult: Secondary | ICD-10-CM | POA: Diagnosis not present

## 2021-06-29 DIAGNOSIS — G894 Chronic pain syndrome: Secondary | ICD-10-CM | POA: Diagnosis not present

## 2021-06-29 DIAGNOSIS — F17210 Nicotine dependence, cigarettes, uncomplicated: Secondary | ICD-10-CM | POA: Diagnosis not present

## 2021-06-29 DIAGNOSIS — J449 Chronic obstructive pulmonary disease, unspecified: Secondary | ICD-10-CM | POA: Diagnosis not present

## 2021-07-05 DIAGNOSIS — M1990 Unspecified osteoarthritis, unspecified site: Secondary | ICD-10-CM | POA: Diagnosis not present

## 2021-07-05 DIAGNOSIS — Z79899 Other long term (current) drug therapy: Secondary | ICD-10-CM | POA: Diagnosis not present

## 2021-07-05 DIAGNOSIS — J441 Chronic obstructive pulmonary disease with (acute) exacerbation: Secondary | ICD-10-CM | POA: Diagnosis not present

## 2021-07-26 DIAGNOSIS — J441 Chronic obstructive pulmonary disease with (acute) exacerbation: Secondary | ICD-10-CM | POA: Diagnosis not present

## 2021-07-26 DIAGNOSIS — Z6820 Body mass index (BMI) 20.0-20.9, adult: Secondary | ICD-10-CM | POA: Diagnosis not present

## 2021-07-26 DIAGNOSIS — R300 Dysuria: Secondary | ICD-10-CM | POA: Diagnosis not present

## 2021-08-01 DIAGNOSIS — G894 Chronic pain syndrome: Secondary | ICD-10-CM | POA: Diagnosis not present

## 2021-08-01 DIAGNOSIS — F17200 Nicotine dependence, unspecified, uncomplicated: Secondary | ICD-10-CM | POA: Diagnosis not present

## 2021-08-01 DIAGNOSIS — Z6821 Body mass index (BMI) 21.0-21.9, adult: Secondary | ICD-10-CM | POA: Diagnosis not present

## 2021-08-01 DIAGNOSIS — M545 Low back pain, unspecified: Secondary | ICD-10-CM | POA: Diagnosis not present

## 2021-08-01 DIAGNOSIS — F17210 Nicotine dependence, cigarettes, uncomplicated: Secondary | ICD-10-CM | POA: Diagnosis not present

## 2021-08-01 DIAGNOSIS — R030 Elevated blood-pressure reading, without diagnosis of hypertension: Secondary | ICD-10-CM | POA: Diagnosis not present

## 2021-08-01 DIAGNOSIS — Z79899 Other long term (current) drug therapy: Secondary | ICD-10-CM | POA: Diagnosis not present

## 2021-08-01 DIAGNOSIS — M539 Dorsopathy, unspecified: Secondary | ICD-10-CM | POA: Diagnosis not present

## 2021-08-01 DIAGNOSIS — G8929 Other chronic pain: Secondary | ICD-10-CM | POA: Diagnosis not present

## 2021-08-02 DIAGNOSIS — F17210 Nicotine dependence, cigarettes, uncomplicated: Secondary | ICD-10-CM | POA: Diagnosis not present

## 2021-08-02 DIAGNOSIS — J441 Chronic obstructive pulmonary disease with (acute) exacerbation: Secondary | ICD-10-CM | POA: Diagnosis not present

## 2021-08-02 DIAGNOSIS — Z6821 Body mass index (BMI) 21.0-21.9, adult: Secondary | ICD-10-CM | POA: Diagnosis not present

## 2021-08-26 DIAGNOSIS — S51819A Laceration without foreign body of unspecified forearm, initial encounter: Secondary | ICD-10-CM | POA: Diagnosis not present

## 2021-08-26 DIAGNOSIS — J449 Chronic obstructive pulmonary disease, unspecified: Secondary | ICD-10-CM | POA: Diagnosis not present

## 2021-08-26 DIAGNOSIS — R911 Solitary pulmonary nodule: Secondary | ICD-10-CM | POA: Diagnosis not present

## 2021-08-26 DIAGNOSIS — E781 Pure hyperglyceridemia: Secondary | ICD-10-CM | POA: Diagnosis not present

## 2021-08-26 DIAGNOSIS — I10 Essential (primary) hypertension: Secondary | ICD-10-CM | POA: Diagnosis not present

## 2021-08-26 DIAGNOSIS — Z1329 Encounter for screening for other suspected endocrine disorder: Secondary | ICD-10-CM | POA: Diagnosis not present

## 2021-08-26 DIAGNOSIS — E559 Vitamin D deficiency, unspecified: Secondary | ICD-10-CM | POA: Diagnosis not present

## 2021-08-26 DIAGNOSIS — Z23 Encounter for immunization: Secondary | ICD-10-CM | POA: Diagnosis not present

## 2021-08-31 DIAGNOSIS — Z79899 Other long term (current) drug therapy: Secondary | ICD-10-CM | POA: Diagnosis not present

## 2021-08-31 DIAGNOSIS — F17210 Nicotine dependence, cigarettes, uncomplicated: Secondary | ICD-10-CM | POA: Diagnosis not present

## 2021-08-31 DIAGNOSIS — G894 Chronic pain syndrome: Secondary | ICD-10-CM | POA: Diagnosis not present

## 2021-08-31 DIAGNOSIS — F17200 Nicotine dependence, unspecified, uncomplicated: Secondary | ICD-10-CM | POA: Diagnosis not present

## 2021-08-31 DIAGNOSIS — M545 Low back pain, unspecified: Secondary | ICD-10-CM | POA: Diagnosis not present

## 2021-08-31 DIAGNOSIS — Z0000 Encounter for general adult medical examination without abnormal findings: Secondary | ICD-10-CM | POA: Diagnosis not present

## 2021-08-31 DIAGNOSIS — M539 Dorsopathy, unspecified: Secondary | ICD-10-CM | POA: Diagnosis not present

## 2021-08-31 DIAGNOSIS — R030 Elevated blood-pressure reading, without diagnosis of hypertension: Secondary | ICD-10-CM | POA: Diagnosis not present

## 2021-08-31 DIAGNOSIS — Z6821 Body mass index (BMI) 21.0-21.9, adult: Secondary | ICD-10-CM | POA: Diagnosis not present

## 2021-08-31 DIAGNOSIS — G8929 Other chronic pain: Secondary | ICD-10-CM | POA: Diagnosis not present

## 2021-09-26 ENCOUNTER — Ambulatory Visit (HOSPITAL_COMMUNITY): Admission: RE | Admit: 2021-09-26 | Payer: Medicare Other | Source: Ambulatory Visit

## 2021-09-28 DIAGNOSIS — E559 Vitamin D deficiency, unspecified: Secondary | ICD-10-CM | POA: Diagnosis not present

## 2021-09-28 DIAGNOSIS — M539 Dorsopathy, unspecified: Secondary | ICD-10-CM | POA: Diagnosis not present

## 2021-09-28 DIAGNOSIS — F17200 Nicotine dependence, unspecified, uncomplicated: Secondary | ICD-10-CM | POA: Diagnosis not present

## 2021-09-28 DIAGNOSIS — M545 Low back pain, unspecified: Secondary | ICD-10-CM | POA: Diagnosis not present

## 2021-09-28 DIAGNOSIS — R030 Elevated blood-pressure reading, without diagnosis of hypertension: Secondary | ICD-10-CM | POA: Diagnosis not present

## 2021-09-28 DIAGNOSIS — Z6821 Body mass index (BMI) 21.0-21.9, adult: Secondary | ICD-10-CM | POA: Diagnosis not present

## 2021-09-28 DIAGNOSIS — Z79899 Other long term (current) drug therapy: Secondary | ICD-10-CM | POA: Diagnosis not present

## 2021-09-28 DIAGNOSIS — G894 Chronic pain syndrome: Secondary | ICD-10-CM | POA: Diagnosis not present

## 2021-09-28 DIAGNOSIS — F17210 Nicotine dependence, cigarettes, uncomplicated: Secondary | ICD-10-CM | POA: Diagnosis not present

## 2021-09-28 DIAGNOSIS — G8929 Other chronic pain: Secondary | ICD-10-CM | POA: Diagnosis not present

## 2021-10-03 DIAGNOSIS — J441 Chronic obstructive pulmonary disease with (acute) exacerbation: Secondary | ICD-10-CM | POA: Diagnosis not present

## 2021-10-03 DIAGNOSIS — M1990 Unspecified osteoarthritis, unspecified site: Secondary | ICD-10-CM | POA: Diagnosis not present

## 2021-10-24 DIAGNOSIS — F17200 Nicotine dependence, unspecified, uncomplicated: Secondary | ICD-10-CM | POA: Diagnosis not present

## 2021-10-24 DIAGNOSIS — M539 Dorsopathy, unspecified: Secondary | ICD-10-CM | POA: Diagnosis not present

## 2021-10-24 DIAGNOSIS — R030 Elevated blood-pressure reading, without diagnosis of hypertension: Secondary | ICD-10-CM | POA: Diagnosis not present

## 2021-10-24 DIAGNOSIS — Z79899 Other long term (current) drug therapy: Secondary | ICD-10-CM | POA: Diagnosis not present

## 2021-10-24 DIAGNOSIS — Z6822 Body mass index (BMI) 22.0-22.9, adult: Secondary | ICD-10-CM | POA: Diagnosis not present

## 2021-10-24 DIAGNOSIS — F17210 Nicotine dependence, cigarettes, uncomplicated: Secondary | ICD-10-CM | POA: Diagnosis not present

## 2021-10-24 DIAGNOSIS — M545 Low back pain, unspecified: Secondary | ICD-10-CM | POA: Diagnosis not present

## 2021-10-24 DIAGNOSIS — G894 Chronic pain syndrome: Secondary | ICD-10-CM | POA: Diagnosis not present

## 2021-10-24 DIAGNOSIS — G8929 Other chronic pain: Secondary | ICD-10-CM | POA: Diagnosis not present

## 2021-11-24 DIAGNOSIS — F17200 Nicotine dependence, unspecified, uncomplicated: Secondary | ICD-10-CM | POA: Diagnosis not present

## 2021-11-24 DIAGNOSIS — G894 Chronic pain syndrome: Secondary | ICD-10-CM | POA: Diagnosis not present

## 2021-11-24 DIAGNOSIS — Z79899 Other long term (current) drug therapy: Secondary | ICD-10-CM | POA: Diagnosis not present

## 2021-11-24 DIAGNOSIS — M539 Dorsopathy, unspecified: Secondary | ICD-10-CM | POA: Diagnosis not present

## 2021-11-24 DIAGNOSIS — F17210 Nicotine dependence, cigarettes, uncomplicated: Secondary | ICD-10-CM | POA: Diagnosis not present

## 2021-11-24 DIAGNOSIS — Z6821 Body mass index (BMI) 21.0-21.9, adult: Secondary | ICD-10-CM | POA: Diagnosis not present

## 2021-11-24 DIAGNOSIS — M545 Low back pain, unspecified: Secondary | ICD-10-CM | POA: Diagnosis not present

## 2021-11-24 DIAGNOSIS — G8929 Other chronic pain: Secondary | ICD-10-CM | POA: Diagnosis not present

## 2021-11-24 DIAGNOSIS — R030 Elevated blood-pressure reading, without diagnosis of hypertension: Secondary | ICD-10-CM | POA: Diagnosis not present

## 2021-12-26 DIAGNOSIS — Z6821 Body mass index (BMI) 21.0-21.9, adult: Secondary | ICD-10-CM | POA: Diagnosis not present

## 2021-12-26 DIAGNOSIS — Z79899 Other long term (current) drug therapy: Secondary | ICD-10-CM | POA: Diagnosis not present

## 2021-12-26 DIAGNOSIS — M545 Low back pain, unspecified: Secondary | ICD-10-CM | POA: Diagnosis not present

## 2021-12-26 DIAGNOSIS — G894 Chronic pain syndrome: Secondary | ICD-10-CM | POA: Diagnosis not present

## 2021-12-26 DIAGNOSIS — R030 Elevated blood-pressure reading, without diagnosis of hypertension: Secondary | ICD-10-CM | POA: Diagnosis not present

## 2021-12-26 DIAGNOSIS — M539 Dorsopathy, unspecified: Secondary | ICD-10-CM | POA: Diagnosis not present

## 2021-12-26 DIAGNOSIS — G8929 Other chronic pain: Secondary | ICD-10-CM | POA: Diagnosis not present

## 2022-01-26 DIAGNOSIS — J449 Chronic obstructive pulmonary disease, unspecified: Secondary | ICD-10-CM | POA: Diagnosis not present

## 2022-01-26 DIAGNOSIS — G894 Chronic pain syndrome: Secondary | ICD-10-CM | POA: Diagnosis not present

## 2022-01-26 DIAGNOSIS — E1142 Type 2 diabetes mellitus with diabetic polyneuropathy: Secondary | ICD-10-CM | POA: Diagnosis not present

## 2022-01-26 DIAGNOSIS — G8929 Other chronic pain: Secondary | ICD-10-CM | POA: Diagnosis not present

## 2022-01-26 DIAGNOSIS — Z6821 Body mass index (BMI) 21.0-21.9, adult: Secondary | ICD-10-CM | POA: Diagnosis not present

## 2022-01-26 DIAGNOSIS — R030 Elevated blood-pressure reading, without diagnosis of hypertension: Secondary | ICD-10-CM | POA: Diagnosis not present

## 2022-01-26 DIAGNOSIS — M545 Low back pain, unspecified: Secondary | ICD-10-CM | POA: Diagnosis not present

## 2022-01-26 DIAGNOSIS — Z9181 History of falling: Secondary | ICD-10-CM | POA: Diagnosis not present

## 2022-01-26 DIAGNOSIS — M539 Dorsopathy, unspecified: Secondary | ICD-10-CM | POA: Diagnosis not present

## 2022-01-26 DIAGNOSIS — Z79899 Other long term (current) drug therapy: Secondary | ICD-10-CM | POA: Diagnosis not present

## 2022-01-28 DIAGNOSIS — R000 Tachycardia, unspecified: Secondary | ICD-10-CM | POA: Diagnosis not present

## 2022-01-28 DIAGNOSIS — R030 Elevated blood-pressure reading, without diagnosis of hypertension: Secondary | ICD-10-CM | POA: Diagnosis not present

## 2022-01-28 DIAGNOSIS — Z6820 Body mass index (BMI) 20.0-20.9, adult: Secondary | ICD-10-CM | POA: Diagnosis not present

## 2022-01-28 DIAGNOSIS — T148XXA Other injury of unspecified body region, initial encounter: Secondary | ICD-10-CM | POA: Diagnosis not present

## 2022-01-30 DIAGNOSIS — T148XXA Other injury of unspecified body region, initial encounter: Secondary | ICD-10-CM | POA: Diagnosis not present

## 2022-02-03 DIAGNOSIS — T148XXA Other injury of unspecified body region, initial encounter: Secondary | ICD-10-CM | POA: Diagnosis not present

## 2022-02-03 DIAGNOSIS — R000 Tachycardia, unspecified: Secondary | ICD-10-CM | POA: Diagnosis not present

## 2022-02-03 DIAGNOSIS — Z6820 Body mass index (BMI) 20.0-20.9, adult: Secondary | ICD-10-CM | POA: Diagnosis not present

## 2022-02-16 DIAGNOSIS — J449 Chronic obstructive pulmonary disease, unspecified: Secondary | ICD-10-CM | POA: Diagnosis not present

## 2022-02-16 DIAGNOSIS — E876 Hypokalemia: Secondary | ICD-10-CM | POA: Diagnosis not present

## 2022-02-16 DIAGNOSIS — Z13220 Encounter for screening for lipoid disorders: Secondary | ICD-10-CM | POA: Diagnosis not present

## 2022-02-16 DIAGNOSIS — Z1329 Encounter for screening for other suspected endocrine disorder: Secondary | ICD-10-CM | POA: Diagnosis not present

## 2022-02-16 DIAGNOSIS — E559 Vitamin D deficiency, unspecified: Secondary | ICD-10-CM | POA: Diagnosis not present

## 2022-02-17 DIAGNOSIS — J449 Chronic obstructive pulmonary disease, unspecified: Secondary | ICD-10-CM | POA: Diagnosis not present

## 2022-02-17 DIAGNOSIS — Z1329 Encounter for screening for other suspected endocrine disorder: Secondary | ICD-10-CM | POA: Diagnosis not present

## 2022-02-17 DIAGNOSIS — E559 Vitamin D deficiency, unspecified: Secondary | ICD-10-CM | POA: Diagnosis not present

## 2022-02-17 DIAGNOSIS — E876 Hypokalemia: Secondary | ICD-10-CM | POA: Diagnosis not present

## 2022-02-17 DIAGNOSIS — Z13220 Encounter for screening for lipoid disorders: Secondary | ICD-10-CM | POA: Diagnosis not present

## 2022-02-17 DIAGNOSIS — E781 Pure hyperglyceridemia: Secondary | ICD-10-CM | POA: Diagnosis not present

## 2022-02-21 DIAGNOSIS — J441 Chronic obstructive pulmonary disease with (acute) exacerbation: Secondary | ICD-10-CM | POA: Diagnosis not present

## 2022-02-21 DIAGNOSIS — J449 Chronic obstructive pulmonary disease, unspecified: Secondary | ICD-10-CM | POA: Diagnosis not present

## 2022-02-21 DIAGNOSIS — R002 Palpitations: Secondary | ICD-10-CM | POA: Diagnosis not present

## 2022-02-21 DIAGNOSIS — T148XXA Other injury of unspecified body region, initial encounter: Secondary | ICD-10-CM | POA: Diagnosis not present

## 2022-02-21 DIAGNOSIS — E781 Pure hyperglyceridemia: Secondary | ICD-10-CM | POA: Diagnosis not present

## 2022-02-21 DIAGNOSIS — R911 Solitary pulmonary nodule: Secondary | ICD-10-CM | POA: Diagnosis not present

## 2022-02-21 DIAGNOSIS — E559 Vitamin D deficiency, unspecified: Secondary | ICD-10-CM | POA: Diagnosis not present

## 2022-02-21 DIAGNOSIS — S51819A Laceration without foreign body of unspecified forearm, initial encounter: Secondary | ICD-10-CM | POA: Diagnosis not present

## 2022-02-21 DIAGNOSIS — Z1329 Encounter for screening for other suspected endocrine disorder: Secondary | ICD-10-CM | POA: Diagnosis not present

## 2022-02-21 DIAGNOSIS — R000 Tachycardia, unspecified: Secondary | ICD-10-CM | POA: Diagnosis not present

## 2022-02-21 DIAGNOSIS — Z0001 Encounter for general adult medical examination with abnormal findings: Secondary | ICD-10-CM | POA: Diagnosis not present

## 2022-02-21 DIAGNOSIS — I10 Essential (primary) hypertension: Secondary | ICD-10-CM | POA: Diagnosis not present

## 2022-02-22 DIAGNOSIS — M545 Low back pain, unspecified: Secondary | ICD-10-CM | POA: Diagnosis not present

## 2022-02-22 DIAGNOSIS — E1142 Type 2 diabetes mellitus with diabetic polyneuropathy: Secondary | ICD-10-CM | POA: Diagnosis not present

## 2022-02-22 DIAGNOSIS — Z9181 History of falling: Secondary | ICD-10-CM | POA: Diagnosis not present

## 2022-02-22 DIAGNOSIS — R030 Elevated blood-pressure reading, without diagnosis of hypertension: Secondary | ICD-10-CM | POA: Diagnosis not present

## 2022-02-22 DIAGNOSIS — Z79899 Other long term (current) drug therapy: Secondary | ICD-10-CM | POA: Diagnosis not present

## 2022-02-22 DIAGNOSIS — G894 Chronic pain syndrome: Secondary | ICD-10-CM | POA: Diagnosis not present

## 2022-02-22 DIAGNOSIS — J449 Chronic obstructive pulmonary disease, unspecified: Secondary | ICD-10-CM | POA: Diagnosis not present

## 2022-02-22 DIAGNOSIS — Z6820 Body mass index (BMI) 20.0-20.9, adult: Secondary | ICD-10-CM | POA: Diagnosis not present

## 2022-02-22 DIAGNOSIS — G8929 Other chronic pain: Secondary | ICD-10-CM | POA: Diagnosis not present

## 2022-02-22 DIAGNOSIS — M539 Dorsopathy, unspecified: Secondary | ICD-10-CM | POA: Diagnosis not present

## 2022-02-27 DIAGNOSIS — Z79899 Other long term (current) drug therapy: Secondary | ICD-10-CM | POA: Diagnosis not present

## 2022-02-28 DIAGNOSIS — R911 Solitary pulmonary nodule: Secondary | ICD-10-CM | POA: Diagnosis not present

## 2022-02-28 DIAGNOSIS — I700 Atherosclerosis of aorta: Secondary | ICD-10-CM | POA: Diagnosis not present

## 2022-02-28 DIAGNOSIS — J439 Emphysema, unspecified: Secondary | ICD-10-CM | POA: Diagnosis not present

## 2022-03-02 ENCOUNTER — Emergency Department (HOSPITAL_COMMUNITY): Payer: Medicare Other

## 2022-03-02 ENCOUNTER — Other Ambulatory Visit: Payer: Self-pay

## 2022-03-02 ENCOUNTER — Encounter (HOSPITAL_COMMUNITY): Payer: Self-pay | Admitting: Emergency Medicine

## 2022-03-02 ENCOUNTER — Inpatient Hospital Stay (HOSPITAL_COMMUNITY)
Admission: EM | Admit: 2022-03-02 | Discharge: 2022-03-05 | DRG: 205 | Disposition: A | Discharge status: Home / Self Care | Payer: Medicare Other | Source: Ambulatory Visit | Attending: Internal Medicine | Admitting: Internal Medicine

## 2022-03-02 DIAGNOSIS — Z79899 Other long term (current) drug therapy: Secondary | ICD-10-CM

## 2022-03-02 DIAGNOSIS — M47816 Spondylosis without myelopathy or radiculopathy, lumbar region: Secondary | ICD-10-CM | POA: Diagnosis not present

## 2022-03-02 DIAGNOSIS — R911 Solitary pulmonary nodule: Secondary | ICD-10-CM | POA: Diagnosis not present

## 2022-03-02 DIAGNOSIS — Z86718 Personal history of other venous thrombosis and embolism: Secondary | ICD-10-CM

## 2022-03-02 DIAGNOSIS — F17210 Nicotine dependence, cigarettes, uncomplicated: Secondary | ICD-10-CM | POA: Diagnosis not present

## 2022-03-02 DIAGNOSIS — M5136 Other intervertebral disc degeneration, lumbar region: Secondary | ICD-10-CM | POA: Diagnosis present

## 2022-03-02 DIAGNOSIS — Z8249 Family history of ischemic heart disease and other diseases of the circulatory system: Secondary | ICD-10-CM

## 2022-03-02 DIAGNOSIS — R0609 Other forms of dyspnea: Secondary | ICD-10-CM | POA: Diagnosis not present

## 2022-03-02 DIAGNOSIS — Z8672 Personal history of thrombophlebitis: Secondary | ICD-10-CM | POA: Diagnosis not present

## 2022-03-02 DIAGNOSIS — Z96641 Presence of right artificial hip joint: Secondary | ICD-10-CM | POA: Diagnosis present

## 2022-03-02 DIAGNOSIS — Z6820 Body mass index (BMI) 20.0-20.9, adult: Secondary | ICD-10-CM

## 2022-03-02 DIAGNOSIS — G894 Chronic pain syndrome: Secondary | ICD-10-CM | POA: Diagnosis not present

## 2022-03-02 DIAGNOSIS — R634 Abnormal weight loss: Secondary | ICD-10-CM | POA: Diagnosis not present

## 2022-03-02 DIAGNOSIS — I10 Essential (primary) hypertension: Secondary | ICD-10-CM | POA: Diagnosis not present

## 2022-03-02 DIAGNOSIS — Z8619 Personal history of other infectious and parasitic diseases: Secondary | ICD-10-CM

## 2022-03-02 DIAGNOSIS — Z20822 Contact with and (suspected) exposure to covid-19: Secondary | ICD-10-CM | POA: Diagnosis present

## 2022-03-02 DIAGNOSIS — D7589 Other specified diseases of blood and blood-forming organs: Secondary | ICD-10-CM | POA: Diagnosis not present

## 2022-03-02 DIAGNOSIS — Z79891 Long term (current) use of opiate analgesic: Secondary | ICD-10-CM

## 2022-03-02 DIAGNOSIS — I76 Septic arterial embolism: Secondary | ICD-10-CM

## 2022-03-02 DIAGNOSIS — B440 Invasive pulmonary aspergillosis: Secondary | ICD-10-CM

## 2022-03-02 DIAGNOSIS — J439 Emphysema, unspecified: Secondary | ICD-10-CM | POA: Diagnosis not present

## 2022-03-02 DIAGNOSIS — R0602 Shortness of breath: Secondary | ICD-10-CM | POA: Diagnosis not present

## 2022-03-02 DIAGNOSIS — J449 Chronic obstructive pulmonary disease, unspecified: Secondary | ICD-10-CM | POA: Diagnosis not present

## 2022-03-02 DIAGNOSIS — J984 Other disorders of lung: Principal | ICD-10-CM

## 2022-03-02 DIAGNOSIS — E876 Hypokalemia: Secondary | ICD-10-CM | POA: Diagnosis not present

## 2022-03-02 DIAGNOSIS — I2690 Septic pulmonary embolism without acute cor pulmonale: Secondary | ICD-10-CM | POA: Diagnosis present

## 2022-03-02 DIAGNOSIS — R918 Other nonspecific abnormal finding of lung field: Secondary | ICD-10-CM | POA: Diagnosis not present

## 2022-03-02 DIAGNOSIS — Z7951 Long term (current) use of inhaled steroids: Secondary | ICD-10-CM | POA: Diagnosis not present

## 2022-03-02 LAB — CBC WITH DIFFERENTIAL/PLATELET
Abs Immature Granulocytes: 0.03 10*3/uL (ref 0.00–0.07)
Basophils Absolute: 0.1 10*3/uL (ref 0.0–0.1)
Basophils Relative: 1 %
Eosinophils Absolute: 0.5 10*3/uL (ref 0.0–0.5)
Eosinophils Relative: 6 %
HCT: 41.2 % (ref 36.0–46.0)
Hemoglobin: 12.9 g/dL (ref 12.0–15.0)
Immature Granulocytes: 0 %
Lymphocytes Relative: 16 %
Lymphs Abs: 1.6 10*3/uL (ref 0.7–4.0)
MCH: 32.8 pg (ref 26.0–34.0)
MCHC: 31.3 g/dL (ref 30.0–36.0)
MCV: 104.8 fL — ABNORMAL HIGH (ref 80.0–100.0)
Monocytes Absolute: 0.8 10*3/uL (ref 0.1–1.0)
Monocytes Relative: 8 %
Neutro Abs: 6.7 10*3/uL (ref 1.7–7.7)
Neutrophils Relative %: 69 %
Platelets: 353 10*3/uL (ref 150–400)
RBC: 3.93 MIL/uL (ref 3.87–5.11)
RDW: 16.2 % — ABNORMAL HIGH (ref 11.5–15.5)
WBC: 9.7 10*3/uL (ref 4.0–10.5)
nRBC: 0 % (ref 0.0–0.2)

## 2022-03-02 LAB — COMPREHENSIVE METABOLIC PANEL
ALT: 9 U/L (ref 0–44)
AST: 14 U/L — ABNORMAL LOW (ref 15–41)
Albumin: 2.9 g/dL — ABNORMAL LOW (ref 3.5–5.0)
Alkaline Phosphatase: 91 U/L (ref 38–126)
Anion gap: 9 (ref 5–15)
BUN: 12 mg/dL (ref 8–23)
CO2: 24 mmol/L (ref 22–32)
Calcium: 9 mg/dL (ref 8.9–10.3)
Chloride: 106 mmol/L (ref 98–111)
Creatinine, Ser: 0.73 mg/dL (ref 0.44–1.00)
GFR, Estimated: >60 mL/min (ref >60)
Glucose, Bld: 143 mg/dL — ABNORMAL HIGH (ref 70–99)
Potassium: 3.3 mmol/L — ABNORMAL LOW (ref 3.5–5.1)
Sodium: 139 mmol/L (ref 135–145)
Total Bilirubin: 0.2 mg/dL — ABNORMAL LOW (ref 0.3–1.2)
Total Protein: 6.5 g/dL (ref 6.5–8.1)

## 2022-03-02 LAB — I-STAT CHEM 8, ED
BUN: 13 mg/dL (ref 8–23)
Calcium, Ion: 1.11 mmol/L — ABNORMAL LOW (ref 1.15–1.40)
Chloride: 103 mmol/L (ref 98–111)
Creatinine, Ser: 0.5 mg/dL (ref 0.44–1.00)
Glucose, Bld: 141 mg/dL — ABNORMAL HIGH (ref 70–99)
HCT: 42 % (ref 36.0–46.0)
Hemoglobin: 14.3 g/dL (ref 12.0–15.0)
Potassium: 3.2 mmol/L — ABNORMAL LOW (ref 3.5–5.1)
Sodium: 140 mmol/L (ref 135–145)
TCO2: 23 mmol/L (ref 22–32)

## 2022-03-02 LAB — LACTIC ACID, PLASMA: Lactic Acid, Venous: 1.7 mmol/L (ref 0.5–1.9)

## 2022-03-02 MED ORDER — IOHEXOL 350 MG/ML SOLN
51.0000 mL | Freq: Once | INTRAVENOUS | Status: AC | PRN
  Administered 2022-03-02: 51 mL via INTRAVENOUS

## 2022-03-02 NOTE — ED Notes (Signed)
Patient requesting food. Informed patient of ct that needed to be done. Took patient to triage for IV placement.

## 2022-03-02 NOTE — ED Notes (Signed)
CT informed patient now has IV access

## 2022-03-02 NOTE — ED Provider Triage Note (Signed)
Emergency Medicine Provider Triage Evaluation Note  Teresa Edwards , a 71 y.o. female  was evaluated in triage.  Pt complains of abnormal CT reading.  Patient reports that she was getting a CT for surveillance of her lung nodules She saw "possible sepsis" on her EKG and was sent over here by her primary care office.  She reports that her primary care are try to get her into see the pulmonologist although again, they did not think this could wait.  Sooner to the ER.  Patient reports that she has had some worsening shortness of breath on exertion for the past 6 months but nothing acutely.  Denies any productive cough or any fevers..  Review of Systems  Positive:  Negative:   Physical Exam  BP 125/78 (BP Location: Right Arm)   Pulse 76   Temp 98.4 F (36.9 C) (Oral)   Resp 16   SpO2 95%  Gen:   Awake, no distress   Resp:  Normal effort  MSK:   Moves extremities without difficulty  Other:  Patient speaking in full sentences with ease.  Lungs are clear.  Medical Decision Making  Medically screening exam initiated at 5:54 PM.  Appropriate orders placed.  Teresa Edwards was informed that the remainder of the evaluation will be completed by another provider, this initial triage assessment does not replace that evaluation, and the importance of remaining in the ED until their evaluation is complete.  Discussed this case with my attending.  Will order CT a of chest when labs clear.   Achille Rich, PA-C 03/02/22 1755

## 2022-03-02 NOTE — ED Triage Notes (Signed)
Patient referred by PCP for follow up on a CT scan. Per patient doctor stated this could either be sepsis, PNA or a tumor. Patient had seen her doctor for a check up and they discovered she had a fast heart rate, and she had been having fevers and chills, and feeling short of breath with exertion.

## 2022-03-03 ENCOUNTER — Observation Stay (HOSPITAL_BASED_OUTPATIENT_CLINIC_OR_DEPARTMENT_OTHER): Payer: Medicare Other

## 2022-03-03 DIAGNOSIS — Z79899 Other long term (current) drug therapy: Secondary | ICD-10-CM | POA: Diagnosis not present

## 2022-03-03 DIAGNOSIS — Z96641 Presence of right artificial hip joint: Secondary | ICD-10-CM | POA: Diagnosis present

## 2022-03-03 DIAGNOSIS — E876 Hypokalemia: Secondary | ICD-10-CM | POA: Diagnosis present

## 2022-03-03 DIAGNOSIS — G894 Chronic pain syndrome: Secondary | ICD-10-CM | POA: Diagnosis not present

## 2022-03-03 DIAGNOSIS — R0602 Shortness of breath: Secondary | ICD-10-CM | POA: Diagnosis not present

## 2022-03-03 DIAGNOSIS — R0609 Other forms of dyspnea: Secondary | ICD-10-CM | POA: Diagnosis present

## 2022-03-03 DIAGNOSIS — F17210 Nicotine dependence, cigarettes, uncomplicated: Secondary | ICD-10-CM

## 2022-03-03 DIAGNOSIS — M5136 Other intervertebral disc degeneration, lumbar region: Secondary | ICD-10-CM

## 2022-03-03 DIAGNOSIS — Z8249 Family history of ischemic heart disease and other diseases of the circulatory system: Secondary | ICD-10-CM | POA: Diagnosis not present

## 2022-03-03 DIAGNOSIS — R918 Other nonspecific abnormal finding of lung field: Secondary | ICD-10-CM | POA: Diagnosis present

## 2022-03-03 DIAGNOSIS — I10 Essential (primary) hypertension: Secondary | ICD-10-CM

## 2022-03-03 DIAGNOSIS — I2690 Septic pulmonary embolism without acute cor pulmonale: Secondary | ICD-10-CM | POA: Diagnosis present

## 2022-03-03 DIAGNOSIS — J449 Chronic obstructive pulmonary disease, unspecified: Secondary | ICD-10-CM | POA: Diagnosis not present

## 2022-03-03 DIAGNOSIS — J984 Other disorders of lung: Secondary | ICD-10-CM

## 2022-03-03 DIAGNOSIS — Z20822 Contact with and (suspected) exposure to covid-19: Secondary | ICD-10-CM | POA: Diagnosis present

## 2022-03-03 DIAGNOSIS — Z86718 Personal history of other venous thrombosis and embolism: Secondary | ICD-10-CM

## 2022-03-03 DIAGNOSIS — Z8619 Personal history of other infectious and parasitic diseases: Secondary | ICD-10-CM | POA: Diagnosis not present

## 2022-03-03 DIAGNOSIS — Z8672 Personal history of thrombophlebitis: Secondary | ICD-10-CM | POA: Diagnosis not present

## 2022-03-03 DIAGNOSIS — Z79891 Long term (current) use of opiate analgesic: Secondary | ICD-10-CM | POA: Diagnosis not present

## 2022-03-03 DIAGNOSIS — D7589 Other specified diseases of blood and blood-forming organs: Secondary | ICD-10-CM | POA: Diagnosis present

## 2022-03-03 DIAGNOSIS — R634 Abnormal weight loss: Secondary | ICD-10-CM | POA: Diagnosis present

## 2022-03-03 DIAGNOSIS — Z7951 Long term (current) use of inhaled steroids: Secondary | ICD-10-CM | POA: Diagnosis not present

## 2022-03-03 DIAGNOSIS — Z6820 Body mass index (BMI) 20.0-20.9, adult: Secondary | ICD-10-CM | POA: Diagnosis not present

## 2022-03-03 DIAGNOSIS — M47816 Spondylosis without myelopathy or radiculopathy, lumbar region: Secondary | ICD-10-CM | POA: Diagnosis present

## 2022-03-03 LAB — CBG MONITORING, ED: Glucose-Capillary: 100 mg/dL — ABNORMAL HIGH (ref 70–99)

## 2022-03-03 LAB — ECHOCARDIOGRAM COMPLETE
Area-P 1/2: 5.13 cm2
Calc EF: 68.7 %
S' Lateral: 2.2 cm
Single Plane A2C EF: 66.7 %
Single Plane A4C EF: 71 %

## 2022-03-03 LAB — SARS CORONAVIRUS 2 BY RT PCR: SARS Coronavirus 2 by RT PCR: NEGATIVE

## 2022-03-03 MED ORDER — VANCOMYCIN HCL IN DEXTROSE 1-5 GM/200ML-% IV SOLN
1000.0000 mg | INTRAVENOUS | Status: DC
  Administered 2022-03-03 – 2022-03-04 (×2): 1000 mg via INTRAVENOUS
  Filled 2022-03-03 (×3): qty 200

## 2022-03-03 MED ORDER — SODIUM CHLORIDE 0.9 % IV SOLN
1.0000 g | Freq: Once | INTRAVENOUS | Status: AC
  Administered 2022-03-03: 1 g via INTRAVENOUS
  Filled 2022-03-03: qty 10

## 2022-03-03 MED ORDER — SODIUM CHLORIDE 0.9 % IV SOLN
3.0000 g | Freq: Four times a day (QID) | INTRAVENOUS | Status: DC
Start: 2022-03-03 — End: 2022-03-05
  Administered 2022-03-03 – 2022-03-05 (×9): 3 g via INTRAVENOUS
  Filled 2022-03-03 (×10): qty 8

## 2022-03-03 MED ORDER — POTASSIUM CHLORIDE CRYS ER 20 MEQ PO TBCR
40.0000 meq | EXTENDED_RELEASE_TABLET | Freq: Two times a day (BID) | ORAL | Status: AC
Start: 2022-03-03 — End: 2022-03-03
  Administered 2022-03-03 (×2): 40 meq via ORAL
  Filled 2022-03-03 (×2): qty 2

## 2022-03-03 MED ORDER — FLUTICASONE FUROATE-VILANTEROL 100-25 MCG/ACT IN AEPB
1.0000 | INHALATION_SPRAY | Freq: Every day | RESPIRATORY_TRACT | Status: DC
  Administered 2022-03-04 – 2022-03-05 (×2): 1 via RESPIRATORY_TRACT
  Filled 2022-03-03: qty 28

## 2022-03-03 MED ORDER — UMECLIDINIUM BROMIDE 62.5 MCG/ACT IN AEPB
1.0000 | INHALATION_SPRAY | Freq: Every day | RESPIRATORY_TRACT | Status: DC
  Administered 2022-03-04 – 2022-03-05 (×2): 1 via RESPIRATORY_TRACT
  Filled 2022-03-03: qty 7

## 2022-03-03 MED ORDER — IPRATROPIUM-ALBUTEROL 0.5-2.5 (3) MG/3ML IN SOLN
3.0000 mL | RESPIRATORY_TRACT | Status: DC | PRN
  Administered 2022-03-05: 3 mL via RESPIRATORY_TRACT
  Filled 2022-03-03: qty 3

## 2022-03-03 MED ORDER — OXYCODONE HCL 5 MG PO TABS
15.0000 mg | ORAL_TABLET | Freq: Three times a day (TID) | ORAL | Status: DC | PRN
  Administered 2022-03-03 – 2022-03-05 (×5): 15 mg via ORAL
  Filled 2022-03-03 (×5): qty 3

## 2022-03-03 MED ORDER — IPRATROPIUM-ALBUTEROL 0.5-2.5 (3) MG/3ML IN SOLN
3.0000 mL | Freq: Once | RESPIRATORY_TRACT | Status: AC
  Administered 2022-03-03: 3 mL via RESPIRATORY_TRACT
  Filled 2022-03-03: qty 3

## 2022-03-03 MED ORDER — VANCOMYCIN HCL IN DEXTROSE 1-5 GM/200ML-% IV SOLN: 1000.0000 mg | INTRAVENOUS | Status: DC

## 2022-03-03 MED ORDER — LOSARTAN POTASSIUM 25 MG PO TABS
25.0000 mg | ORAL_TABLET | Freq: Every day | ORAL | Status: DC
  Administered 2022-03-03 – 2022-03-05 (×3): 25 mg via ORAL
  Filled 2022-03-03 (×3): qty 1

## 2022-03-03 MED ORDER — SODIUM CHLORIDE 0.9 % IV SOLN
500.0000 mg | Freq: Once | INTRAVENOUS | Status: AC
  Administered 2022-03-03: 500 mg via INTRAVENOUS
  Filled 2022-03-03: qty 5

## 2022-03-03 MED ORDER — DILTIAZEM HCL ER COATED BEADS 180 MG PO CP24: 180.0000 mg | ORAL_CAPSULE | Freq: Every day | ORAL | Status: DC

## 2022-03-03 MED ORDER — DILTIAZEM HCL ER COATED BEADS 180 MG PO CP24
180.0000 mg | ORAL_CAPSULE | Freq: Every day | ORAL | Status: DC
  Administered 2022-03-03 – 2022-03-05 (×3): 180 mg via ORAL
  Filled 2022-03-03 (×4): qty 1

## 2022-03-03 MED ORDER — SODIUM CHLORIDE 0.9 % IV SOLN: 3.0000 g | Freq: Four times a day (QID) | INTRAVENOUS | Status: DC

## 2022-03-03 MED ORDER — ENOXAPARIN SODIUM 40 MG/0.4ML IJ SOSY
40.0000 mg | PREFILLED_SYRINGE | INTRAMUSCULAR | Status: DC
  Administered 2022-03-03 – 2022-03-05 (×3): 40 mg via SUBCUTANEOUS
  Filled 2022-03-03 (×3): qty 0.4

## 2022-03-03 NOTE — Progress Notes (Signed)
Pharmacy Antibiotic Note  Teresa Edwards is a 71 y.o. female admitted on 03/02/2022 with  possible septic emboli on CT . Recent dental procedure reportedly in July '23. Pharmacy has been consulted for vancomycin/Unasyn dosing. S/p 1x doses of ceftriaxone/azithromycin in the ER. SCr 0.5 stable. Patient states weight is 119 lbs.  Plan: Unasyn 3g IV q6h Vancomycin 1g IV x 1; then 1mg  IV q24h. Goal AUC 400-550. Expected AUC: 514 SCr used: 0.5 Monitor clinical progress, c/s, renal function F/u de-escalation plan/LOT, vancomycin levels as indicated      Temp (24hrs), Avg:98.2 F (36.8 C), Min:97.9 F (36.6 C), Max:98.5 F (36.9 C)  Recent Labs  Lab 03/02/22 1800 03/02/22 1808  WBC 9.7  --   CREATININE 0.73 0.50  LATICACIDVEN 1.7  --     CrCl cannot be calculated (Unknown ideal weight.).    No Known Allergies  Antimicrobials this admission: 8/18 vancomycin >>  8/18 Unasyn >>  Dose adjustments this admission:   Microbiology results:   9/18, PharmD, BCPS Please check AMION for all [facility] contact numbers Clinical Pharmacist 03/03/2022 8:04 AM

## 2022-03-03 NOTE — ED Notes (Signed)
Pt alert, NAD, calm, interactive, resps e/u, speaking in clear complete sentences. Denies sob, or need for neb. C/o back pain.

## 2022-03-03 NOTE — Consult Note (Signed)
NAME:  Teresa Edwards, MRN:  [id number], DOB:  02/13/1951, LOS: 0 ADMISSION DATE:  03/02/2022 CONSULTATION DATE:  03/02/2022 REFERRING MD:  Cyndie Chime - IMTS CHIEF COMPLAINT:  SOB/DOE, pulmonary nodules   History of Present Illness:  Teresa Edwards is seen in consultation at the request of IMTS for further evaluation and management of SOB/DOE in the setting of multiple pulmonary nodules.  71 year old woman who presented to [facility] ED 8/17 from her PCP's office for SOB/DOE. PMHx significant for COPD (no home O2 requirement), multiple pulmonary nodules (initially noted 03/2021), tobacco abuse, femoral DVT with thrombophlebitis, DDD, DJD. Also reports extensive dental extractions over the 2 months prior to admission (last 01/2022).  Patient reports initial fatigue noted around 2 months PTA with worsening SOB/DOE noted about 2 weeks PTA. Denies significant cough, nothing productive/no hemoptysis. Denies fever/chills or significant weight loss. She normally can ambulate independently around her home/complete ADLs but fatigues quickly even ambulating to the bathroom. Uses a cane. She reports getting SOB/tachycardic with HR up to 120s while ambulating. She does not require home O2. Utilizes Trelegy daily and albuterol PRN for rescue, approximately 2-3 times/week. She currently smokes but has cut down significantly from 1PPD to 2-3 cigarettes/day. Denies recent travel, household pets. 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 Recently bumped her anterior RLE while getting into a F-350 and developed a hematoma at that site (resolving).  On ED presentation, patient c/o DOE/SOB and concern for abnormal CT Chest. Initial concern for PE and CTA Chest completed demonstrating multiple large cavitary nodules throughout the R lung and irregular density in the LL (septic emboli vs. metastatic disease); no PE noted. TEE completed and no e/o ABE/valvular vegetations. Blood Cx pending. Resp Cx pending. COVID  negative. Labs grossly unremarkable with the exception of hypokalemia. Admitted to IMTS service.  PCCM consulted for further evaluation/recommendations.  Pertinent Medical History:   Past Medical History:  Diagnosis Date   COPD (chronic obstructive pulmonary disease) (HCC)    DDD (degenerative disc disease)    DJD (degenerative joint disease)    DVT femoral (deep venous thrombosis) with thrombophlebitis (HCC)    Significant Hospital Events: Including procedures, antibiotic start and stop dates in addition to other pertinent events   8/17 - Admitted to IMTS for SOB/DOE, pulmonary nodules with c/f septic emboli. 8/18 - PCCM consulted for further evaluation/management of pulmonary nodules.  Interim History / Subjective:  Feeling overall ok Wants to get out of the ED and into a room Breathing feels ok, only SOB with exertion  Objective:  Blood pressure (!) 156/77, pulse 75, temperature (!) 97.2 F (36.2 C), temperature source Tympanic, resp. rate 18, height 5\' 4"  (1.626 m), weight 54 kg, SpO2 96 %.        Intake/Output Summary (Last 24 hours) at 03/03/2022 1439 Last data filed at 03/03/2022 1303 Gross per 24 hour  Intake 102.22 ml  Output --  Net 102.22 ml   Filed Weights   03/03/22 0800  Weight: 54 kg   Physical Examination: General: Chronically ill-appearing elderly woman in NAD. HEENT: [place]/AT, anicteric sclera, PERRL, dry mucous membranes. Neuro: Awake, oriented x 4. Responds to verbal stimuli. Following commands consistently. Moves all 4 extremities spontaneously. CV: RRR, no m/g/r. PULM: Breathing even and unlabored on RA. Lung fields with faint expiratory wheeze. GI: Soft, nontender, nondistended. Normoactive bowel sounds. Extremities: No significant LE edema noted. Skin: Warm/dry, no rashes. Anterior RLE with healing hematoma with swelling and scab vs. eschar.  [facility]  Problem List:    Assessment & Plan:  Teresa Edwards is seen in consultation at the  request of IMTS for further evaluation and management of SOB/DOE in the setting of multiple pulmonary nodules.  71 year old woman who presented to [facility] ED 8/17 from her PCP's office for SOB/DOE. PMHx significant for COPD (no home O2 requirement), multiple pulmonary nodules (initially noted 03/2021), tobacco abuse, femoral DVT with thrombophlebitis, DDD, DJD. Also reports extensive dental extractions over the 2 months prior to admission (last 01/2022).  Multiple pulmonary nodules Initially seen on CT 08/2020. Seen by Dr. Thora Lance [facility]) 03/2021 and at that time noted to have dominant 1.6cm RML nodule, decreased from 2.7mm on previous scan) .with recommendation for repeat scan in 6 months. Repeat CT at [facility]) with persistent/?enlarging pulmonary nodules; concern from PCP in the setting of worsening symptoms. Differential includes septic emboli, atypical infection/NTM, malignancy. - Obtain MRSA swab - Continue Vanc for now - F/u Bcx, Resp Cx - Consider AFB - If Cx remain positive or ongoing concern for bacteremia, low threshold for TEE to definitively r/o vegetation/ABE - If Cx negative, deescalate antibiotics from Vanc to Augmentin x 2-4 week course - May benefit from NAV bronch to further characterize nodules, would recommend as outpatient with Dr. Thora Lance  COPD Tobacco abuse - Supplemental O2 support as needed - Goal O2 sat 88-92% - Bronchodilators (Trelegy Ellipta, Albuterol PRN) - Pulmonary hygiene - Intermittent CXR - Encourage smoking cessation; she has cut back significantly  Best Practice: (right click and "Reselect all SmartList Selections" daily)   Per Primary Team  Labs:  CBC: Recent Labs  Lab 03/02/22 1800 03/02/22 1808  WBC 9.7  --   NEUTROABS 6.7  --   HGB 12.9 14.3  HCT 41.2 42.0  MCV 104.8*  --   PLT 353  --    Basic Metabolic Panel: Recent Labs  Lab 03/02/22 1800 03/02/22 1808  NA 139 140  K 3.3* 3.2*  CL 106 103  CO2 24  --    GLUCOSE 143* 141*  BUN 12 13  CREATININE 0.73 0.50  CALCIUM 9.0  --    GFR: Estimated Creatinine Clearance: 55 mL/min (by C-G formula based on SCr of 0.5 mg/dL). Recent Labs  Lab 03/02/22 1800  WBC 9.7  LATICACIDVEN 1.7   Liver Function Tests: Recent Labs  Lab 03/02/22 1800  AST 14*  ALT 9  ALKPHOS 91  BILITOT 0.2*  PROT 6.5  ALBUMIN 2.9*   No results for input(s): "LIPASE", "AMYLASE" in the last 168 hours. No results for input(s): "AMMONIA" in the last 168 hours.  ABG:    Component Value Date/Time   TCO2 23 03/02/2022 1808   Coagulation Profile: No results for input(s): "INR", "PROTIME" in the last 168 hours.  Cardiac Enzymes: No results for input(s): "CKTOTAL", "CKMB", "CKMBINDEX", "TROPONINI" in the last 168 hours.  HbA1C: No results found for: "HGBA1C"  CBG: Recent Labs  Lab 03/03/22 0903  GLUCAP 100*   Review of Systems:   Review of systems completed with pertinent positives/negatives outlined in above HPI.  Past Medical History:  She,  has a past medical history of COPD (chronic obstructive pulmonary disease) (HCC), DDD (degenerative disc disease), DJD (degenerative joint disease), and DVT femoral (deep venous thrombosis) with thrombophlebitis (HCC).   Surgical History:   Past Surgical History:  Procedure Laterality Date   TOTAL HIP ARTHROPLASTY Right    TUBAL LIGATION  10/1971   Social History:   reports that she has been  smoking cigarettes. She has a 20.00 pack-year smoking history. She has never used smokeless tobacco. She reports that she does not drink alcohol and does not use drugs.   Family History:  Her family history includes Cancer in her brother; Deep vein thrombosis in her mother; Hypertension in her sister; Other in her mother and sister.   Allergies: No Known Allergies   Home Medications: Prior to Admission medications   Medication Sig Start Date End Date Taking? Authorizing Provider  albuterol (PROVENTIL HFA;VENTOLIN HFA)  108 (90 Base) MCG/ACT inhaler Inhale 2 puffs into the lungs every 6 (six) hours as needed for wheezing or shortness of breath. 11/03/16  Yes Erick Blinks, MD  albuterol (PROVENTIL) (2.5 MG/3ML) 0.083% nebulizer solution Take 3 mLs (2.5 mg total) by nebulization every 6 (six) hours as needed for wheezing or shortness of breath. 11/03/16  Yes Erick Blinks, MD  diltiazem (CARDIZEM CD) 180 MG 24 hr capsule Take 180 mg by mouth daily.   Yes [provider]  losartan (COZAAR) 25 MG tablet Take 25 mg by mouth daily. 02/24/22  Yes [provider]  oxycodone (ROXICODONE) 30 MG immediate release tablet Take 15-30 mg by mouth every 4 (four) hours as needed for pain.    Yes [provider]  TRELEGY ELLIPTA 100-62.5-25 MCG/INH AEPB Inhale 1 puff into the lungs daily. 03/01/21  Yes [provider]  doxycycline (VIBRA-TABS) 100 MG tablet Take 100 mg by mouth 2 (two) times daily. 03/02/22   [provider]    Signature:   Tim Lair, PA-C [facility] 03/03/22 2:39 PM  Please see [email or web address] for pager details.  From 7A-7P if no response, please call [phone] After hours, please call ELink [phone]

## 2022-03-03 NOTE — ED Notes (Signed)
Attempted to give report to yellow RN

## 2022-03-03 NOTE — ED Provider Notes (Signed)
[facility] EMERGENCY DEPARTMENT Provider Note   CSN: [id number] Arrival date & time: 03/02/22  1644     History  Chief Complaint  Patient presents with   Shortness of Breath    Teresa Edwards is a 71 y.o. female.  The history is provided by the patient.  Patient presents with shortness of breath and abnormal CT chest as an outpatient. She reports over the past several weeks she has had dyspnea on exertion and palpitations with walking.  No chest pain.  No syncope is reported.  She reports some chills at night but no fevers recorded.  No vomiting or diarrhea.  She has a history of COPD but is not on home oxygen.  She continues to smoke.  She denies any IV drug use.  No recent travel.  She reports recent extensive dental extraction over the past 2 months.  Last dental surgery was back in July.  She does report she had a recent dental infection but does not recall receiving any antibiotics Patient was sent in by her primary doctor for further evaluation of an abnormal CAT scan of her chest    Home Medications Prior to Admission medications   Medication Sig Start Date End Date Taking? Authorizing Provider  albuterol (PROVENTIL HFA;VENTOLIN HFA) 108 (90 Base) MCG/ACT inhaler Inhale 2 puffs into the lungs every 6 (six) hours as needed for wheezing or shortness of breath. 11/03/16   Erick Blinks, MD  albuterol (PROVENTIL) (2.5 MG/3ML) 0.083% nebulizer solution Take 3 mLs (2.5 mg total) by nebulization every 6 (six) hours as needed for wheezing or shortness of breath. 11/03/16   Erick Blinks, MD  ALPRAZolam Prudy Feeler) 0.5 MG tablet Take 0.5 mg by mouth 3 (three) times daily as needed for anxiety.     [provider]  amoxicillin-clavulanate (AUGMENTIN) 875-125 MG tablet Take 1 tablet by mouth 2 (two) times daily. 04/01/21   [provider]  benzonatate (TESSALON) 100 MG capsule Take by mouth 3 (three) times daily as needed for cough.    Donetta Potts, MD  diltiazem (CARDIZEM CD) 120 MG 24 hr capsule Take 120 mg by mouth daily. 04/01/21   [provider]  doxycycline (VIBRA-TABS) 100 MG tablet Take 100 mg by mouth 2 (two) times daily. 03/02/22   [provider]  fluticasone (FLONASE) 50 MCG/ACT nasal spray Place into both nostrils daily.    Donetta Potts, MD  losartan (COZAAR) 25 MG tablet Take 25 mg by mouth daily. 02/24/22   [provider]  oxycodone (ROXICODONE) 30 MG immediate release tablet Take 15-30 mg by mouth every 4 (four) hours as needed for pain.     [provider]  predniSONE (DELTASONE) 10 MG tablet Take 10 mg by mouth daily with breakfast.    Donetta Potts, MD  TRELEGY ELLIPTA 100-62.5-25 MCG/INH AEPB Inhale 1 puff into the lungs daily. 03/01/21   [provider]      Allergies    Patient has no known allergies.    Review of Systems   Review of Systems  Constitutional:  Positive for chills.  Respiratory:  Positive for shortness of breath.   Cardiovascular:  Positive for palpitations.  Gastrointestinal:  Negative for diarrhea and vomiting.    Physical Exam Updated Vital Signs BP 133/72   Pulse 77   Temp 98.2 F (36.8 C) (Oral)   Resp 16   SpO2 97%  Physical Exam CONSTITUTIONAL: Elderly, no acute distress HEAD: Normocephalic/atraumatic EYES: EOMI/PERRL ENMT:  Mucous membranes moist, poor dentition.  No obvious dental abscess NECK: supple no meningeal signs SPINE/BACK:entire spine nontender CV: S1/S2 noted, there were no loud or harsh murmurs LUNGS: Wheezing bilaterally ABDOMEN: soft, nontender, no rebound or guarding, bowel sounds noted throughout abdomen GU:no cva tenderness NEURO: Pt is awake/alert/appropriate, moves all extremitiesx4.  No facial droop.   EXTREMITIES: pulses normal/equal, full ROM, no significant lower extremity edema SKIN: warm, color normal PSYCH: no abnormalities of mood noted, alert and oriented to situation  ED Results /  Procedures / Treatments   Labs (all labs ordered are listed, but only abnormal results are displayed) Labs Reviewed  CBC WITH DIFFERENTIAL/PLATELET - Abnormal; Notable for the following components:      Result Value   MCV 104.8 (*)    RDW 16.2 (*)    All other components within normal limits  COMPREHENSIVE METABOLIC PANEL - Abnormal; Notable for the following components:   Potassium 3.3 (*)    Glucose, Bld 143 (*)    Albumin 2.9 (*)    AST 14 (*)    Total Bilirubin 0.2 (*)    All other components within normal limits  I-STAT CHEM 8, ED - Abnormal; Notable for the following components:   Potassium 3.2 (*)    Glucose, Bld 141 (*)    Calcium, Ion 1.11 (*)    All other components within normal limits  CULTURE, BLOOD (ROUTINE X 2)  CULTURE, BLOOD (ROUTINE X 2)  SARS CORONAVIRUS 2 BY RT PCR  LACTIC ACID, PLASMA    EKG EKG Interpretation  Date/Time:  Thursday March 02 2022 17:18:54 EDT Ventricular Rate:  85 PR Interval:  150 QRS Duration: 72 QT Interval:  368 QTC Calculation: 437 R Axis:   55 Text Interpretation: Sinus rhythm with Premature atrial complexes Otherwise normal ECG Confirmed by Zadie Rhine ([id number]) on 03/03/2022 5:09:32 AM  Radiology CT Angio Chest PE W and/or Wo Contrast  Result Date: 03/02/2022 CLINICAL DATA:  Cavitary nodules, septic emboli, metastatic disease on CT chest 02/28/2022 EXAM: CT ANGIOGRAPHY CHEST WITH CONTRAST TECHNIQUE: Multidetector CT imaging of the chest was performed using the standard protocol during bolus administration of intravenous contrast. Multiplanar CT image reconstructions and MIPs were obtained to evaluate the vascular anatomy. RADIATION DOSE REDUCTION: This exam was performed according to the departmental dose-optimization program which includes automated exposure control, adjustment of the mA and/or kV according to patient size and/or use of iterative reconstruction technique. CONTRAST:  69mL OMNIPAQUE IOHEXOL 350 MG/ML SOLN  COMPARISON:  CT chest 02/28/2022 FINDINGS: Cardiovascular: Satisfactory opacification of the pulmonary arteries to the segmental level. No evidence of pulmonary embolism. Normal heart size. No pericardial effusion. Aortic and coronary artery atherosclerotic calcification. Mediastinum/Nodes: No enlarged mediastinal, hilar, or axillary lymph nodes. Thyroid gland, trachea, and esophagus demonstrate no significant findings. Lungs/Pleura: Advanced bilateral emphysema. Biapical scarring. No pneumothorax or pleural effusion. Multiple solid pulmonary nodules are redemonstrated some of which are cavitary (on series 6): Right upper lobe nodule measuring 1.8 cm (image 29); Right upper lobe nodule measuring 0.9 cm (image 39); Cavitary right middle lobe nodule measuring 2.0 cm (image 69); Cavitary lesion along the right major fissure measuring 4.0 cm (image 65); Cavitary nodule in the right lower lobe measuring 1.8 cm (image 126); Irregular area of consolidation within the superior segment left lower lobe measuring 1.3 cm (image 47). These are not significantly changed from 02/28/2022. No new nodules. Upper Abdomen: No acute abnormality. Musculoskeletal: No chest wall abnormality. No acute or significant osseous findings. Review of the  MIP images confirms the above findings. IMPRESSION: Negative for acute pulmonary embolism. Multiple unchanged large cavitary nodules throughout the right lung and irregular density in the left lower lobe. These remain favored to represent septic emboli however metastatic disease is not excluded. Short interval follow-up is recommended in 2-3 weeks. Coronary artery atherosclerotic. Aortic Atherosclerosis (ICD10-I70.0) and Emphysema (ICD10-J43.9). Electronically Signed   By: Minerva Fester M.D.   On: 03/02/2022 21:57    Procedures Procedures    Medications Ordered in ED Medications  cefTRIAXone (ROCEPHIN) 1 g in sodium chloride 0.9 % 100 mL IVPB (has no administration in time range)   azithromycin (ZITHROMAX) 500 mg in sodium chloride 0.9 % 250 mL IVPB (has no administration in time range)  iohexol (OMNIPAQUE) 350 MG/ML injection 51 mL (51 mLs Intravenous Contrast Given 03/02/22 2138)  ipratropium-albuterol (DUONEB) 0.5-2.5 (3) MG/3ML nebulizer solution 3 mL (3 mLs Nebulization Given 03/03/22 0545)    ED Course/ Medical Decision Making/ A&P Clinical Course as of 03/03/22 0733  Fri Mar 03, 2022  0611 Patient history of COPD reports increasing shortness of breath and palpitations with exertion recently.  Outpatient CT scan was concerning for septic emboli.  CT scan Emergency Department confirmed these findings.  Only risk factor at this time is recent dental extraction.  Blood cultures will be ordered and IV antibiotics ordered [DW]  0733 Discussed with internal medicine resident team for admission [DW]    Clinical Course User Index [DW] Zadie Rhine, MD                           Medical Decision Making Risk Prescription drug management. Decision regarding hospitalization.   This patient presents to the ED for concern of shortness of breath, this involves an extensive number of treatment options, and is a complaint that carries with it a high risk of complications and morbidity.  The differential diagnosis includes but is not limited to pneumonia, CHF, pulmonary embolism, acute coronary syndrome  Comorbidities that complicate the patient evaluation: Patient's presentation is complicated by their history of COPD  Social Determinants of Health: Patient's  ongoing tobacco abuse   increases the complexity of managing their presentation  Additional history obtained: Additional history obtained from family Records reviewed  outpatient CT scan results reviewed  Lab Tests: I Ordered, and personally interpreted labs.  The pertinent results include: Mild hyperglycemia  Imaging Studies ordered: I ordered imaging studies including CT scan chest   I independently  visualized and interpreted imaging which showed septic emboli I agree with the radiologist interpretation   Medicines ordered and prescription drug management: I ordered medication including IV antibiotics for septic emboli    Critical Interventions:  IV antibiotics  Consultations Obtained: I requested consultation with the admitting physician internal medicine , and discussed  findings as well as pertinent plan - they recommend: Admit  Reevaluation: After the interventions noted above, I reevaluated the patient and found that they have :improved  Complexity of problems addressed: Patient's presentation is most consistent with  acute presentation with potential threat to life or bodily function  Disposition: After consideration of the diagnostic results and the patient's response to treatment,  I feel that the patent would benefit from admission   .           Final Clinical Impression(s) / ED Diagnoses Final diagnoses:  Septic embolism (HCC)    Rx / DC Orders ED Discharge Orders     None  Zadie Rhine, MD 03/03/22 [phone]

## 2022-03-03 NOTE — Progress Notes (Signed)
  Echocardiogram 2D Echocardiogram has been performed.  Augustine Radar 03/03/2022, 8:54 AM

## 2022-03-03 NOTE — H&P (Cosign Needed Addendum)
Date: 03/03/2022               Patient Name:  Teresa Edwards MRN: [id number]  DOB: August 27, 1950 Age / Sex: 71 y.o., female   PCP: Donetta Potts, MD         Medical Service: Internal Medicine Teaching Service         Attending Physician: Dr. Mayford Knife, Dorene Ar, MD    First Contact: Dr. Thomasene Ripple Pager: [phone]  Second Contact: Dr. Marijo Conception Pager: [phone]       After Hours (After 5p/  First Contact Pager: [phone]  weekends / holidays): Second Contact Pager: [phone]   Chief Concern: Dyspnea with exertion  History of Present Illness:   Teresa Edwards is a 71 year old female with past medical history of COPD, pulmonary nodules, tobacco use disorder, DVT, hypertension, lower back DJD on chronic opioids, who was sent to the emergency room for progressive dyspnea with exertion and finding of possible septic emboli on CT scan.  Patient reports worsening dyspnea with exertion the last few weeks.  Before she can walk 1 block without issue.  Now walking from her bedroom to the bathroom is her limit.  She also endorses palpitation without chest pain, headache, abdominal pain, nausea, vomiting, urinary or bowel movement issue.  She denies cough or sputum production.  She said that her oxygen saturation was normal at home.  She reports chills at night but denies fever or night sweats or feeling sick.  She endorses weight loss the last 2 months from 130 pounds to 119 pounds.   Patient report adherence to her medications including Trelegy and albuterol.  Denies using albuterol more frequently in the last few weeks.  Patient reports recent dental procedure when they removed 7 her teeth in June and 7 more in July.  She was not on antibiotics for either procedures.  She thought that 1 tooth was infected but she did not receive antibiotic.  Currently denies any mouth pain, purulent discharge or erythema. She denies open wound except a healed wound on right shin 1 month ago. She has lower  lumbar DJD on chronic opoid and denies new or worsening pain or new neurological deficits.   She is uptodate with cancer screening. Unsure last Colonoscopy but she is not due for one. Denies melena or hematochezia. Mammogram last year was normal. She no longer needs Pap smear.   Patient was following by pulmonology for her COPD and pulmonary nodules, last visit was 03/2021.  They proposed invasive work-up to rule out cancer but patient declined.  The plan was to repeat a CT chest in 6 months which showed interval development of multiple large cavitary nodules throughout bilateral lungs consistent with multifocal pneumonia or septic emboli.  Cannot rule out metastatic disease.  Patient was sent to the emergency room by her PCP due to this finding.  In the ED, patient is normotensive, afebrile and breathing well on room air.  CTA was obtained which was negative for PE.  There are multiple unchanged large cavitary nodules suspicious for septic emboli or metastatic disease.  Patient will be admitted for  work-up of her new cavitary nodules.  Meds:  Current Meds  Medication Sig   albuterol (PROVENTIL HFA;VENTOLIN HFA) 108 (90 Base) MCG/ACT inhaler Inhale 2 puffs into the lungs every 6 (six) hours as needed for wheezing or shortness of breath.   albuterol (PROVENTIL) (2.5 MG/3ML) 0.083% nebulizer solution Take 3 mLs (2.5 mg total) by nebulization every 6 (six) hours  as needed for wheezing or shortness of breath.   diltiazem (CARDIZEM CD) 180 MG 24 hr capsule Take 180 mg by mouth daily.   losartan (COZAAR) 25 MG tablet Take 25 mg by mouth daily.   oxycodone (ROXICODONE) 30 MG immediate release tablet Take 15-30 mg by mouth every 4 (four) hours as needed for pain.    TRELEGY ELLIPTA 100-62.5-25 MCG/INH AEPB Inhale 1 puff into the lungs daily.     Allergies: Allergies as of 03/02/2022   (No Known Allergies)   Past Medical History:  Diagnosis Date   COPD (chronic obstructive pulmonary disease) (HCC)     DDD (degenerative disc disease)    DJD (degenerative joint disease)    DVT femoral (deep venous thrombosis) with thrombophlebitis (HCC)     Family History:  Family History  Problem Relation Age of Onset   Deep vein thrombosis Mother    Other Mother        varicose veins   Hypertension Sister    Other Sister        varicose veins   Cancer Brother      Social History:  Lives with her grandson Independent with ADLs Use a cane for ambulation Denies alcohol use Only smoked 2 cigarettes a day.  Used to smoke 1 pack a day No drug use or IV drug use PCP: Dr. Mayford Knife at [facility]  Review of Systems: A complete ROS was negative except as per HPI.   Physical Exam: Blood pressure 132/74, pulse 89, temperature 98.2 F (36.8 C), temperature source Oral, resp. rate 15, SpO2 95 %. Physical Exam Constitutional:      General: She is not in acute distress.    Appearance: She is not ill-appearing or toxic-appearing.  HENT:     Head: Normocephalic.     Mouth/Throat:     Mouth: Mucous membranes are moist.     Pharynx: No oropharyngeal exudate or posterior oropharyngeal erythema.     Comments: She has only 1 tooth left Eyes:     General: No scleral icterus.       Right eye: No discharge.        Left eye: No discharge.     Conjunctiva/sclera: Conjunctivae normal.  Cardiovascular:     Rate and Rhythm: Normal rate and regular rhythm.     Pulses: Normal pulses.     Heart sounds: Normal heart sounds. No murmur heard. Pulmonary:     Effort: Pulmonary effort is normal. No respiratory distress.     Breath sounds: Normal breath sounds. No wheezing.  Abdominal:     Palpations: Abdomen is soft.     Comments: Mild tenderness to palpation in the left lower quadrant  Musculoskeletal:        General: No swelling. Normal range of motion.     Right lower leg: No edema.     Left lower leg: No edema.     Comments: No midline tenderness palpated of lumbar.   Skin:    General: Skin is warm.      Coloration: Skin is not jaundiced.     Comments: Patient has a well-healed wound on the right shin that has scabbed over.  No surrounding erythema or edema. No stigmata of endocarditis Diminished pulses of the right foot.  +2 pedis pulses of the left foot.  Neurological:     Mental Status: She is alert and oriented to person, place, and time.  Psychiatric:        Mood and Affect: Mood normal.  EKG: personally reviewed my interpretation is NSR   Assessment & Plan by Problem: Principal Problem:   Cavitary lung disease Active Problems:   Cavitating mass in right lower lung lobe   History of DVT (deep vein thrombosis)   HTN (hypertension)   Chronic pain syndrome  Teresa Edwards is a 71 year old female with past medical history of COPD, pulmonary nodules, tobacco use disorder, DVT, hypertension, lower back DJD on chronic opioids, who was sent to the emergency room for progressive dyspnea with exertion and finding of possible septic emboli on CT scan, who was admitted for further work-up of septic emboli/multifocal pneumonia  Dyspnea on exertion New cavitary lung nodules These scatter cavitary nodules suspicious for septic emboli but cannot rule out metastatic disease.  These nodules had almost resolved on CT scan in 2022 but now resurfaced. These waxing and waning nodules were thought due to cavitary NTM disease, however acid-fast sputum culture was negative.  Her only risk for septic emboli/endocarditis was recent dental procedure.  She denies IV drug use.  The wound on her shin looks well-healed. No sign of spine infection. I am however unclear about the infection picture without fever or leukocytosis.  Her weight loss could be 2/2 infection but also concern for malignancy in the setting of tobacco use. -Start vancomycin and Unasyn for broad coverage including anaerobes -Obtain TTE -Pending blood culture and sputum culture -If rule out infectious causes, will consult  pulmonology for malignancy work-up.  Patient however told me that she would not want to proceed with any invasive procedure including bronchoscopy.  COPD Tobacco use disorder No evidence of exacerbation -Resume Trelegy -DuoNebs as needed -Encourage cessation  Hypertension Home regimen including diltiazem and losartan.  Unsure why patient is on diltiazem. -Resume diltiazem 100 mg for now -Hold losartan due to normotension  Hypokalemia -Repleted  Macrocytosis -Obtain B12 level  Lower lumbar degenerative disc disease on chronic opioid -Resume oxycodone 15 mg Q8H PRN  Full code Diet: Heart healthy DVT: Lovenox IVF: N/A  Dispo: Admit patient to Observation with expected length of stay less than 2 midnights.  Signed: Doran Stabler, DO 03/03/2022, 8:14 AM  Pager: [phone] After 5pm on weekdays and 1pm on weekends: On Call pager: [phone]

## 2022-03-03 NOTE — ED Notes (Signed)
ED TO INPATIENT HANDOFF REPORT  ED Nurse Name and Phone #:   S Name/Age/Gender Teresa Edwards 71 y.o. female Room/Bed: TRAAC/TRAAC  Code Status   Code Status: Full Code  Home/SNF/Other Home Patient oriented to: self, place, time, and situation Is this baseline? Yes   Triage Complete: Triage complete  Chief Complaint Cavitary lung disease [J98.4]  Triage Note Patient referred by PCP for follow up on a CT scan. Per patient doctor stated this could either be sepsis, PNA or a tumor. Patient had seen her doctor for a check up and they discovered she had a fast heart rate, and she had been having fevers and chills, and feeling short of breath with exertion.    Allergies No Known Allergies  Level of Care/Admitting Diagnosis ED Disposition     ED Disposition  Admit   Condition  --   Comment  [facility] [[id number]]  Level of Care: Telemetry Medical [104]  May [facility] to [facility] or [facility] if equivalent level of care is available:: No  Covid Evaluation: Asymptomatic - no recent exposure (last 10 days) testing not required  Diagnosis: Cavitary lung disease [098119][[id number]]  Admitting Physician: Miguel AschoffWILLIAMS, JULIE ANNE [[id number]]  Attending Physician: Miguel AschoffWILLIAMS, JULIE ANNE [[id number]]  Certification:: I certify this patient will need inpatient services for at least 2 midnights  Estimated Length of Stay: 2          B Medical/Surgery History Past Medical History:  Diagnosis Date   COPD (chronic obstructive pulmonary disease) (HCC)    DDD (degenerative disc disease)    DJD (degenerative joint disease)    DVT femoral (deep venous thrombosis) with thrombophlebitis [facility](HCC)    Past Surgical History:  Procedure Laterality Date   TOTAL HIP ARTHROPLASTY Right    TUBAL LIGATION  10/1971     A IV Location/Drains/Wounds Patient Lines/Drains/Airways Status     Active Line/Drains/Airways     Name Placement date Placement time Site Days    Peripheral IV 03/02/22 18 G Right Antecubital 03/02/22  2109  Antecubital  1            Intake/Output Last 24 hours  Intake/Output Summary (Last 24 hours) at 03/03/2022 1614 Last data filed at 03/03/2022 1554 Gross per 24 hour  Intake 302.22 ml  Output --  Net 302.22 ml    Labs/Imaging Results for orders placed or performed during the hospital encounter of 03/02/22 (from the past 48 hour(s))  Lactic acid, plasma     Status: None   Collection Time: 03/02/22  6:00 PM  Result Value Ref Range   Lactic Acid, Venous 1.7 0.5 - 1.9 mmol/L    Comment: Performed at [facility], [address] [place], [place] [address]  CBC with Differential     Status: Abnormal   Collection Time: 03/02/22  6:00 PM  Result Value Ref Range   WBC 9.7 4.0 - 10.5 K/uL   RBC 3.93 3.87 - 5.11 MIL/uL   Hemoglobin 12.9 12.0 - 15.0 g/dL   HCT 95.641.2 21.336.0 - 08.646.0 %   MCV 104.8 (H) 80.0 - 100.0 fL   MCH 32.8 26.0 - 34.0 pg   MCHC 31.3 30.0 - 36.0 g/dL   RDW 57.816.2 (H) 46.911.5 - 62.915.5 %   Platelets 353 150 - 400 K/uL   nRBC 0.0 0.0 - 0.2 %   Neutrophils Relative % 69 %   Neutro Abs 6.7 1.7 - 7.7 K/uL   Lymphocytes Relative 16 %  Lymphs Abs 1.6 0.7 - 4.0 K/uL   Monocytes Relative 8 %   Monocytes Absolute 0.8 0.1 - 1.0 K/uL   Eosinophils Relative 6 %   Eosinophils Absolute 0.5 0.0 - 0.5 K/uL   Basophils Relative 1 %   Basophils Absolute 0.1 0.0 - 0.1 K/uL   Immature Granulocytes 0 %   Abs Immature Granulocytes 0.03 0.00 - 0.07 K/uL    Comment: Performed at [facility], [address] [place], [place] [address]  Comprehensive metabolic panel     Status: Abnormal   Collection Time: 03/02/22  6:00 PM  Result Value Ref Range   Sodium 139 135 - 145 mmol/L   Potassium 3.3 (L) 3.5 - 5.1 mmol/L   Chloride 106 98 - 111 mmol/L   CO2 24 22 - 32 mmol/L   Glucose, Bld 143 (H) 70 - 99 mg/dL    Comment: Glucose reference range applies only to samples taken after fasting for at least 8 hours.   BUN 12 8 -  23 mg/dL   Creatinine, Ser 4.96 0.44 - 1.00 mg/dL   Calcium 9.0 8.9 - 75.9 mg/dL   Total Protein 6.5 6.5 - 8.1 g/dL   Albumin 2.9 (L) 3.5 - 5.0 g/dL   AST 14 (L) 15 - 41 U/L   ALT 9 0 - 44 U/L   Alkaline Phosphatase 91 38 - 126 U/L   Total Bilirubin 0.2 (L) 0.3 - 1.2 mg/dL   GFR, Estimated >16 >38 mL/min    Comment: (NOTE) Calculated using the CKD-EPI Creatinine Equation (2021)    Anion gap 9 5 - 15    Comment: Performed at [facility], [address] [place], [place] [address]  I-stat chem 8, ED (not at [facility] or [facility])     Status: Abnormal   Collection Time: 03/02/22  6:08 PM  Result Value Ref Range   Sodium 140 135 - 145 mmol/L   Potassium 3.2 (L) 3.5 - 5.1 mmol/L   Chloride 103 98 - 111 mmol/L   BUN 13 8 - 23 mg/dL   Creatinine, Ser 9.35 0.44 - 1.00 mg/dL   Glucose, Bld 701 (H) 70 - 99 mg/dL    Comment: Glucose reference range applies only to samples taken after fasting for at least 8 hours.   Calcium, Ion 1.11 (L) 1.15 - 1.40 mmol/L   TCO2 23 22 - 32 mmol/L   Hemoglobin 14.3 12.0 - 15.0 g/dL   HCT 77.9 39.0 - 30.0 %  SARS Coronavirus 2 by RT PCR (hospital order, performed in [facility] hospital lab) *cepheid single result test* Anterior Nasal Swab     Status: None   Collection Time: 03/03/22  5:34 AM   Specimen: Anterior Nasal Swab  Result Value Ref Range   SARS Coronavirus 2 by RT PCR NEGATIVE NEGATIVE    Comment: (NOTE) SARS-CoV-2 target nucleic acids are NOT DETECTED.  The SARS-CoV-2 RNA is generally detectable in upper and lower respiratory specimens during the acute phase of infection. The lowest concentration of SARS-CoV-2 viral copies this assay can detect is 250 copies / mL. A negative result does not preclude SARS-CoV-2 infection and should not be used as the sole basis for treatment or other patient management decisions.  A negative result may occur with improper specimen collection / handling, submission of specimen other than nasopharyngeal swab,  presence of viral mutation(s) within the areas targeted by this assay, and inadequate number of viral copies (<250 copies / mL). A negative  result must be combined with clinical observations, patient history, and epidemiological information.  Fact Sheet for Patients:   RoadLapTop.co.za  Fact Sheet for Healthcare Providers: http://kim-miller.com/  This test is not yet approved or  cleared by the [place] FDA and has been authorized for detection and/or diagnosis of SARS-CoV-2 by FDA under an Emergency Use Authorization (EUA).  This EUA will remain in effect (meaning this test can be used) for the duration of the COVID-19 declaration under Section 564(b)(1) of the Act, 21 U.S.C. section 360bbb-3(b)(1), unless the authorization is terminated or revoked sooner.  Performed at [facility], [address], [place], [place] [address]   Blood culture (routine x 2)     Status: None (Preliminary result)   Collection Time: 03/03/22  6:22 AM   Specimen: BLOOD  Result Value Ref Range   Specimen Description BLOOD LEFT ANTECUBITAL    Special Requests      BOTTLES DRAWN AEROBIC AND ANAEROBIC Blood Culture results may not be optimal due to an inadequate volume of blood received in culture bottles   Culture      NO GROWTH <12 HOURS Performed at [facility], [address] [place], [place] [address]    Report Status PENDING   Blood culture (routine x 2)     Status: None (Preliminary result)   Collection Time: 03/03/22  6:23 AM   Specimen: BLOOD LEFT FOREARM  Result Value Ref Range   Specimen Description BLOOD LEFT FOREARM    Special Requests      BOTTLES DRAWN AEROBIC ONLY Blood Culture adequate volume   Culture      NO GROWTH <12 HOURS Performed at [facility], [address], [place], [place] [address]    Report Status PENDING   CBG monitoring, ED     Status: Abnormal   Collection Time: 03/03/22  9:03 AM  Result  Value Ref Range   Glucose-Capillary 100 (H) 70 - 99 mg/dL    Comment: Glucose reference range applies only to samples taken after fasting for at least 8 hours.   ECHOCARDIOGRAM COMPLETE  Result Date: 03/03/2022    ECHOCARDIOGRAM REPORT   Patient Name:   Teresa Edwards Date of Exam: 03/03/2022 Medical Rec #:  [id number]          Height:       64.0 in Accession #:    [id number]         Weight:       126.0 lb Date of Birth:  1951/04/19          BSA:          1.608 m Patient Age:    71 years           BP:           132/74 mmHg Patient Gender: F                  HR:           85 bpm. Exam Location:  Inpatient Procedure: 2D Echo, Cardiac Doppler and Color Doppler Indications:    Bacteremia R78.81  History:        Patient has no prior history of Echocardiogram examinations.                 COPD.  Sonographer:    Eulah Pont RDCS Referring Phys: [id number] JULIE ANNE WILLIAMS IMPRESSIONS  1. Left ventricular ejection fraction, by estimation, is 60 to 65%. The left  ventricle has normal function. The left ventricle has no regional wall motion abnormalities. Left ventricular diastolic parameters are consistent with Grade I diastolic dysfunction (impaired relaxation).  2. Right ventricular systolic function is normal. The right ventricular size is normal. Tricuspid regurgitation signal is inadequate for assessing PA pressure.  3. The mitral valve is normal in structure. No evidence of mitral valve regurgitation.  4. The aortic valve is normal in structure. Aortic valve regurgitation is not visualized.  5. The inferior vena cava is normal in size with greater than 50% respiratory variability, suggesting right atrial pressure of 3 mmHg. Comparison(s): No prior Echocardiogram. Conclusion(s)/Recommendation(s): Normal biventricular function without evidence of hemodynamically significant valvular heart disease. FINDINGS  Left Ventricle: Left ventricular ejection fraction, by estimation, is 60 to 65%. The left ventricle has  normal function. The left ventricle has no regional wall motion abnormalities. The left ventricular internal cavity size was normal in size. There is  no left ventricular hypertrophy. Left ventricular diastolic parameters are consistent with Grade I diastolic dysfunction (impaired relaxation). Right Ventricle: The right ventricular size is normal. No increase in right ventricular wall thickness. Right ventricular systolic function is normal. Tricuspid regurgitation signal is inadequate for assessing PA pressure. Left Atrium: Left atrial size was normal in size. Right Atrium: Right atrial size was normal in size. Pericardium: Trivial pericardial effusion is present. Mitral Valve: The mitral valve is normal in structure. No evidence of mitral valve regurgitation. Tricuspid Valve: The tricuspid valve is normal in structure. Tricuspid valve regurgitation is trivial. Aortic Valve: The aortic valve is normal in structure. Aortic valve regurgitation is not visualized. Pulmonic Valve: The pulmonic valve was normal in structure. Pulmonic valve regurgitation is not visualized. Aorta: The aortic root and ascending aorta are structurally normal, with no evidence of dilitation. Venous: The inferior vena cava is normal in size with greater than 50% respiratory variability, suggesting right atrial pressure of 3 mmHg. IAS/Shunts: No atrial level shunt detected by color flow Doppler.  LEFT VENTRICLE PLAX 2D LVIDd:         3.70 cm     Diastology LVIDs:         2.20 cm     LV e' medial:    5.08 cm/s LV PW:         0.80 cm     LV E/e' medial:  13.6 LV IVS:        0.80 cm     LV e' lateral:   6.13 cm/s LVOT diam:     1.90 cm     LV E/e' lateral: 11.3 LV SV:         62 LV SV Index:   39 LVOT Area:     2.84 cm  LV Volumes (MOD) LV vol d, MOD A2C: 67.9 ml LV vol d, MOD A4C: 59.7 ml LV vol s, MOD A2C: 22.6 ml LV vol s, MOD A4C: 17.3 ml LV SV MOD A2C:     45.3 ml LV SV MOD A4C:     59.7 ml LV SV MOD BP:      44.0 ml RIGHT VENTRICLE RV S  prime:     18.50 cm/s TAPSE (M-mode): 2.0 cm LEFT ATRIUM             Index        RIGHT ATRIUM           Index LA diam:        3.60 cm 2.24 cm/m   RA Area:  11.50 cm LA Vol (A2C):   32.1 ml 19.97 ml/m  RA Volume:   23.70 ml  14.74 ml/m LA Vol (A4C):   28.8 ml 17.92 ml/m LA Biplane Vol: 33.1 ml 20.59 ml/m  AORTIC VALVE LVOT Vmax:   108.00 cm/s LVOT Vmean:  75.600 cm/s LVOT VTI:    0.220 m  AORTA Ao Root diam: 2.90 cm Ao Asc diam:  3.30 cm MITRAL VALVE MV Area (PHT): 5.13 cm     SHUNTS MV Decel Time: 148 msec     Systemic VTI:  0.22 m MV E velocity: 69.20 cm/s   Systemic Diam: 1.90 cm MV A velocity: 111.00 cm/s MV E/A ratio:  0.62 Photographer signed by Carolan Clines Signature Date/Time: 03/03/2022/9:25:29 AM    Final    CT Angio Chest PE W and/or Wo Contrast  Result Date: 03/02/2022 CLINICAL DATA:  Cavitary nodules, septic emboli, metastatic disease on CT chest 02/28/2022 EXAM: CT ANGIOGRAPHY CHEST WITH CONTRAST TECHNIQUE: Multidetector CT imaging of the chest was performed using the standard protocol during bolus administration of intravenous contrast. Multiplanar CT image reconstructions and MIPs were obtained to evaluate the vascular anatomy. RADIATION DOSE REDUCTION: This exam was performed according to the departmental dose-optimization program which includes automated exposure control, adjustment of the mA and/or kV according to patient size and/or use of iterative reconstruction technique. CONTRAST:  42mL OMNIPAQUE IOHEXOL 350 MG/ML SOLN COMPARISON:  CT chest 02/28/2022 FINDINGS: Cardiovascular: Satisfactory opacification of the pulmonary arteries to the segmental level. No evidence of pulmonary embolism. Normal heart size. No pericardial effusion. Aortic and coronary artery atherosclerotic calcification. Mediastinum/Nodes: No enlarged mediastinal, hilar, or axillary lymph nodes. Thyroid gland, trachea, and esophagus demonstrate no significant findings. Lungs/Pleura: Advanced bilateral  emphysema. Biapical scarring. No pneumothorax or pleural effusion. Multiple solid pulmonary nodules are redemonstrated some of which are cavitary (on series 6): Right upper lobe nodule measuring 1.8 cm (image 29); Right upper lobe nodule measuring 0.9 cm (image 39); Cavitary right middle lobe nodule measuring 2.0 cm (image 69); Cavitary lesion along the right major fissure measuring 4.0 cm (image 65); Cavitary nodule in the right lower lobe measuring 1.8 cm (image 126); Irregular area of consolidation within the superior segment left lower lobe measuring 1.3 cm (image 47). These are not significantly changed from 02/28/2022. No new nodules. Upper Abdomen: No acute abnormality. Musculoskeletal: No chest wall abnormality. No acute or significant osseous findings. Review of the MIP images confirms the above findings. IMPRESSION: Negative for acute pulmonary embolism. Multiple unchanged large cavitary nodules throughout the right lung and irregular density in the left lower lobe. These remain favored to represent septic emboli however metastatic disease is not excluded. Short interval follow-up is recommended in 2-3 weeks. Coronary artery atherosclerotic. Aortic Atherosclerosis (ICD10-I70.0) and Emphysema (ICD10-J43.9). Electronically Signed   By: Minerva Fester M.D.   On: 03/02/2022 21:57    Pending Labs Unresulted Labs (From admission, onward)     Start     Ordered   03/04/22 0500  Basic metabolic panel  Tomorrow morning,   R        03/03/22 0756   03/04/22 0500  CBC  Tomorrow morning,   R        03/03/22 0756   03/04/22 0500  Vitamin B12  Tomorrow morning,   R        03/03/22 0758   03/03/22 0839  MRSA Next Gen by PCR, Nasal  Once,   R        03/03/22 2992  03/03/22 0837  Expectorated Sputum Assessment w Gram Stain, Rflx to Resp Cult  Once,   R        03/03/22 0836            Vitals/Pain Today's Vitals   03/03/22 1515 03/03/22 1525 03/03/22 1530 03/03/22 1545  BP: (!) 149/89  (!) 155/85  136/82  Pulse: 82  96 78  Resp: 16  20 20   Temp:  (!) 97.5 F (36.4 C)    TempSrc:  Temporal    SpO2: 95%  96% 95%  Weight:      Height:      PainSc:        Isolation Precautions No active isolations  Medications Medications  enoxaparin (LOVENOX) injection 40 mg (40 mg Subcutaneous Given 03/03/22 1204)  potassium chloride SA (KLOR-CON M) CR tablet 40 mEq (40 mEq Oral Given 03/03/22 1203)  oxyCODONE (Oxy IR/ROXICODONE) immediate release tablet 15 mg (has no administration in time range)  diltiazem (CARDIZEM CD) 24 hr capsule 180 mg (180 mg Oral Given 03/03/22 1304)  fluticasone furoate-vilanterol (BREO ELLIPTA) 100-25 MCG/ACT 1 puff (1 puff Inhalation Not Given 03/03/22 1147)    And  umeclidinium bromide (INCRUSE ELLIPTA) 62.5 MCG/ACT 1 puff (1 puff Inhalation Not Given 03/03/22 1147)  ipratropium-albuterol (DUONEB) 0.5-2.5 (3) MG/3ML nebulizer solution 3 mL (has no administration in time range)  Ampicillin-Sulbactam (UNASYN) 3 g in sodium chloride 0.9 % 100 mL IVPB (0 g Intravenous Stopped 03/03/22 1251)  vancomycin (VANCOCIN) IVPB 1000 mg/200 mL premix (0 mg Intravenous Stopped 03/03/22 1417)  losartan (COZAAR) tablet 25 mg (has no administration in time range)  iohexol (OMNIPAQUE) 350 MG/ML injection 51 mL (51 mLs Intravenous Contrast Given 03/02/22 2138)  ipratropium-albuterol (DUONEB) 0.5-2.5 (3) MG/3ML nebulizer solution 3 mL (3 mLs Nebulization Given 03/03/22 0545)  cefTRIAXone (ROCEPHIN) 1 g in sodium chloride 0.9 % 100 mL IVPB (0 g Intravenous Stopped 03/03/22 0703)  azithromycin (ZITHROMAX) 500 mg in sodium chloride 0.9 % 250 mL IVPB (0 mg Intravenous Stopped 03/03/22 1148)    Mobility walks Low fall risk   Focused Assessments Pulmonary Assessment Handoff:  Lung sounds: L Breath Sounds: Clear R Breath Sounds: Expiratory wheezes O2 Device: Room Air      R Recommendations: See Admitting Provider Note  Report given to:   Additional Notes:

## 2022-03-04 ENCOUNTER — Telehealth: Payer: Self-pay | Admitting: Pulmonary Disease

## 2022-03-04 DIAGNOSIS — I76 Septic arterial embolism: Secondary | ICD-10-CM

## 2022-03-04 DIAGNOSIS — J984 Other disorders of lung: Secondary | ICD-10-CM | POA: Diagnosis not present

## 2022-03-04 DIAGNOSIS — J449 Chronic obstructive pulmonary disease, unspecified: Secondary | ICD-10-CM | POA: Diagnosis not present

## 2022-03-04 DIAGNOSIS — G894 Chronic pain syndrome: Secondary | ICD-10-CM | POA: Diagnosis not present

## 2022-03-04 DIAGNOSIS — I10 Essential (primary) hypertension: Secondary | ICD-10-CM | POA: Diagnosis not present

## 2022-03-04 LAB — GLUCOSE, CAPILLARY: Glucose-Capillary: 90 mg/dL (ref 70–99)

## 2022-03-04 LAB — CBC
HCT: 37 % (ref 36.0–46.0)
Hemoglobin: 12.1 g/dL (ref 12.0–15.0)
MCH: 33.3 pg (ref 26.0–34.0)
MCHC: 32.7 g/dL (ref 30.0–36.0)
MCV: 101.9 fL — ABNORMAL HIGH (ref 80.0–100.0)
Platelets: 292 10*3/uL (ref 150–400)
RBC: 3.63 MIL/uL — ABNORMAL LOW (ref 3.87–5.11)
RDW: 15.9 % — ABNORMAL HIGH (ref 11.5–15.5)
WBC: 7.5 10*3/uL (ref 4.0–10.5)
nRBC: 0 % (ref 0.0–0.2)

## 2022-03-04 LAB — BASIC METABOLIC PANEL
Anion gap: 9 (ref 5–15)
BUN: 13 mg/dL (ref 8–23)
CO2: 25 mmol/L (ref 22–32)
Calcium: 8.8 mg/dL — ABNORMAL LOW (ref 8.9–10.3)
Chloride: 105 mmol/L (ref 98–111)
Creatinine, Ser: 0.52 mg/dL (ref 0.44–1.00)
GFR, Estimated: >60 mL/min (ref >60)
Glucose, Bld: 98 mg/dL (ref 70–99)
Potassium: 4 mmol/L (ref 3.5–5.1)
Sodium: 139 mmol/L (ref 135–145)

## 2022-03-04 LAB — VITAMIN B12: Vitamin B-12: 374 pg/mL (ref 180–914)

## 2022-03-04 LAB — PROCALCITONIN: Procalcitonin: <0.1 ng/mL

## 2022-03-04 NOTE — Progress Notes (Signed)
Subjective:   Summary: Teresa Edwards is a 71 y.o. year old female currently admitted on the IMTS HD#1 for progressive dyspnea with exertion and finding of possible septic emboli on CT.  Overnight Events: NOE   Pt was seen at bedside this morning. She appears to be comfortable, and endorses dyspnea still upon walking from her bed to the bathroom. She is eating and sleeping well.   Objective:  Vital signs in last 24 hours: Vitals:   03/04/22 0047 03/04/22 0605 03/04/22 0713 03/04/22 0746  BP: (!) 155/85 105/81 (!) 150/77   Pulse: 72 66 68   Resp: 15 15 12    Temp: 98.5 F (36.9 C) 98.4 F (36.9 C) 97.9 F (36.6 C)   TempSrc: Oral Oral Oral   SpO2: 94% 92% 92% 96%  Weight:  53.5 kg    Height:       Supplemental O2: Room Air SpO2: 96 %   Physical Exam:  Constitutional: well-appearing elderly woman sitting in bed, in no acute distress Cardiovascular: RRR, no murmurs, rubs or gallops Pulmonary/Chest: normal work of breathing on room air, lungs clear to auscultation bilaterally Abdominal: soft, non-tender, non-distended Skin: warm and dry Extremities: upper/lower extremity pulses 2+, no lower extremity edema present, well-healed wound on right shin that has scabbed over  [facility] Weights   03/03/22 0800 03/03/22 1851 03/04/22 0605  Weight: 54 kg 54.5 kg 53.5 kg     Intake/Output Summary (Last 24 hours) at 03/04/2022 0929 Last data filed at 03/04/2022 0830 Gross per 24 hour  Intake 1096.22 ml  Output --  Net 1096.22 ml   Net IO Since Admission: 1,096.22 mL [03/04/22 0929]  Pertinent Labs:    Latest Ref Rng & Units 03/04/2022    3:10 AM 03/02/2022    6:08 PM 03/02/2022    6:00 PM  CBC  WBC 4.0 - 10.5 K/uL 7.5   9.7   Hemoglobin 12.0 - 15.0 g/dL 03/04/2022  80.9  98.3   Hematocrit 36.0 - 46.0 % 37.0  42.0  41.2   Platelets 150 - 400 K/uL 292   353        Latest Ref Rng & Units 03/04/2022    3:10 AM 03/02/2022    6:08 PM 03/02/2022    6:00 PM  CMP   Glucose 70 - 99 mg/dL 98  03/04/2022  505   BUN 8 - 23 mg/dL 13  13  12    Creatinine 0.44 - 1.00 mg/dL 397   6.73   Sodium 135 - 145 mmol/L 139  140  139   Potassium 3.5 - 5.1 mmol/L 4.0  3.2  3.3   Chloride 98 - 111 mmol/L 105  103  106   CO2 22 - 32 mmol/L 25   24   Calcium 8.9 - 10.3 mg/dL 8.8   9.0   Total Protein 6.5 - 8.1 g/dL   6.5   Total Bilirubin 0.3 - 1.2 mg/dL   0.2   Alkaline Phos 38 - 126 U/L   91   AST 15 - 41 U/L   14   ALT 0 - 44 U/L   9      Assessment/Plan:   Principal Problem:   Cavitary lung disease Active Problems:   Cavitating mass in right lower lung lobe   History of DVT (deep vein thrombosis)   HTN (hypertension)   Chronic pain syndrome  Patient Summary: Teresa Edwards is a 71 y.o. with a pertinent PMH of COPD, pulmonary nodules, tobacco use disorder, DVT, HTN, lower back DJD on chronic opioids, who presented with dyspnea and admitted for septic emboli findings on CT scan .    #Dyspnea on Exertion  #New Cavitary Lung Nodules from unknown source Pt initially had a CT scan of chest done on 2/22, which showed a 1.44mm RML nodule, which had decreased in size from 2.5mm. Most recent CT showed persistent/enlarging pulmonary nodules, and with increased dyspnea she was sent to the ED from her PCP. CTA done in the ED revealed no sign of PE.   Increased size and appearance of new emboli could be due to infection, malignancy, or septic emboli.   Pt is up to date on most recent cancer screenings, however she has denied multiple times to undergo a bronchoscopy to characterize nodules. Of note, she has also had a weight loss of about 11 pounds in the last two months, and has a long smoking history and is still currently smoking.   She has also undergone a dental procedure recently where she had 7 teeth removed in June, and 7 in July. She states that one of them were infective, and was not given any antibiotics. This could be a potential source of septic emboli.    Infection can not be ruled out at this moment. She has been afebrile with no leukocytosis. Currently awaiting blood cultures.    She is currently denying any SOB at rest, fever, chills, vomiting, nausea.   Plan:  - Appreciate pulmonology recommendations  - Waiting for MRSA swab results - Waiting for Blood cultures and respiratory cultures - Started on Vanc and Unasyn  -If cultures are negative can change Vanc to Augmentin  - Outpatient bronchoscopy  #COPD Resume trelegy, duonebs as needed   #HTN Home meds include diltiazem and losartan -Continue diltiazem 100 mg   #Lower Lumbar Degenerative Disc Disease on chronic opioid -Resume oxycodone 15mg  Q8H PRN   Code: Full    Dispo: Anticipated discharge in less than two midnights   , MD PGY-1 Internal Medicine Resident Pager Number [phone] Please contact the on call pager after 5 pm and on weekends at [phone].

## 2022-03-04 NOTE — Plan of Care (Signed)
  Problem: Activity: Goal: Risk for activity intolerance will decrease Outcome: Progressing   Problem: Coping: Goal: Level of anxiety will decrease Outcome: Progressing   Problem: Safety: Goal: Ability to remain free from injury will improve Outcome: Progressing   

## 2022-03-04 NOTE — Plan of Care (Signed)
  Problem: Clinical Measurements: Goal: Respiratory complications will improve Outcome: Progressing   Problem: Activity: Goal: Risk for activity intolerance will decrease Outcome: Progressing   

## 2022-03-04 NOTE — Plan of Care (Signed)
  Problem: Clinical Measurements: Goal: Will remain free from infection Outcome: Progressing Goal: Diagnostic test results will improve Outcome: Progressing Goal: Respiratory complications will improve Outcome: Progressing   Problem: Activity: Goal: Risk for activity intolerance will decrease 03/04/2022 2234 by Carolanne Grumbling, RN Outcome: Progressing 03/04/2022 2230 by Carolanne Grumbling, RN Outcome: Progressing   Problem: Safety: Goal: Ability to remain free from injury will improve Outcome: Progressing

## 2022-03-04 NOTE — Telephone Encounter (Signed)
Please arrange hospital follow up with Dr. Thora Lance in 2-4 weeks for pulmonary nodules.  Thanks, JD

## 2022-03-04 NOTE — Consult Note (Signed)
NAME:  Teresa Edwards, MRN:  [id number], DOB:  06/22/1951, LOS: 1 ADMISSION DATE:  03/02/2022 CONSULTATION DATE:  03/02/2022 REFERRING MD:  Cyndie Chime - IMTS CHIEF COMPLAINT:  SOB/DOE, pulmonary nodules   History of Present Illness:  Teresa Edwards is seen in consultation at the request of IMTS for further evaluation and management of SOB/DOE in the setting of multiple pulmonary nodules.  71 year old woman who presented to [facility] ED 8/17 from her PCP's office for SOB/DOE. PMHx significant for COPD (no home O2 requirement), multiple pulmonary nodules (initially noted 03/2021), tobacco abuse, femoral DVT with thrombophlebitis, DDD, DJD. Also reports extensive dental extractions over the 2 months prior to admission (last 01/2022).  Patient reports initial fatigue noted around 2 months PTA with worsening SOB/DOE noted about 2 weeks PTA. Denies significant cough, nothing productive/no hemoptysis. Denies fever/chills or significant weight loss. She normally can ambulate independently around her home/complete ADLs but fatigues quickly even ambulating to the bathroom. Uses a cane. She reports getting SOB/tachycardic with HR up to 120s while ambulating. She does not require home O2. Utilizes Trelegy daily and albuterol PRN for rescue, approximately 2-3 times/week. She currently smokes but has cut down significantly from 1PPD to 2-3 cigarettes/day. Denies recent travel, household pets. 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 Recently bumped her anterior RLE while getting into a F-350 and developed a hematoma at that site (resolving).  On ED presentation, patient c/o DOE/SOB and concern for abnormal CT Chest. Initial concern for PE and CTA Chest completed demonstrating multiple large cavitary nodules throughout the R lung and irregular density in the LL (septic emboli vs. metastatic disease); no PE noted. TEE completed and no e/o ABE/valvular vegetations. Blood Cx pending. Resp Cx pending. COVID  negative. Labs grossly unremarkable with the exception of hypokalemia. Admitted to IMTS service.  PCCM consulted for further evaluation/recommendations.  Pertinent Medical History:   Past Medical History:  Diagnosis Date   COPD (chronic obstructive pulmonary disease) (HCC)    DDD (degenerative disc disease)    DJD (degenerative joint disease)    DVT femoral (deep venous thrombosis) with thrombophlebitis (HCC)    Significant Hospital Events: Including procedures, antibiotic start and stop dates in addition to other pertinent events   8/17 - Admitted to IMTS for SOB/DOE, pulmonary nodules with c/f septic emboli. 8/18 - PCCM consulted for further evaluation/management of pulmonary nodules.  Interim History / Subjective:   No acute events overnight  Objective:  Blood pressure 134/83, pulse 74, temperature 97.8 F (36.6 C), temperature source Oral, resp. rate 13, height 5\' 4"  (1.626 m), weight 53.5 kg, SpO2 91 %.        Intake/Output Summary (Last 24 hours) at 03/04/2022 1417 Last data filed at 03/04/2022 1300 Gross per 24 hour  Intake 1231 ml  Output --  Net 1231 ml   Filed Weights   03/03/22 0800 03/03/22 1851 03/04/22 0605  Weight: 54 kg 54.5 kg 53.5 kg   Physical Examination: General: Chronically ill-appearing elderly woman in NAD. HEENT: [place]/AT, anicteric sclera, PERRL, dry mucous membranes. Neuro: Awake, oriented x 4. Responds to verbal stimuli. Following commands consistently. Moves all 4 extremities spontaneously. CV: RRR, no m/g/r. PULM: Breathing even and unlabored on RA. No wheezing. GI: Soft, nontender, nondistended. Normoactive bowel sounds. Extremities: No significant LE edema noted. Skin: Warm/dry, no rashes. Anterior RLE with healing hematoma with swelling and scab vs. eschar.  Resolved Hospital Problem List:    Assessment & Plan:  Ms. Thalman is seen in  consultation at the request of IMTS for further evaluation and management of SOB/DOE in the setting of  multiple pulmonary nodules.  71 year old woman who presented to [facility] ED 8/17 from her PCP's office for SOB/DOE. PMHx significant for COPD (no home O2 requirement), multiple pulmonary nodules (initially noted 03/2021), tobacco abuse, femoral DVT with thrombophlebitis, DDD, DJD. Also reports extensive dental extractions over the 2 months prior to admission (last 01/2022).  Multiple pulmonary nodules Initially seen on CT 08/2020. Seen by Dr. Thora Lance [facility]) 03/2021 and at that time noted to have dominant 1.6cm RML nodule, decreased from 2.37mm on previous scan) .with recommendation for repeat scan in 6 months. Repeat CT at [facility]) with persistent/?enlarging pulmonary nodules; concern from PCP in the setting of worsening symptoms. Differential includes septic emboli, atypical infection/NTM, malignancy. - Obtain MRSA swab - Continue Vanc + unasyn for now - F/u Bcx, Resp Cx - f/u quantiferon gold - If Cx remain positive or ongoing concern for bacteremia, low threshold for TEE to definitively r/o vegetation/ABE - If Cx negative, deescalate antibiotics from Vanc/unasyn to Augmentin x 4 week course - Will arrange outpatient follow up with Dr. Thora Lance for consideration of nav bronch for biopsies if lesions do not resolve with extended antibiotic therapy May benefit from NAV bronch to further characterize nodules, would recommend as outpatient with Dr. Thora Lance  COPD Tobacco abuse - Supplemental O2 support as needed - Goal O2 sat 88-92% - Bronchodilators (Trelegy Ellipta, Albuterol PRN) - Pulmonary hygiene - Intermittent CXR - Encourage smoking cessation; she has cut back significantly  PCCM will sign off. Please call with any further questions.  Best Practice: (right click and "Reselect all SmartList Selections" daily)   Per Primary Team  Labs:  CBC: Recent Labs  Lab 03/02/22 1800 03/02/22 1808 03/04/22 0310  WBC 9.7  --  7.5  NEUTROABS 6.7  --   --   HGB 12.9 14.3 12.1   HCT 41.2 42.0 37.0  MCV 104.8*  --  101.9*  PLT 353  --  292   Basic Metabolic Panel: Recent Labs  Lab 03/02/22 1800 03/02/22 1808 03/04/22 0310  NA 139 140 139  K 3.3* 3.2* 4.0  CL 106 103 105  CO2 24  --  25  GLUCOSE 143* 141* 98  BUN 12 13 13   CREATININE 0.73 0.50 0.52  CALCIUM 9.0  --  8.8*   GFR: Estimated Creatinine Clearance: 54.5 mL/min (by C-G formula based on SCr of 0.52 mg/dL). Recent Labs  Lab 03/02/22 1800 03/04/22 0310  WBC 9.7 7.5  LATICACIDVEN 1.7  --    Liver Function Tests: Recent Labs  Lab 03/02/22 1800  AST 14*  ALT 9  ALKPHOS 91  BILITOT 0.2*  PROT 6.5  ALBUMIN 2.9*   No results for input(s): "LIPASE", "AMYLASE" in the last 168 hours. No results for input(s): "AMMONIA" in the last 168 hours.  ABG:    Component Value Date/Time   TCO2 23 03/02/2022 1808   Coagulation Profile: No results for input(s): "INR", "PROTIME" in the last 168 hours.  Cardiac Enzymes: No results for input(s): "CKTOTAL", "CKMB", "CKMBINDEX", "TROPONINI" in the last 168 hours.  HbA1C: No results found for: "HGBA1C"  CBG: Recent Labs  Lab 03/03/22 0903 03/04/22 0643  GLUCAP 100* 90   Review of Systems:   Review of systems completed with pertinent positives/negatives outlined in above HPI.  Past Medical History:  She,  has a past medical history of COPD (chronic obstructive pulmonary disease) (HCC), DDD (  degenerative disc disease), DJD (degenerative joint disease), and DVT femoral (deep venous thrombosis) with thrombophlebitis (HCC).   Surgical History:   Past Surgical History:  Procedure Laterality Date   TOTAL HIP ARTHROPLASTY Right    TUBAL LIGATION  10/1971   Social History:   reports that she has been smoking cigarettes. She has a 20.00 pack-year smoking history. She has never used smokeless tobacco. She reports that she does not drink alcohol and does not use drugs.   Family History:  Her family history includes Cancer in her brother; Deep  vein thrombosis in her mother; Hypertension in her sister; Other in her mother and sister.   Allergies: No Known Allergies   Home Medications: Prior to Admission medications   Medication Sig Start Date End Date Taking? Authorizing Provider  albuterol (PROVENTIL HFA;VENTOLIN HFA) 108 (90 Base) MCG/ACT inhaler Inhale 2 puffs into the lungs every 6 (six) hours as needed for wheezing or shortness of breath. 11/03/16  Yes Erick Blinks, MD  albuterol (PROVENTIL) (2.5 MG/3ML) 0.083% nebulizer solution Take 3 mLs (2.5 mg total) by nebulization every 6 (six) hours as needed for wheezing or shortness of breath. 11/03/16  Yes Erick Blinks, MD  diltiazem (CARDIZEM CD) 180 MG 24 hr capsule Take 180 mg by mouth daily.   Yes [provider]  losartan (COZAAR) 25 MG tablet Take 25 mg by mouth daily. 02/24/22  Yes [provider]  oxycodone (ROXICODONE) 30 MG immediate release tablet Take 15-30 mg by mouth every 4 (four) hours as needed for pain.    Yes [provider]  TRELEGY ELLIPTA 100-62.5-25 MCG/INH AEPB Inhale 1 puff into the lungs daily. 03/01/21  Yes [provider]  doxycycline (VIBRA-TABS) 100 MG tablet Take 100 mg by mouth 2 (two) times daily. 03/02/22   [provider]    Signature:   Melody Comas, MD [facility] Office: [phone]   See Amion for personal pager PCCM on call pager [phone] until 7pm. Please call Elink 7p-7a. [phone]

## 2022-03-05 LAB — GLUCOSE, CAPILLARY: Glucose-Capillary: 98 mg/dL (ref 70–99)

## 2022-03-05 LAB — MRSA NEXT GEN BY PCR, NASAL: MRSA by PCR Next Gen: NOT DETECTED

## 2022-03-05 MED ORDER — AMOXICILLIN-POT CLAVULANATE 500-125 MG PO TABS: 1.0000 | ORAL_TABLET | Freq: Three times a day (TID) | ORAL | 0 refills | Status: DC

## 2022-03-05 NOTE — Progress Notes (Signed)
Subjective:   Summary: Teresa Edwards is a 71 y.o. year old female currently admitted on the IMTS HD#2 for progressive dyspnea with exertion and finding of septic emboli on CT.  Overnight Events: NOE   Pt was seen at bedside this morning. Comfortable appearing, and still endorses dyspnea when she walks to the bathroom. She has an appetite and is sleeping well.   Objective:  Vital signs in last 24 hours: Vitals:   03/04/22 1151 03/04/22 1927 03/05/22 0452 03/05/22 0736  BP: 134/83 (!) 150/78 (!) 159/76 (!) 141/77  Pulse: 74 71 70 76  Resp: 13 16 14 14   Temp: 97.8 F (36.6 C) 98.2 F (36.8 C) 98.2 F (36.8 C) 98.3 F (36.8 C)  TempSrc: Oral Oral Oral Oral  SpO2: 91% 94% 93% 91%  Weight:   53 kg   Height:       Supplemental O2: Room Air SpO2: 91 %   Physical Exam:  Constitutional: well-appearing elderly woman sitting in bed, in no acute distress Cardiovascular: RRR, no murmurs, rubs or gallops Pulmonary/Chest: normal work of breathing on room air, expiration wheezes heard on lower lobes b/l Abdominal: soft, non-tender, non-distended Skin: warm and dry Extremities: upper/lower extremity pulses 2+, no lower extremity edema present  Filed Weights   03/03/22 1851 03/04/22 0605 03/05/22 0452  Weight: 54.5 kg 53.5 kg 53 kg     Intake/Output Summary (Last 24 hours) at 03/05/2022 1026 Last data filed at 03/05/2022 0900 Gross per 24 hour  Intake 717 ml  Output 500 ml  Net 217 ml   Net IO Since Admission: 1,313.22 mL [03/05/22 1026]  Pertinent Labs:    Latest Ref Rng & Units 03/04/2022    3:10 AM 03/02/2022    6:08 PM 03/02/2022    6:00 PM  CBC  WBC 4.0 - 10.5 K/uL 7.5   9.7   Hemoglobin 12.0 - 15.0 g/dL 03/04/2022  81.4  48.1   Hematocrit 36.0 - 46.0 % 37.0  42.0  41.2   Platelets 150 - 400 K/uL 292   353        Latest Ref Rng & Units 03/04/2022    3:10 AM 03/02/2022    6:08 PM 03/02/2022    6:00 PM  CMP  Glucose 70 - 99 mg/dL 98  03/04/2022  314   BUN  8 - 23 mg/dL 13  13  12    Creatinine 0.44 - 1.00 mg/dL 970   2.63   Sodium 135 - 145 mmol/L 139  140  139   Potassium 3.5 - 5.1 mmol/L 4.0  3.2  3.3   Chloride 98 - 111 mmol/L 105  103  106   CO2 22 - 32 mmol/L 25   24   Calcium 8.9 - 10.3 mg/dL 8.8   9.0   Total Protein 6.5 - 8.1 g/dL   6.5   Total Bilirubin 0.3 - 1.2 mg/dL   0.2   Alkaline Phos 38 - 126 U/L   91   AST 15 - 41 U/L   14   ALT 0 - 44 U/L   9     Assessment/Plan:   Principal Problem:   Cavitary lung disease Active Problems:   Cavitating mass in right lower lung lobe   History of DVT (deep vein thrombosis)   HTN (hypertension)   Chronic pain syndrome   Septic embolism [facility]   Patient Summary:  Teresa Edwards is a 71 y.o. with a pertinent PMH of COPD, pulmonary nodules, DVT, HTN, lower back DJD on chronic opioids, who presented with dyspnea and admitted for septic emboli.    #Dyspnea on Exertion #New Cavitary Lung Nodules from unknown source  Pt initially had a CT scan of chest done on 2/22, which showed a 1.80mm RML nodule, which had decreased in size from 2.35mm. Most recent CT showed persistent/enlarging pulmonary nodules, and with increased dyspnea she was sent tot he ED from her PCP. CTA revealed no signs of PE  Currently awaiting bacterial cultures and MRSA swab. Low suspicion for MRSA at this time because pt appears comfortable, afebrile, and no leukocytosis has been shown.   Plan:  - Await cultures and swab  - If cultures are negative, well D/C Vanc and Unasyn to [place]in 4 week course  - F/U outpatient with Dr. Thora Lance - Plan for D/C tomorrow or the next day.    #COPD Resume trelegy, duonebs as needed  #HTN Home meds include diltiazem and losartan  - Continue dilitazem   #Lower lumbar Degenerative Disc Disease on chornic opioid -Oxycodone 15mg  Q8H    Code: Full   Dispo: Anticipated discharge in less than two midnights   , MD PGY-1 Internal Medicine Resident Pager  Number [phone] Please contact the on call pager after 5 pm and on weekends at [phone].

## 2022-03-05 NOTE — Discharge Instructions (Signed)
You were admitted for shortness of breath. It looks like the infection you had has gotten better! We would like you to continue antibiotic treatment with an antibiotic called Augmentin. Please take it three times a day for 4 weeks. Please make follow up appointments with Dr. Thora Lance and your primary care physician!  It was a pleasure taking care of you!

## 2022-03-05 NOTE — Discharge Summary (Cosign Needed Addendum)
Name: Teresa Edwards MRN: [id number] DOB: 1950-08-30 71 y.o. PCP: Donetta Potts, MD  Date of Admission: 03/02/2022  5:06 PM Date of Discharge: No discharge date for patient encounter. Attending Physician: Gust Rung, DO  Discharge Diagnosis: 1. Principal Problem:   Cavitary lung disease Active Problems:   Cavitating mass in right lower lung lobe   History of DVT (deep vein thrombosis)   HTN (hypertension)   Chronic pain syndrome   Septic embolism [facility])   Discharge Medications: Allergies as of 03/05/2022   No Known Allergies      Medication List     STOP taking these medications    doxycycline 100 MG tablet Commonly known as: VIBRA-TABS       TAKE these medications    albuterol 108 (90 Base) MCG/ACT inhaler Commonly known as: VENTOLIN HFA Inhale 2 puffs into the lungs every 6 (six) hours as needed for wheezing or shortness of breath.   albuterol (2.5 MG/3ML) 0.083% nebulizer solution Commonly known as: PROVENTIL Take 3 mLs (2.5 mg total) by nebulization every 6 (six) hours as needed for wheezing or shortness of breath.   amoxicillin-clavulanate 500-125 MG tablet Commonly known as: Augmentin Take 1 tablet (500 mg total) by mouth 3 (three) times daily.   diltiazem 180 MG 24 hr capsule Commonly known as: CARDIZEM CD Take 180 mg by mouth daily.   losartan 25 MG tablet Commonly known as: COZAAR Take 25 mg by mouth daily.   oxycodone 30 MG immediate release tablet Commonly known as: ROXICODONE Take 15-30 mg by mouth every 4 (four) hours as needed for pain.   Trelegy Ellipta 100-62.5-25 MCG/ACT Aepb Generic drug: Fluticasone-Umeclidin-Vilant Inhale 1 puff into the lungs daily.        Disposition and follow-up:   Teresa Edwards was discharged from [facility] in Good condition.  At the hospital follow up visit please address:  1.  Signs of  worsening respiratory function  2.  Labs / imaging needed at time of  follow-up: N/A  3.  Pending labs/ test needing follow-up: Blood cultures and MRSA swab  Follow-up Appointments:  Please make appointment with your primary care physician, as well as your pulmonologist Dr. Harlan Stains Course by problem list:  #Dyspnea on Exertion #New Cavitary Lung Nodules from Unknown Source Teresa Edwards is a 28 ear old female with PMH of COPD, pulmonary nodules, tobacco use disorder, DVT, HTN, low back DJD on chornic opioids who arrived in the ED for progressive dyspnea and exertion. CT scan was done which revealed septic emboli. Pt initially had a CT scan of chest done on 2/22, which showed a 1.3mm RML nodule, which had decreased in size from 2.55mm. Most recent CT showed persistent/enlarging pulmonary nodules. Acid-fast sputums were done which were negative.   TEE was done which showed no valvular vegetations. COVID Negative, and labs were unremarkable throughout hospital course.   She was started on vancomycin and unacyn IV for empiric coverage, and bacterial cultures and MRSA swab were obtained.  Cultures are still pending, however low suspicion for MRSA at this time because pt appears comfortable, afebrile, and no leukocytosis has been shown. Pt would benefit from bronchoscopy to characterize emboli, however she has declined the procedure multiple times.   #COPD Pt has smoked one pack a day, but wasn't able to characterize the timeline. She has recently cut down to 2 cigarettes a day. We continued Trelegy and used duonebs as needed, while counseling her on cessation  HTN: Home regimen included diltiazem and losartan. Losartan was held due to blood pressure being stable, and was continued on diltiazem 100mg .   #Lower Lumbar Degenerative Disc Disease on Chronic Opioid Pt has a long history of this, and was taking oxycodone 30 mg Q4H for pain. We started Oxy 15mg  Q8H PRN      Discharge Exam:   BP (!) 141/77 (BP Location: Left Arm)   Pulse 76   Temp 98.3 F (36.8  C) (Oral)   Resp 14   Ht 5\' 4"  (1.626 m)   Wt 53 kg   SpO2 91%   BMI 20.05 kg/m  Discharge exam:   Constitutional: well-appearing elderly woman sitting in bed, in no acute distress Cardiovascular: RRR, no murmurs, rubs or gallops Pulmonary/Chest: normal work of breathing on room air, expiration wheezes heard on lower lobes b/l Abdominal: soft, non-tender, non-distended Skin: warm and dry Extremities: upper/lower extremity pulses 2+, no lower extremity edema present  Pertinent Labs, Studies, and Procedures:     Latest Ref Rng & Units 03/04/2022    3:10 AM 03/02/2022    6:08 PM 03/02/2022    6:00 PM  CBC  WBC 4.0 - 10.5 K/uL 7.5   9.7   Hemoglobin 12.0 - 15.0 g/dL 03/06/2022  03/04/2022  03/04/2022   Hematocrit 36.0 - 46.0 % 37.0  42.0  41.2   Platelets 150 - 400 K/uL 292   353        Latest Ref Rng & Units 03/04/2022    3:10 AM 03/02/2022    6:08 PM 03/02/2022    6:00 PM  BMP  Glucose 70 - 99 mg/dL 98  03/06/2022  03/04/2022   BUN 8 - 23 mg/dL 13  13  12    Creatinine 0.44 - 1.00 mg/dL 03/04/2022  242  353   Sodium 135 - 145 mmol/L 139  140  139   Potassium 3.5 - 5.1 mmol/L 4.0  3.2  3.3   Chloride 98 - 111 mmol/L 105  103  106   CO2 22 - 32 mmol/L 25   24   Calcium 8.9 - 10.3 mg/dL 8.8   9.0      Discharge Instructions: Discharge Instructions     Call MD for:  persistant nausea and vomiting   Complete by: As directed    Call MD for:  severe uncontrolled pain   Complete by: As directed    Call MD for:  temperature >100.4   Complete by: As directed    Diet - low sodium heart healthy   Complete by: As directed    Increase activity slowly   Complete by: As directed        Signed: , MD 03/05/2022, 1:06 PM   Pager: [phone]

## 2022-03-06 ENCOUNTER — Ambulatory Visit: Payer: Medicare Other | Admitting: Nurse Practitioner

## 2022-03-07 DIAGNOSIS — J449 Chronic obstructive pulmonary disease, unspecified: Secondary | ICD-10-CM | POA: Diagnosis not present

## 2022-03-07 DIAGNOSIS — Z131 Encounter for screening for diabetes mellitus: Secondary | ICD-10-CM | POA: Diagnosis not present

## 2022-03-07 LAB — QUANTIFERON-TB GOLD PLUS (RQFGPL)
QuantiFERON Mitogen Value: >10 IU/mL
QuantiFERON Nil Value: 0.02 IU/mL
QuantiFERON TB1 Ag Value: 0.02 IU/mL
QuantiFERON TB2 Ag Value: 0.02 IU/mL

## 2022-03-07 LAB — ANCA TITERS
Atypical P-ANCA titer: <1:20 {titer}
C-ANCA: <1:20 {titer}
P-ANCA: <1:20 {titer}

## 2022-03-07 LAB — QUANTIFERON-TB GOLD PLUS: QuantiFERON-TB Gold Plus: NEGATIVE

## 2022-03-08 LAB — CULTURE, BLOOD (ROUTINE X 2)
Culture: NO GROWTH
Culture: NO GROWTH
Special Requests: ADEQUATE

## 2022-03-22 DIAGNOSIS — Z1231 Encounter for screening mammogram for malignant neoplasm of breast: Secondary | ICD-10-CM | POA: Diagnosis not present

## 2022-03-24 DIAGNOSIS — E1142 Type 2 diabetes mellitus with diabetic polyneuropathy: Secondary | ICD-10-CM | POA: Diagnosis not present

## 2022-03-24 DIAGNOSIS — G894 Chronic pain syndrome: Secondary | ICD-10-CM | POA: Diagnosis not present

## 2022-03-24 DIAGNOSIS — J449 Chronic obstructive pulmonary disease, unspecified: Secondary | ICD-10-CM | POA: Diagnosis not present

## 2022-03-24 DIAGNOSIS — R030 Elevated blood-pressure reading, without diagnosis of hypertension: Secondary | ICD-10-CM | POA: Diagnosis not present

## 2022-03-24 DIAGNOSIS — Z9181 History of falling: Secondary | ICD-10-CM | POA: Diagnosis not present

## 2022-03-24 DIAGNOSIS — G8929 Other chronic pain: Secondary | ICD-10-CM | POA: Diagnosis not present

## 2022-03-24 DIAGNOSIS — Z6820 Body mass index (BMI) 20.0-20.9, adult: Secondary | ICD-10-CM | POA: Diagnosis not present

## 2022-03-24 DIAGNOSIS — Z79899 Other long term (current) drug therapy: Secondary | ICD-10-CM | POA: Diagnosis not present

## 2022-03-24 DIAGNOSIS — M545 Low back pain, unspecified: Secondary | ICD-10-CM | POA: Diagnosis not present

## 2022-03-24 DIAGNOSIS — M539 Dorsopathy, unspecified: Secondary | ICD-10-CM | POA: Diagnosis not present

## 2022-04-03 ENCOUNTER — Ambulatory Visit: Payer: Medicare Other | Attending: Cardiology | Admitting: Cardiology

## 2022-04-03 ENCOUNTER — Encounter: Payer: Self-pay | Admitting: Cardiology

## 2022-04-03 VITALS — BP 124/70 | HR 96 | Ht 64.0 in | Wt 119.6 lb

## 2022-04-03 DIAGNOSIS — R002 Palpitations: Secondary | ICD-10-CM

## 2022-04-03 DIAGNOSIS — R918 Other nonspecific abnormal finding of lung field: Secondary | ICD-10-CM

## 2022-04-03 DIAGNOSIS — I10 Essential (primary) hypertension: Secondary | ICD-10-CM | POA: Diagnosis not present

## 2022-04-03 DIAGNOSIS — I700 Atherosclerosis of aorta: Secondary | ICD-10-CM | POA: Diagnosis not present

## 2022-04-03 NOTE — Progress Notes (Signed)
Cardiology Office Note  Date: 04/03/2022   ID: Teresa Edwards, DOB 06/01/1951, MRN [id number]  PCP:  Donetta Potts, MD  Cardiologist:  Nona Dell, MD Electrophysiologist:  None   Chief Complaint  Patient presents with   Shortness of Breath    History of Present Illness: Teresa Edwards is a 71 y.o. female referred for cardiology consultation by Dr. Mayford Knife with [facility] for evaluation of shortness of breath and feeling of rapid heartbeat when she exerts herself.  I reviewed her records and updated the chart.  She does not necessarily describe palpitations, no sudden onset of rapid heartbeat, essentially dyspnea on exertion that has been fairly chronic.  No exertional chest pain.  She was recently hospitalized in August for evaluation of shortness of breath.  CT imaging of the chest demonstrated cavitary lung nodules.  Sputum was negative for AFB.  Echocardiogram was without evidence of valvular vegetations to suggest septic emboli.  Also negative for COVID-19.  She was treated with broad-spectrum antibiotics and was set up to see pulmonary as an outpatient.  She declined bronchoscopy as an inpatient.  She also has underlying COPD with ongoing tobacco abuse.  She is under the impression that she did not need further outpatient pulmonary follow-up, but chart indicates that she was to be seen back in 2 to 4 weeks from discharge.  She had seen Dr. Thora Lance in [place] and would like to follow-up closer with the office in [place].  She mentions Dr. Craige Cotta.  I reviewed her medications, she continues on course of Augmentin.  She has been on Cardizem CD mainly for blood pressure control, this was increased by her PCP in the interim and she is also on losartan.  Recent ECG from August showed sinus rhythm with PAC.  She states that she has as needed oxygen at home, does not use it regularly.  Past Medical History:  Diagnosis Date   Aortic atherosclerosis (HCC)    Cavitary  lung disease    Chronic pain    COPD (chronic obstructive pulmonary disease) (HCC)    Coronary artery calcification seen on CT scan    DDD (degenerative disc disease)    DVT femoral (deep venous thrombosis) with thrombophlebitis (HCC)     Past Surgical History:  Procedure Laterality Date   TOTAL HIP ARTHROPLASTY Right    TUBAL LIGATION  10/1971    Current Outpatient Medications  Medication Sig Dispense Refill   albuterol (PROVENTIL HFA;VENTOLIN HFA) 108 (90 Base) MCG/ACT inhaler Inhale 2 puffs into the lungs every 6 (six) hours as needed for wheezing or shortness of breath. 1 Inhaler 2   albuterol (PROVENTIL) (2.5 MG/3ML) 0.083% nebulizer solution Take 3 mLs (2.5 mg total) by nebulization every 6 (six) hours as needed for wheezing or shortness of breath. 75 mL 12   amoxicillin-clavulanate (AUGMENTIN) 500-125 MG tablet Take 1 tablet (500 mg total) by mouth 3 (three) times daily. 84 tablet 0   diltiazem (CARDIZEM CD) 180 MG 24 hr capsule Take 180 mg by mouth daily.     losartan (COZAAR) 25 MG tablet Take 25 mg by mouth daily.     oxycodone (ROXICODONE) 30 MG immediate release tablet Take 15-30 mg by mouth every 4 (four) hours as needed for pain.      TRELEGY ELLIPTA 100-62.5-25 MCG/INH AEPB Inhale 1 puff into the lungs daily.     No current facility-administered medications for this visit.   Allergies:  Patient has no known allergies.   Social History:  The patient  reports that she has been smoking cigarettes. She has a 20.00 pack-year smoking history. She has never used smokeless tobacco. She reports that she does not drink alcohol and does not use drugs.   Family History: The patient's family history includes Cancer in her brother; Deep vein thrombosis in her mother; Hypertension in her sister; Other in her mother and sister.   ROS: No orthopnea or PND.  No leg swelling.  Slowly healing wound right anterior tibia (hit her leg on a running board on her truck).  Physical Exam: VS:   BP 124/70   Pulse 96   Ht 5\' 4"  (1.626 m)   Wt 119 lb 9.6 oz (54.3 kg)   SpO2 93%   BMI 20.53 kg/m , BMI Body mass index is 20.53 kg/m.  Wt Readings from Last 3 Encounters:  04/03/22 119 lb 9.6 oz (54.3 kg)  03/05/22 116 lb 12.8 oz (53 kg)  04/07/21 126 lb (57.2 kg)    General: Chronically ill-appearing woman in no distress.  HEENT: Conjunctiva and lids normal, oropharynx with poor dentition. Neck: Supple, no elevated JVP or carotid bruits, no thyromegaly. Lungs: Decreased breath sounds without wheezing. Cardiac: Regular rate and rhythm, no S3 or significant systolic murmur, no pericardial rub. Abdomen: Soft, nontender, bowel sounds present. Extremities: No pitting edema, distal pulses 2+.  Scab anterior right tibia. Skin: Warm and dry. Musculoskeletal: No kyphosis. Neuropsychiatric: Alert and oriented x3, affect grossly appropriate.  ECG:  An ECG dated 02/03/2022 was personally reviewed today and demonstrated:  Sinus rhythm with nonspecific ST-T changes.  Recent Labwork: 03/02/2022: ALT 9; AST 14 03/04/2022: BUN 13; Creatinine, Ser 0.52; Hemoglobin 12.1; Platelets 292; Potassium 4.0; Sodium 139   Other Studies Reviewed Today:  Chest CTA 03/02/2022: IMPRESSION: Negative for acute pulmonary embolism.   Multiple unchanged large cavitary nodules throughout the right lung and irregular density in the left lower lobe. These remain favored to represent septic emboli however metastatic disease is not excluded. Short interval follow-up is recommended in 2-3 weeks.   Coronary artery atherosclerotic.   Aortic Atherosclerosis (ICD10-I70.0) and Emphysema (ICD10-J43.9).  Echocardiogram 03/03/2022:  1. Left ventricular ejection fraction, by estimation, is 60 to 65%. The  left ventricle has normal function. The left ventricle has no regional  wall motion abnormalities. Left ventricular diastolic parameters are  consistent with Grade I diastolic  dysfunction (impaired relaxation).   2.  Right ventricular systolic function is normal. The right ventricular  size is normal. Tricuspid regurgitation signal is inadequate for assessing  PA pressure.   3. The mitral valve is normal in structure. No evidence of mitral valve  regurgitation.   4. The aortic valve is normal in structure. Aortic valve regurgitation is  not visualized.   5. The inferior vena cava is normal in size with greater than 50%  respiratory variability, suggesting right atrial pressure of 3 mmHg.   Assessment and Plan:  1.  Chronic dyspnea on exertion, general feeling of elevated heart rate when she exerts herself but not specifically palpitations or sudden onset tachycardia.  I reviewed her recent ECG showing sinus rhythm with PAC.  Echocardiogram shows normal LVEF at 60 to 65% with no major valvular abnormalities.  Suspect her symptoms are mostly pulmonary based on recent work-up, cannot entirely exclude arrhythmia and we did talk about placing a Zio patch, however she did not want to pursue any further testing at this time in terms of her heart.  She is on Cardizem CD for blood pressure  control, recently increased by PCP.  I did not make any other changes today.  2.  Cavitary lung nodules with COPD and probable component of chronic hypoxic respiratory failure.  I have recommended that she pursue continued outpatient pulmonary follow-up.  She would like to do this closer to home and we will get her set up to see Dr. Craige Cotta in [place] at her request.  She continues a course of Augmentin.  3.  Atherosclerosis evident by CT imaging.  Statin therapy would be recommended, I do not have any recent lipids for review.  She has follow-up with her PCP soon.  4.  Essential hypertension, on Cardizem CD and losartan.  Blood pressure is well controlled today.  Medication Adjustments/Labs and Tests Ordered: Current medicines are reviewed at length with the patient today.  Concerns regarding medicines are outlined above.    Tests Ordered: Orders Placed This Encounter  Procedures   Ambulatory referral to Pulmonology    Medication Changes: No orders of the defined types were placed in this encounter.   Disposition:  Follow up  6 months.  Signed, Jonelle Sidle, MD, [facility] 04/03/2022 1:49 PM    [facility] at [facility] [address] [place], [place], [place] [address] Phone: [phone]; Fax: [phone]

## 2022-04-03 NOTE — Patient Instructions (Signed)
Medication Instructions:  Your physician recommends that you continue on your current medications as directed. Please refer to the Current Medication list given to you today.   Labwork: none  Testing/Procedures: none  Follow-Up:  Your physician recommends that you schedule a follow-up appointment in: 6 months  Any Other Special Instructions Will Be Listed Below (If Applicable).  You have been referred to [facility] in [place] with Dr. Halford Chessman  If you need a refill on your cardiac medications before your next appointment, please call your pharmacy.

## 2022-04-04 DIAGNOSIS — Z6820 Body mass index (BMI) 20.0-20.9, adult: Secondary | ICD-10-CM | POA: Diagnosis not present

## 2022-04-04 DIAGNOSIS — I76 Septic arterial embolism: Secondary | ICD-10-CM | POA: Diagnosis not present

## 2022-04-11 NOTE — Progress Notes (Deleted)
Synopsis: Referred for pulmonary nodule by [place], MD  Subjective:   PATIENT ID: Teresa Edwards: female DOB: May 20, 1951, MRN: [id number]  No chief complaint on file.  71yM with COPD referred for pulmonary nodules (dominant 1.6cm RML decreased from  2.54m on 04/01/21 scan)  She smoked 50 years (~50 py smoking) active, down to 1 cigarette per day. She does have quite a bit of chest congestion. Finished course of antibiotics on Monday and that has helped quite a bit. She has no trouble swallowing, no aspiration. She has had no fever, she has had no weight loss, no night sweats drenching the sheets.   She has DOE to 50 yards. She is on trelegy. She has never done pulmonary rehab.   She has never had a bronchoscopy or TTNA.   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 She has no pets at home. No pet bird. No hot tub  Interval HPI: CT Chest during recent admission with concern for ?septic emboli but cx neg, TTE without evidence of vegetation. Placed on long course of augmentin  Otherwise pertinent review of systems is negative.  Past Medical History:  Diagnosis Date   Aortic atherosclerosis ([facility]    Cavitary lung disease    Chronic pain    COPD (chronic obstructive pulmonary disease) (HCC)    Coronary artery calcification seen on CT scan    DDD (degenerative disc disease)    DVT femoral (deep venous thrombosis) with thrombophlebitis (HCC)      Family History  Problem Relation Age of Onset   Deep vein thrombosis Mother    Other Mother        varicose veins   Hypertension Sister    Other Sister        varicose veins   Cancer Brother      Past Surgical History:  Procedure Laterality Date   TOTAL HIP ARTHROPLASTY Right    TUBAL LIGATION  10/1971    Social History   Socioeconomic History   Marital status:  Widowed    Spouse name: Not on file   Number of children: 2   Years of education: 139  Highest education level: Not on file  Occupational History   Occupation: homemaker  Tobacco Use   Smoking status: Every Day    Packs/day: 0.50    Years: 40.00    Total pack years: 20.00    Types: Cigarettes   Smokeless tobacco: Never   Tobacco comments:    Patient is down to 1 cig a day for 1 week. HSM.   Vaping Use   Vaping Use: Never used  Substance and Sexual Activity   Alcohol use: No    Alcohol/week: 0.0 standard drinks of alcohol   Drug use: No   Sexual activity: Not on file  Other Topics Concern   Not on file  Social History Narrative   Not on file   Social Determinants of Health   Financial Resource Strain: Not on file  Food Insecurity: Not on file  Transportation Needs: Not on file  Physical Activity: Not on file  Stress: Not on file  Social Connections: Not on file  Intimate Partner Violence: Not on file     No Known Allergies   Outpatient Medications Prior to Visit  Medication Sig Dispense Refill   albuterol (PROVENTIL HFA;VENTOLIN HFA)  108 (90 Base) MCG/ACT inhaler Inhale 2 puffs into the lungs every 6 (six) hours as needed for wheezing or shortness of breath. 1 Inhaler 2   albuterol (PROVENTIL) (2.5 MG/3ML) 0.083% nebulizer solution Take 3 mLs (2.5 mg total) by nebulization every 6 (six) hours as needed for wheezing or shortness of breath. 75 mL 12   amoxicillin-clavulanate (AUGMENTIN) 500-125 MG tablet Take 1 tablet (500 mg total) by mouth 3 (three) times daily. 84 tablet 0   diltiazem (CARDIZEM CD) 180 MG 24 hr capsule Take 180 mg by mouth daily.     losartan (COZAAR) 25 MG tablet Take 25 mg by mouth daily.     oxycodone (ROXICODONE) 30 MG immediate release tablet Take 15-30 mg by mouth every 4 (four) hours as needed for pain.      TRELEGY ELLIPTA 100-62.5-25 MCG/INH AEPB Inhale 1 puff into the lungs daily.     No facility-administered medications prior to visit.        Objective:   Physical Exam:  General appearance: 71 y.o., female, NAD, conversant, chroincally ill appearing Eyes: anicteric sclerae, moist conjunctivae; no lid-lag; PERRL, tracking appropriately HENT: NCAT; oropharynx, MMM, no mucosal ulcerations; normal hard and soft palate, poor dentition Neck: Trachea midline; no lymphadenopathy, no JVD Lungs: diminished b/l, no crackles, no wheeze, with normal respiratory effort CV: RRR, no MRGs  Abdomen: Soft, non-tender; non-distended, BS present  Extremities: No peripheral edema, radial and DP pulses present bilaterally  Skin: Normal temperature, turgor and texture; no rash Psych: Appropriate affect Neuro: Alert and oriented to person and place, no focal deficit    There were no vitals filed for this visit.    on RA BMI Readings from Last 3 Encounters:  04/03/22 20.53 kg/m  03/05/22 20.05 kg/m  04/07/21 21.63 kg/m   Wt Readings from Last 3 Encounters:  04/03/22 119 lb 9.6 oz (54.3 kg)  03/05/22 116 lb 12.8 oz (53 kg)  04/07/21 126 lb (57.2 kg)     CBC    Component Value Date/Time   WBC 7.5 03/04/2022 0310   RBC 3.63 (L) 03/04/2022 0310   HGB 12.1 03/04/2022 0310   HCT 37.0 03/04/2022 0310   PLT 292 03/04/2022 0310   MCV 101.9 (H) 03/04/2022 0310   MCH 33.3 03/04/2022 0310   MCHC 32.7 03/04/2022 0310   RDW 15.9 (H) 03/04/2022 0310   LYMPHSABS 1.6 03/02/2022 1800   MONOABS 0.8 03/02/2022 1800   EOSABS 0.5 03/02/2022 1800   BASOSABS 0.1 03/02/2022 1800    Chest Imaging:  CTA 10/2016 reviewed by me and remarkable for significant TIB nodularity esp RML, RLL, smaller RLLL cavitary lesion  CT Chest 04/01/21 reviewed by me and remarkable for  Interval decrease in size of RML nodule relative to CTA Chest 01/2021  CT Chest 02/28/22 reviewed by with interval growth/development of multiple cavitary nodules   Pulmonary Functions Testing Results:     No data to display              Assessment & Plan:   #  Pulmonary nodules:  Appearance and waxing/waning size with background of TIB overall suggestive of cavitary NTM disease. While size of dominant nodule is smaller from 08/2020 to 03/2021 and may be related to something like NTM, appearance with her smoking history is still concerning to me. We talked about risks and benefits of proceeding with invasive workup vs surveillance with CT Chest and we will tentatively proceed with CT CHest in 6 months (given prior stability over 7  months), declines invasive workup at present despite risk that there could be cancer progression if nodule instead cancerous. She also declines bronch/BAL for NTM disease.   # Likely COPD  # Smoking  Plan: - CT Chest in 6 months - AFB sputum, sputum culture - flutter valve 10 slow but firm puffs daily after trelegy - continue trelegy - declines PFTs - smoking cessation - she will try on her own - not sure if she makes the best lung cancer screening candidate with her background of likely NTM which will confound the picture frequently         Maryjane Hurter, MD [facility] 04/11/2022 4:49 PM

## 2022-04-12 ENCOUNTER — Ambulatory Visit: Payer: Medicare Other | Admitting: Student

## 2022-04-17 DIAGNOSIS — R000 Tachycardia, unspecified: Secondary | ICD-10-CM | POA: Diagnosis not present

## 2022-04-17 DIAGNOSIS — J449 Chronic obstructive pulmonary disease, unspecified: Secondary | ICD-10-CM | POA: Diagnosis not present

## 2022-04-17 DIAGNOSIS — S51819A Laceration without foreign body of unspecified forearm, initial encounter: Secondary | ICD-10-CM | POA: Diagnosis not present

## 2022-04-17 DIAGNOSIS — I10 Essential (primary) hypertension: Secondary | ICD-10-CM | POA: Diagnosis not present

## 2022-04-17 DIAGNOSIS — J984 Other disorders of lung: Secondary | ICD-10-CM | POA: Diagnosis not present

## 2022-04-17 DIAGNOSIS — R911 Solitary pulmonary nodule: Secondary | ICD-10-CM | POA: Diagnosis not present

## 2022-04-17 DIAGNOSIS — I76 Septic arterial embolism: Secondary | ICD-10-CM | POA: Diagnosis not present

## 2022-04-17 DIAGNOSIS — E559 Vitamin D deficiency, unspecified: Secondary | ICD-10-CM | POA: Diagnosis not present

## 2022-04-17 DIAGNOSIS — R002 Palpitations: Secondary | ICD-10-CM | POA: Diagnosis not present

## 2022-04-17 DIAGNOSIS — T148XXA Other injury of unspecified body region, initial encounter: Secondary | ICD-10-CM | POA: Diagnosis not present

## 2022-04-17 DIAGNOSIS — J441 Chronic obstructive pulmonary disease with (acute) exacerbation: Secondary | ICD-10-CM | POA: Diagnosis not present

## 2022-04-18 DIAGNOSIS — E1142 Type 2 diabetes mellitus with diabetic polyneuropathy: Secondary | ICD-10-CM | POA: Diagnosis not present

## 2022-04-18 DIAGNOSIS — Z6820 Body mass index (BMI) 20.0-20.9, adult: Secondary | ICD-10-CM | POA: Diagnosis not present

## 2022-04-18 DIAGNOSIS — M539 Dorsopathy, unspecified: Secondary | ICD-10-CM | POA: Diagnosis not present

## 2022-04-18 DIAGNOSIS — G8929 Other chronic pain: Secondary | ICD-10-CM | POA: Diagnosis not present

## 2022-04-18 DIAGNOSIS — M545 Low back pain, unspecified: Secondary | ICD-10-CM | POA: Diagnosis not present

## 2022-04-18 DIAGNOSIS — J449 Chronic obstructive pulmonary disease, unspecified: Secondary | ICD-10-CM | POA: Diagnosis not present

## 2022-04-18 DIAGNOSIS — Z79899 Other long term (current) drug therapy: Secondary | ICD-10-CM | POA: Diagnosis not present

## 2022-04-18 DIAGNOSIS — R030 Elevated blood-pressure reading, without diagnosis of hypertension: Secondary | ICD-10-CM | POA: Diagnosis not present

## 2022-04-18 DIAGNOSIS — Z9181 History of falling: Secondary | ICD-10-CM | POA: Diagnosis not present

## 2022-04-18 DIAGNOSIS — G894 Chronic pain syndrome: Secondary | ICD-10-CM | POA: Diagnosis not present

## 2022-04-19 DIAGNOSIS — J479 Bronchiectasis, uncomplicated: Secondary | ICD-10-CM | POA: Diagnosis not present

## 2022-04-19 DIAGNOSIS — I700 Atherosclerosis of aorta: Secondary | ICD-10-CM | POA: Diagnosis not present

## 2022-04-19 DIAGNOSIS — R911 Solitary pulmonary nodule: Secondary | ICD-10-CM | POA: Diagnosis not present

## 2022-04-19 DIAGNOSIS — J439 Emphysema, unspecified: Secondary | ICD-10-CM | POA: Diagnosis not present

## 2022-05-18 DIAGNOSIS — E781 Pure hyperglyceridemia: Secondary | ICD-10-CM | POA: Diagnosis not present

## 2022-05-18 DIAGNOSIS — J449 Chronic obstructive pulmonary disease, unspecified: Secondary | ICD-10-CM | POA: Diagnosis not present

## 2022-05-18 DIAGNOSIS — Z1389 Encounter for screening for other disorder: Secondary | ICD-10-CM | POA: Diagnosis not present

## 2022-05-18 DIAGNOSIS — F17210 Nicotine dependence, cigarettes, uncomplicated: Secondary | ICD-10-CM | POA: Diagnosis not present

## 2022-05-18 DIAGNOSIS — Z681 Body mass index (BMI) 19 or less, adult: Secondary | ICD-10-CM | POA: Diagnosis not present

## 2022-05-18 DIAGNOSIS — I10 Essential (primary) hypertension: Secondary | ICD-10-CM | POA: Diagnosis not present

## 2022-05-18 DIAGNOSIS — J984 Other disorders of lung: Secondary | ICD-10-CM | POA: Diagnosis not present

## 2022-05-18 DIAGNOSIS — R000 Tachycardia, unspecified: Secondary | ICD-10-CM | POA: Diagnosis not present

## 2022-05-18 DIAGNOSIS — E559 Vitamin D deficiency, unspecified: Secondary | ICD-10-CM | POA: Diagnosis not present

## 2022-05-18 DIAGNOSIS — R911 Solitary pulmonary nodule: Secondary | ICD-10-CM | POA: Diagnosis not present

## 2022-05-18 DIAGNOSIS — R002 Palpitations: Secondary | ICD-10-CM | POA: Diagnosis not present

## 2022-05-24 ENCOUNTER — Telehealth: Payer: Self-pay | Admitting: Pulmonary Disease

## 2022-05-24 NOTE — Telephone Encounter (Signed)
Dr. Francine Graven contact Dr. Pernell Dupre on his cell # [phone] regarding this PT last seen on 8/19.

## 2022-05-24 NOTE — Telephone Encounter (Signed)
Spoke with Dr. Pernell Dupre.

## 2022-05-25 DIAGNOSIS — M539 Dorsopathy, unspecified: Secondary | ICD-10-CM | POA: Diagnosis not present

## 2022-05-25 DIAGNOSIS — Z6820 Body mass index (BMI) 20.0-20.9, adult: Secondary | ICD-10-CM | POA: Diagnosis not present

## 2022-05-25 DIAGNOSIS — M545 Low back pain, unspecified: Secondary | ICD-10-CM | POA: Diagnosis not present

## 2022-05-25 DIAGNOSIS — R030 Elevated blood-pressure reading, without diagnosis of hypertension: Secondary | ICD-10-CM | POA: Diagnosis not present

## 2022-05-25 DIAGNOSIS — G8929 Other chronic pain: Secondary | ICD-10-CM | POA: Diagnosis not present

## 2022-05-25 DIAGNOSIS — Z79899 Other long term (current) drug therapy: Secondary | ICD-10-CM | POA: Diagnosis not present

## 2022-05-25 DIAGNOSIS — Z9181 History of falling: Secondary | ICD-10-CM | POA: Diagnosis not present

## 2022-05-25 DIAGNOSIS — E1142 Type 2 diabetes mellitus with diabetic polyneuropathy: Secondary | ICD-10-CM | POA: Diagnosis not present

## 2022-05-25 DIAGNOSIS — J449 Chronic obstructive pulmonary disease, unspecified: Secondary | ICD-10-CM | POA: Diagnosis not present

## 2022-05-25 DIAGNOSIS — G894 Chronic pain syndrome: Secondary | ICD-10-CM | POA: Diagnosis not present

## 2022-06-05 NOTE — Progress Notes (Unsigned)
Synopsis: Referred for pulmonary nodule by Donetta Potts, MD  Subjective:   PATIENT ID: Teresa Edwards GENDER: female DOB: 10-31-1950, MRN: [id number]  No chief complaint on file.  CC: dyspnea on exertion  71yM with COPD referred for pulmonary nodules (dominant 1.6cm RML decreased from  2.75mm on 04/01/21 scan)  She smoked 50 years (~50 py smoking) active, down to 1 cigarette per day. She does have quite a bit of chest congestion. Finished course of antibiotics on Monday and that has helped quite a bit. She has no trouble swallowing, no aspiration. She has had no fever, she has had no weight loss, no night sweats drenching the sheets.   She has DOE to 50 yards. She is on trelegy. She has never done pulmonary rehab.   She has never had a bronchoscopy or TTNA.    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 She has no pets at home. No pet bird. No hot tub  Interval HPI: waxing/waning cavitary pulm nodules sent to ED in August for progressive DOE and CT findings that initially raised concern for septic embolization  RUL apical lesion does look a bit more solid/larger than in August  She is doing 'ok I guess.'   She has no cough really, just at night sometimes.   Still smoking but if she buys a pack, her grandson will steal them all. She does have some DOE when she's walking.   Otherwise pertinent review of systems is negative.  Past Medical History:  Diagnosis Date   Aortic atherosclerosis (HCC)    Cavitary lung disease    Chronic pain    COPD (chronic obstructive pulmonary disease) (HCC)    Coronary artery calcification seen on CT scan    DDD (degenerative disc disease)    DVT femoral (deep venous thrombosis) with thrombophlebitis (HCC)      Family History  Problem Relation Age of Onset   Deep vein thrombosis Mother     Other Mother        varicose veins   Hypertension Sister    Other Sister        varicose veins   Cancer Brother      Past Surgical History:  Procedure Laterality Date   TOTAL HIP ARTHROPLASTY Right    TUBAL LIGATION  10/1971    Social History   Socioeconomic History   Marital status: Widowed    Spouse name: Not on file   Number of children: 2   Years of education: 57   Highest education level: Not on file  Occupational History   Occupation: homemaker  Tobacco Use   Smoking status: Every Day    Packs/day: 0.50    Years: 40.00    Total pack years: 20.00    Types: Cigarettes   Smokeless tobacco: Never   Tobacco comments:    Patient is down to 1 cig a day for 1 week. HSM.   Vaping Use   Vaping Use: Never used  Substance and Sexual Activity   Alcohol use: No    Alcohol/week: 0.0 standard drinks of alcohol   Drug use: No   Sexual activity: Not on file  Other Topics Concern   Not on file  Social History Narrative   Not on file   Social Determinants of Health   Financial Resource Strain: Not on file  Food Insecurity: Not on file  Transportation Needs: Not on file  Physical Activity: Not on file  Stress: Not on file  Social Connections: Not on file  Intimate Partner Violence: Not on file     No Known Allergies   Outpatient Medications Prior to Visit  Medication Sig Dispense Refill   albuterol (PROVENTIL HFA;VENTOLIN HFA) 108 (90 Base) MCG/ACT inhaler Inhale 2 puffs into the lungs every 6 (six) hours as needed for wheezing or shortness of breath. 1 Inhaler 2   albuterol (PROVENTIL) (2.5 MG/3ML) 0.083% nebulizer solution Take 3 mLs (2.5 mg total) by nebulization every 6 (six) hours as needed for wheezing or shortness of breath. 75 mL 12   amoxicillin-clavulanate (AUGMENTIN) 500-125 MG tablet Take 1 tablet (500 mg total) by mouth 3 (three) times daily. 84 tablet 0   diltiazem (CARDIZEM CD) 180 MG 24 hr capsule Take 180 mg by mouth daily.     losartan (COZAAR) 25 MG  tablet Take 25 mg by mouth daily.     oxycodone (ROXICODONE) 30 MG immediate release tablet Take 15-30 mg by mouth every 4 (four) hours as needed for pain.      TRELEGY ELLIPTA 100-62.5-25 MCG/INH AEPB Inhale 1 puff into the lungs daily.     No facility-administered medications prior to visit.       Objective:   Physical Exam:  General appearance: 71 y.o., female, NAD, female, NAD, conversant, chroincally ill appearing Eyes: anicteric sclerae, moist conjunctivae; no lid-lag; PERRL, tracking appropriately HENT: NCAT; oropharynx, MMM, no mucosal ulcerations; normal hard and soft palate, poor dentition Neck: Trachea midline; no lymphadenopathy, no JVD Lungs: diminished b/l, no crackles, no wheeze, with normal respiratory effort CV: RRR, no MRGs  Abdomen: Soft, non-tender; non-distended, BS present  Extremities: No peripheral edema, radial and DP pulses present bilaterally  Skin: Normal temperature, turgor and texture; no rash Psych: Appropriate affect Neuro: Alert and oriented to person and place, no focal deficit    There were no vitals filed for this visit.    on RA BMI Readings from Last 3 Encounters:  04/03/22 20.53 kg/m  03/05/22 20.05 kg/m  04/07/21 21.63 kg/m   Wt Readings from Last 3 Encounters:  04/03/22 119 lb 9.6 oz (54.3 kg)  03/05/22 116 lb 12.8 oz (53 kg)  04/07/21 126 lb (57.2 kg)     CBC    Component Value Date/Time   WBC 7.5 03/04/2022 0310   RBC 3.63 (L) 03/04/2022 0310   HGB 12.1 03/04/2022 0310   HCT 37.0 03/04/2022 0310   PLT 292 03/04/2022 0310   MCV 101.9 (H) 03/04/2022 0310   MCH 33.3 03/04/2022 0310   MCHC 32.7 03/04/2022 0310   RDW 15.9 (H) 03/04/2022 0310   LYMPHSABS 1.6 03/02/2022 1800   MONOABS 0.8 03/02/2022 1800   EOSABS 0.5 03/02/2022 1800   BASOSABS 0.1 03/02/2022 1800   Quant gold negative, ANCAs neg, AFB sputum 9/22 neg  Chest Imaging:  CTA 10/2016 reviewed by me and remarkable for significant TIB nodularity esp RML, RLL, smaller  RLLL cavitary lesion  CT Chest 04/01/21 reviewed by me and remarkable for  Interval decrease in size of RML nodule relative to CTA Chest 01/2021  CTA Chest 03/02/22 reviewed by me with multiple stable cavitary nodules  CT Chest 04/19/22 reviewed by me - RUL apical lesion does look a bit larger/more solid than prior but agree otherwise remarkable for waxing/waning pulm nodules  Pulmonary Functions Testing Results:     No data to display  Assessment & Plan:   # Pulmonary nodules:  Appearance and waxing/waning size with background of TIB overall suggestive of cavitary NTM disease or fungal infection. Didn't change much after 1 month course of augmentin.   # Likely COPD  # Smoking  Plan: - Recommended nav bronch with attention to apical RUL lesion especially but would try to sample the other cavitary nodules as well. Risks of procedure discussed, risks of watchful waiting reviewed. She will think about it and for now I've at least ordered 3 month follow up and clinic visit afterward at a minimum.  - flutter valve 10 slow but firm puffs daily after trelegy - trial increase to trelegy 200 - declined PFTs last visit - smoking cessation - she will try on her own       RTC January after CT Chest unless elects to pursue nav bronch now  Omar Person, MD [facility] 06/05/2022 2:49 PM

## 2022-06-05 NOTE — H&P (View-Only) (Signed)
Synopsis: Referred for pulmonary nodule by Donetta Potts, MD  Subjective:   PATIENT ID: Teresa Edwards GENDER: female DOB: April 05, 1951, MRN: [id number]  No chief complaint on file.  CC: dyspnea on exertion  71yM with COPD referred for pulmonary nodules (dominant 1.6cm RML decreased from  2.48mm on 04/01/21 scan)  She smoked 50 years (~50 py smoking) active, down to 1 cigarette per day. She does have quite a bit of chest congestion. Finished course of antibiotics on Monday and that has helped quite a bit. She has no trouble swallowing, no aspiration. She has had no fever, she has had no weight loss, no night sweats drenching the sheets.   She has DOE to 50 yards. She is on trelegy. She has never done pulmonary rehab.   She has never had a bronchoscopy or TTNA.    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 She has no pets at home. No pet bird. No hot tub  Interval HPI: waxing/waning cavitary pulm nodules sent to ED in August for progressive DOE and CT findings that initially raised concern for septic embolization  RUL apical lesion does look a bit more solid/larger than in August  She is doing 'ok I guess.'   She has no cough really, just at night sometimes.   Still smoking but if she buys a pack, her grandson will steal them all. She does have some DOE when she's walking.   Otherwise pertinent review of systems is negative.  Past Medical History:  Diagnosis Date   Aortic atherosclerosis (HCC)    Cavitary lung disease    Chronic pain    COPD (chronic obstructive pulmonary disease) (HCC)    Coronary artery calcification seen on CT scan    DDD (degenerative disc disease)    DVT femoral (deep venous thrombosis) with thrombophlebitis (HCC)      Family History  Problem Relation Age of Onset   Deep vein thrombosis Mother     Other Mother        varicose veins   Hypertension Sister    Other Sister        varicose veins   Cancer Brother      Past Surgical History:  Procedure Laterality Date   TOTAL HIP ARTHROPLASTY Right    TUBAL LIGATION  10/1971    Social History   Socioeconomic History   Marital status: Widowed    Spouse name: Not on file   Number of children: 2   Years of education: 51   Highest education level: Not on file  Occupational History   Occupation: homemaker  Tobacco Use   Smoking status: Every Day    Packs/day: 0.50    Years: 40.00    Total pack years: 20.00    Types: Cigarettes   Smokeless tobacco: Never   Tobacco comments:    Patient is down to 1 cig a day for 1 week. HSM.   Vaping Use   Vaping Use: Never used  Substance and Sexual Activity   Alcohol use: No    Alcohol/week: 0.0 standard drinks of alcohol   Drug use: No   Sexual activity: Not on file  Other Topics Concern   Not on file  Social History Narrative   Not on file   Social Determinants of Health   Financial Resource Strain: Not on file  Food Insecurity: Not on file  Transportation Needs: Not on file  Physical Activity: Not on file  Stress: Not on file  Social Connections: Not on file  Intimate Partner Violence: Not on file     No Known Allergies   Outpatient Medications Prior to Visit  Medication Sig Dispense Refill   albuterol (PROVENTIL HFA;VENTOLIN HFA) 108 (90 Base) MCG/ACT inhaler Inhale 2 puffs into the lungs every 6 (six) hours as needed for wheezing or shortness of breath. 1 Inhaler 2   albuterol (PROVENTIL) (2.5 MG/3ML) 0.083% nebulizer solution Take 3 mLs (2.5 mg total) by nebulization every 6 (six) hours as needed for wheezing or shortness of breath. 75 mL 12   amoxicillin-clavulanate (AUGMENTIN) 500-125 MG tablet Take 1 tablet (500 mg total) by mouth 3 (three) times daily. 84 tablet 0   diltiazem (CARDIZEM CD) 180 MG 24 hr capsule Take 180 mg by mouth daily.     losartan (COZAAR) 25 MG  tablet Take 25 mg by mouth daily.     oxycodone (ROXICODONE) 30 MG immediate release tablet Take 15-30 mg by mouth every 4 (four) hours as needed for pain.      TRELEGY ELLIPTA 100-62.5-25 MCG/INH AEPB Inhale 1 puff into the lungs daily.     No facility-administered medications prior to visit.       Objective:   Physical Exam:  General appearance: 71 y.o., female, NAD, conversant, chroincally ill appearing Eyes: anicteric sclerae, moist conjunctivae; no lid-lag; PERRL, tracking appropriately HENT: NCAT; oropharynx, MMM, no mucosal ulcerations; normal hard and soft palate, poor dentition Neck: Trachea midline; no lymphadenopathy, no JVD Lungs: diminished b/l, no crackles, no wheeze, with normal respiratory effort CV: RRR, no MRGs  Abdomen: Soft, non-tender; non-distended, BS present  Extremities: No peripheral edema, radial and DP pulses present bilaterally  Skin: Normal temperature, turgor and texture; no rash Psych: Appropriate affect Neuro: Alert and oriented to person and place, no focal deficit    There were no vitals filed for this visit.    on RA BMI Readings from Last 3 Encounters:  04/03/22 20.53 kg/m  03/05/22 20.05 kg/m  04/07/21 21.63 kg/m   Wt Readings from Last 3 Encounters:  04/03/22 119 lb 9.6 oz (54.3 kg)  03/05/22 116 lb 12.8 oz (53 kg)  04/07/21 126 lb (57.2 kg)     CBC    Component Value Date/Time   WBC 7.5 03/04/2022 0310   RBC 3.63 (L) 03/04/2022 0310   HGB 12.1 03/04/2022 0310   HCT 37.0 03/04/2022 0310   PLT 292 03/04/2022 0310   MCV 101.9 (H) 03/04/2022 0310   MCH 33.3 03/04/2022 0310   MCHC 32.7 03/04/2022 0310   RDW 15.9 (H) 03/04/2022 0310   LYMPHSABS 1.6 03/02/2022 1800   MONOABS 0.8 03/02/2022 1800   EOSABS 0.5 03/02/2022 1800   BASOSABS 0.1 03/02/2022 1800   Quant gold negative, ANCAs neg, AFB sputum 9/22 neg  Chest Imaging:  CTA 10/2016 reviewed by me and remarkable for significant TIB nodularity esp RML, RLL, smaller  RLLL cavitary lesion  CT Chest 04/01/21 reviewed by me and remarkable for  Interval decrease in size of RML nodule relative to CTA Chest 01/2021  CTA Chest 03/02/22 reviewed by me with multiple stable cavitary nodules  CT Chest 04/19/22 reviewed by me - RUL apical lesion does look a bit larger/more solid than prior but agree otherwise remarkable for waxing/waning pulm nodules  Pulmonary Functions Testing Results:     No data to display  Assessment & Plan:   # Pulmonary nodules:  Appearance and waxing/waning size with background of TIB overall suggestive of cavitary NTM disease or fungal infection. Didn't change much after 1 month course of augmentin.   # Likely COPD  # Smoking  Plan: - Recommended nav bronch with attention to apical RUL lesion especially but would try to sample the other cavitary nodules as well. Risks of procedure discussed, risks of watchful waiting reviewed. She will think about it and for now I've at least ordered 3 month follow up and clinic visit afterward at a minimum.  - flutter valve 10 slow but firm puffs daily after trelegy - trial increase to trelegy 200 - declined PFTs last visit - smoking cessation - she will try on her own       RTC January after CT Chest unless elects to pursue nav bronch now  Omar Person, MD [facility] 06/05/2022 2:49 PM

## 2022-06-07 ENCOUNTER — Ambulatory Visit (INDEPENDENT_AMBULATORY_CARE_PROVIDER_SITE_OTHER): Payer: Medicare Other | Admitting: Student

## 2022-06-07 ENCOUNTER — Encounter: Payer: Self-pay | Admitting: Student

## 2022-06-07 VITALS — BP 134/82 | HR 84 | Temp 97.9°F | Ht 64.0 in | Wt 116.4 lb

## 2022-06-07 DIAGNOSIS — R918 Other nonspecific abnormal finding of lung field: Secondary | ICD-10-CM

## 2022-06-07 DIAGNOSIS — R053 Chronic cough: Secondary | ICD-10-CM | POA: Diagnosis not present

## 2022-06-07 MED ORDER — TRELEGY ELLIPTA 200-62.5-25 MCG/ACT IN AEPB: 1.0000 | INHALATION_SPRAY | Freq: Every day | RESPIRATORY_TRACT | 0 refills | Status: DC

## 2022-06-07 NOTE — Patient Instructions (Addendum)
-   I would recommend navigational bronchoscopy to figure out what these spots are on your lung. I'm most concerned about one in your right upper lobe.  - ct chest in 3 months  - try trelegy 1 puff once daily higher dose, rinse mouth after use

## 2022-06-12 ENCOUNTER — Telehealth: Payer: Self-pay | Admitting: Student

## 2022-06-12 DIAGNOSIS — R918 Other nonspecific abnormal finding of lung field: Secondary | ICD-10-CM

## 2022-06-12 NOTE — Telephone Encounter (Signed)
Called Clydie Braun but she did not answer. Left message for her to call us back.

## 2022-06-12 NOTE — Telephone Encounter (Signed)
I have pt scheduled for bronch on 12/7 - slot already taken on 12/1.  I called daughter Clydie Braun and she is a Runner, broadcasting/film/video and states she would prefer it if this can be scheduled when she is off work for Christmas if possible.  Her last day will be 12/19 and she goes back on 1/3.  She said if can't be rescheduled she will take time off work.  Dr Thora Lance is there alternate dates you would like me to schedule?

## 2022-06-12 NOTE — Telephone Encounter (Signed)
Orders placed for bronch, Super D CT

## 2022-06-12 NOTE — Telephone Encounter (Signed)
Teresa Edwards called back. She stated that her mom has changed her mind about the bronch. She wishes to proceed with the procedure. I explained to her we will have to send a message to Dr. Thora Lance to find dates he is available and then go from there. She verbalized understanding. She has requested that we call her back on her phone [phone] instead of [place] calling the patient.   Dr. Thora Lance, can you please advise? Thanks!

## 2022-06-13 ENCOUNTER — Encounter: Payer: Self-pay | Admitting: Student

## 2022-06-13 NOTE — Telephone Encounter (Signed)
12/28 if available - if not then 12/29  Thanks for the heads up!

## 2022-06-13 NOTE — Telephone Encounter (Signed)
Dr Thora Lance - I gave new appt info to dtr for 12/29.  She just called me back & states her mom does not want to wait until 12/29 and she is asking if we can move it back to 12/7.  Is this fine with you?

## 2022-06-13 NOTE — Telephone Encounter (Signed)
Either date is fine with me! 12/7 or 12/29 whatever Teresa Edwards and her family prefer. Thanks for the help either way!

## 2022-06-13 NOTE — Telephone Encounter (Signed)
Pt has been rescheduled to 12/29 and appt info given to dtr.  Nothing further needed on this message.

## 2022-06-14 DIAGNOSIS — N6325 Unspecified lump in the left breast, overlapping quadrants: Secondary | ICD-10-CM | POA: Diagnosis not present

## 2022-06-14 DIAGNOSIS — R92322 Mammographic fibroglandular density, left breast: Secondary | ICD-10-CM | POA: Diagnosis not present

## 2022-06-14 NOTE — Telephone Encounter (Signed)
Bronch has been rescheduled to 12/7 at 7:30.  Spoke to dtr and gave her updated appt info.

## 2022-06-19 ENCOUNTER — Ambulatory Visit (HOSPITAL_COMMUNITY)
Admission: RE | Admit: 2022-06-19 | Discharge: 2022-06-19 | Disposition: A | Discharge status: Home / Self Care | Payer: Medicare Other | Source: Ambulatory Visit | Attending: Student | Admitting: Student

## 2022-06-19 ENCOUNTER — Ambulatory Visit (HOSPITAL_COMMUNITY): Payer: Medicare Other

## 2022-06-19 DIAGNOSIS — J449 Chronic obstructive pulmonary disease, unspecified: Secondary | ICD-10-CM | POA: Diagnosis not present

## 2022-06-19 DIAGNOSIS — Z6820 Body mass index (BMI) 20.0-20.9, adult: Secondary | ICD-10-CM | POA: Diagnosis not present

## 2022-06-19 DIAGNOSIS — R918 Other nonspecific abnormal finding of lung field: Secondary | ICD-10-CM | POA: Diagnosis not present

## 2022-06-19 DIAGNOSIS — Z9181 History of falling: Secondary | ICD-10-CM | POA: Diagnosis not present

## 2022-06-19 DIAGNOSIS — M545 Low back pain, unspecified: Secondary | ICD-10-CM | POA: Diagnosis not present

## 2022-06-19 DIAGNOSIS — G894 Chronic pain syndrome: Secondary | ICD-10-CM | POA: Diagnosis not present

## 2022-06-19 DIAGNOSIS — Z79899 Other long term (current) drug therapy: Secondary | ICD-10-CM | POA: Diagnosis not present

## 2022-06-19 DIAGNOSIS — E1142 Type 2 diabetes mellitus with diabetic polyneuropathy: Secondary | ICD-10-CM | POA: Diagnosis not present

## 2022-06-19 DIAGNOSIS — G8929 Other chronic pain: Secondary | ICD-10-CM | POA: Diagnosis not present

## 2022-06-19 DIAGNOSIS — R030 Elevated blood-pressure reading, without diagnosis of hypertension: Secondary | ICD-10-CM | POA: Diagnosis not present

## 2022-06-19 DIAGNOSIS — M539 Dorsopathy, unspecified: Secondary | ICD-10-CM | POA: Diagnosis not present

## 2022-06-19 DIAGNOSIS — J439 Emphysema, unspecified: Secondary | ICD-10-CM | POA: Diagnosis not present

## 2022-06-21 ENCOUNTER — Encounter (HOSPITAL_COMMUNITY): Payer: Self-pay | Admitting: Student

## 2022-06-21 ENCOUNTER — Other Ambulatory Visit: Payer: Self-pay

## 2022-06-21 ENCOUNTER — Telehealth: Payer: Self-pay | Admitting: Student

## 2022-06-21 NOTE — Progress Notes (Signed)
PCP - Dr. Mayford Knife[facility]  Cardiologist - Denies  EP- Denies  Endocrine- Denies  Pulm- Denies  Chest x-ray - 06/22/22- Day of Surgery  EKG - 03/02/22 (E)  Stress Test - Denies  ECHO - 03/03/22 (E)  Cardiac Cath - Denies  AICD-na PM-na LOOP-na  Nerve Stimulator- Denies  Dialysis- Denies  Sleep Study - Denies CPAP - Denies  LABS- 06/22/22: CBC, BMP, COVID  ASA- Denies  ERAS- No  HA1C- Denies  Anesthesia- No  Pt denies having chest pain, sob, or fever during the pre-op phone call. All instructions explained to the pt, with a verbal understanding of the material. Pt also instructed to wear a mask and social distance if she goes out. The opportunity to ask questions was provided.

## 2022-06-21 NOTE — Telephone Encounter (Signed)
Spoke with pt who was wanting to know instructions for bronch tomorrow by Dr. Thora Lance. RN reviewed instructions with pt who stated understanding. Nothing further needed at this time.

## 2022-06-21 NOTE — Progress Notes (Signed)
S.D.W- Instructions   Your procedure is scheduled on Thurs., Dec. 7, 2023 from 8:15AM-9:45AM .  Report to [facility] Main Entrance "A" at 5:30 A.M., then check in with the Admitting office.  Call this number if you have problems the morning of surgery:  [phone]             If you experience any cold or flu symptoms such as cough, fever, chills, shortness of breath, etc. between now and your scheduled surgery, please notify us at the above         number.  Remember:  Do not eat or drink after midnight on Dec. 6th    Take these medicines the morning of surgery with A SIP OF WATER:  Diltiazem (CARDIZEM CD)  Fluticasone-Umeclidin-Vilant (TRELEGY ELLIPTA)   If Needed: Albuterol (PROVENTIL HFA;VENTOLIN HFA)  Oxycodone (ROXICODONE)   As of today, STOP taking any Aspirin (unless otherwise instructed by your surgeon) Aleve, Naproxen, Ibuprofen, Motrin, Advil, Goody's, BC's, all herbal medications, fish oil, and all vitamins.          Do not wear jewelry or makeup. Do not wear lotions, powders, perfumes or deodorant. Do not shave 48 hours prior to surgery.   Do not bring valuables to the hospital. Do not wear nail polish, gel polish, artificial nails, or any other type of covering on natural nails (fingers and toes) If you have artificial nails or gel coating that need to be removed by a nail salon, please have this removed prior to surgery. Artificial nails or gel coating may interfere with anesthesia's ability to adequately monitor your vital signs.  [facility] is not responsible for any belongings or valuables.    Do NOT Smoke (Tobacco/Vaping)  24 hours prior to your procedure  If you use a CPAP at night, you may bring your mask for your overnight stay.   Contacts, glasses, hearing aids, dentures or partials may not be worn into surgery, please bring cases for these belongings   For patients admitted to the hospital, discharge time will be determined by your treatment team.    Patients discharged the day of surgery will not be allowed to drive home, and someone needs to stay with them for 24 hours.  Special instructions:    Oral Hygiene is also important to reduce your risk of infection.  Remember - BRUSH YOUR TEETH THE MORNING OF SURGERY WITH YOUR REGULAR TOOTHPASTE  [facility]- Preparing For Surgery  Before surgery, you can play an important role. Because skin is not sterile, your skin needs to be as free of germs as possible. You can reduce the number of germs on your skin by washing with Antibacterial Soap before surgery.     Please follow these instructions carefully.     Shower the NIGHT BEFORE SURGERY and the MORNING OF SURGERY with Antibacterial Soap.   Pat yourself dry with a CLEAN TOWEL.  Wear CLEAN PAJAMAS to bed the night before surgery  Place CLEAN SHEETS on your bed the night before your surgery  DO NOT SLEEP WITH PETS.  Day of Surgery:  Take a shower with Antibacterial soap. Wear Clean/Comfortable clothing the morning of surgery Do not apply any deodorants/lotions.   Remember to brush your teeth WITH YOUR REGULAR TOOTHPASTE.   If you test positive for Covid, or been in contact with anyone that has tested positive in the last 10 days, please notify your surgeon.  SURGICAL WAITING ROOM VISITATION Patients having surgery or a procedure may have no more  than 2 support people in the waiting area - these visitors may rotate.   Children under the age of 31 must have an adult with them who is not the patient. If the patient needs to stay at the hospital during part of their recovery, the visitor guidelines for inpatient rooms apply. Pre-op nurse will coordinate an appropriate time for 1 support person to accompany patient in pre-op.  This support person may not rotate.   Please refer to the [facility] website for the visitor guidelines for Inpatients (after your surgery is over and you are in a regular room).

## 2022-06-22 ENCOUNTER — Ambulatory Visit (HOSPITAL_COMMUNITY): Payer: Medicare Other

## 2022-06-22 ENCOUNTER — Encounter (HOSPITAL_COMMUNITY)
Admission: RE | Disposition: A | Discharge status: Home / Self Care | Payer: Self-pay | Source: Home / Self Care | Attending: Student

## 2022-06-22 ENCOUNTER — Ambulatory Visit (HOSPITAL_BASED_OUTPATIENT_CLINIC_OR_DEPARTMENT_OTHER): Payer: Medicare Other | Admitting: Anesthesiology

## 2022-06-22 ENCOUNTER — Ambulatory Visit (HOSPITAL_COMMUNITY): Payer: Medicare Other | Admitting: Anesthesiology

## 2022-06-22 ENCOUNTER — Ambulatory Visit (HOSPITAL_COMMUNITY)
Admission: RE | Admit: 2022-06-22 | Discharge: 2022-06-22 | Disposition: A | Discharge status: Home / Self Care | Payer: Medicare Other | Attending: Student | Admitting: Student

## 2022-06-22 ENCOUNTER — Encounter (HOSPITAL_COMMUNITY): Payer: Self-pay | Admitting: Student

## 2022-06-22 DIAGNOSIS — R911 Solitary pulmonary nodule: Secondary | ICD-10-CM | POA: Diagnosis not present

## 2022-06-22 DIAGNOSIS — F17200 Nicotine dependence, unspecified, uncomplicated: Secondary | ICD-10-CM | POA: Diagnosis not present

## 2022-06-22 DIAGNOSIS — J449 Chronic obstructive pulmonary disease, unspecified: Secondary | ICD-10-CM | POA: Insufficient documentation

## 2022-06-22 DIAGNOSIS — Z1152 Encounter for screening for COVID-19: Secondary | ICD-10-CM | POA: Diagnosis not present

## 2022-06-22 DIAGNOSIS — B49 Unspecified mycosis: Secondary | ICD-10-CM | POA: Diagnosis not present

## 2022-06-22 DIAGNOSIS — R918 Other nonspecific abnormal finding of lung field: Secondary | ICD-10-CM

## 2022-06-22 DIAGNOSIS — F17210 Nicotine dependence, cigarettes, uncomplicated: Secondary | ICD-10-CM | POA: Insufficient documentation

## 2022-06-22 DIAGNOSIS — Z79899 Other long term (current) drug therapy: Secondary | ICD-10-CM | POA: Insufficient documentation

## 2022-06-22 DIAGNOSIS — I2510 Atherosclerotic heart disease of native coronary artery without angina pectoris: Secondary | ICD-10-CM

## 2022-06-22 DIAGNOSIS — I10 Essential (primary) hypertension: Secondary | ICD-10-CM

## 2022-06-22 DIAGNOSIS — Z86718 Personal history of other venous thrombosis and embolism: Secondary | ICD-10-CM | POA: Diagnosis not present

## 2022-06-22 DIAGNOSIS — Z01818 Encounter for other preprocedural examination: Secondary | ICD-10-CM

## 2022-06-22 HISTORY — DX: Dyspnea, unspecified: R06.00

## 2022-06-22 HISTORY — DX: Essential (primary) hypertension: I10

## 2022-06-22 HISTORY — PX: BRONCHIAL BIOPSY: SHX5109

## 2022-06-22 HISTORY — PX: BRONCHIAL WASHINGS: SHX5105

## 2022-06-22 HISTORY — PX: BRONCHIAL NEEDLE ASPIRATION BIOPSY: SHX5106

## 2022-06-22 LAB — BASIC METABOLIC PANEL
Anion gap: 8 (ref 5–15)
BUN: 23 mg/dL (ref 8–23)
CO2: 23 mmol/L (ref 22–32)
Calcium: 8.9 mg/dL (ref 8.9–10.3)
Chloride: 109 mmol/L (ref 98–111)
Creatinine, Ser: 0.63 mg/dL (ref 0.44–1.00)
GFR, Estimated: >60 mL/min (ref >60)
Glucose, Bld: 83 mg/dL (ref 70–99)
Potassium: 3.9 mmol/L (ref 3.5–5.1)
Sodium: 140 mmol/L (ref 135–145)

## 2022-06-22 LAB — CBC
HCT: 40.7 % (ref 36.0–46.0)
Hemoglobin: 13 g/dL (ref 12.0–15.0)
MCH: 31.9 pg (ref 26.0–34.0)
MCHC: 31.9 g/dL (ref 30.0–36.0)
MCV: 99.8 fL (ref 80.0–100.0)
Platelets: 291 10*3/uL (ref 150–400)
RBC: 4.08 MIL/uL (ref 3.87–5.11)
RDW: 14.8 % (ref 11.5–15.5)
WBC: 9.1 10*3/uL (ref 4.0–10.5)
nRBC: 0 % (ref 0.0–0.2)

## 2022-06-22 LAB — SARS CORONAVIRUS 2 BY RT PCR: SARS Coronavirus 2 by RT PCR: NEGATIVE

## 2022-06-22 SURGERY — BRONCHOSCOPY, WITH BIOPSY USING ELECTROMAGNETIC NAVIGATION
Anesthesia: General | Laterality: Bilateral

## 2022-06-22 MED ORDER — SUGAMMADEX SODIUM 200 MG/2ML IV SOLN
INTRAVENOUS | Status: DC | PRN
  Administered 2022-06-22: 200 mg via INTRAVENOUS

## 2022-06-22 MED ORDER — LACTATED RINGERS IV SOLN: INTRAVENOUS | Status: DC

## 2022-06-22 MED ORDER — ROCURONIUM BROMIDE 10 MG/ML (PF) SYRINGE
PREFILLED_SYRINGE | INTRAVENOUS | Status: DC | PRN
  Administered 2022-06-22: 50 mg via INTRAVENOUS

## 2022-06-22 MED ORDER — PROPOFOL 10 MG/ML IV BOLUS
INTRAVENOUS | Status: DC | PRN
  Administered 2022-06-22: 100 mg via INTRAVENOUS

## 2022-06-22 MED ORDER — PROMETHAZINE HCL 25 MG/ML IJ SOLN: 6.2500 mg | INTRAMUSCULAR | Status: DC | PRN

## 2022-06-22 MED ORDER — FENTANYL CITRATE (PF) 250 MCG/5ML IJ SOLN
INTRAMUSCULAR | Status: DC | PRN
  Administered 2022-06-22: 100 ug via INTRAVENOUS

## 2022-06-22 MED ORDER — MIDAZOLAM HCL 2 MG/2ML IJ SOLN: 0.5000 mg | Freq: Once | INTRAMUSCULAR | Status: DC | PRN

## 2022-06-22 MED ORDER — MEPERIDINE HCL 25 MG/ML IJ SOLN: 6.2500 mg | INTRAMUSCULAR | Status: DC | PRN

## 2022-06-22 MED ORDER — OXYCODONE HCL 5 MG/5ML PO SOLN: 5.0000 mg | Freq: Once | ORAL | Status: DC | PRN

## 2022-06-22 MED ORDER — PHENYLEPHRINE HCL-NACL 20-0.9 MG/250ML-% IV SOLN
INTRAVENOUS | Status: DC | PRN
  Administered 2022-06-22: 100 ug/min via INTRAVENOUS

## 2022-06-22 MED ORDER — FENTANYL CITRATE (PF) 100 MCG/2ML IJ SOLN: 25.0000 ug | INTRAMUSCULAR | Status: DC | PRN

## 2022-06-22 MED ORDER — DEXAMETHASONE SODIUM PHOSPHATE 10 MG/ML IJ SOLN
INTRAMUSCULAR | Status: DC | PRN
  Administered 2022-06-22: 10 mg via INTRAVENOUS

## 2022-06-22 MED ORDER — CHLORHEXIDINE GLUCONATE 0.12 % MT SOLN
OROMUCOSAL | Status: AC
  Administered 2022-06-22: 15 mL via OROMUCOSAL
  Filled 2022-06-22: qty 15

## 2022-06-22 MED ORDER — ONDANSETRON HCL 4 MG/2ML IJ SOLN
INTRAMUSCULAR | Status: DC | PRN
  Administered 2022-06-22: 4 mg via INTRAVENOUS

## 2022-06-22 MED ORDER — OXYCODONE HCL 5 MG PO TABS: 5.0000 mg | ORAL_TABLET | Freq: Once | ORAL | Status: DC | PRN

## 2022-06-22 MED ORDER — LIDOCAINE 2% (20 MG/ML) 5 ML SYRINGE
INTRAMUSCULAR | Status: DC | PRN
  Administered 2022-06-22: 20 mg via INTRAVENOUS

## 2022-06-22 MED ORDER — CHLORHEXIDINE GLUCONATE 0.12 % MT SOLN: 15.0000 mL | Freq: Once | OROMUCOSAL | Status: AC

## 2022-06-22 MED ORDER — PHENYLEPHRINE 80 MCG/ML (10ML) SYRINGE FOR IV PUSH (FOR BLOOD PRESSURE SUPPORT)
PREFILLED_SYRINGE | INTRAVENOUS | Status: DC | PRN
  Administered 2022-06-22: 160 ug via INTRAVENOUS
  Administered 2022-06-22: 80 ug via INTRAVENOUS
  Administered 2022-06-22: 160 ug via INTRAVENOUS
  Administered 2022-06-22: 80 ug via INTRAVENOUS
  Administered 2022-06-22: 100 ug via INTRAVENOUS

## 2022-06-22 NOTE — Op Note (Signed)
Video Bronchoscopy with Robotic Assisted Bronchoscopic Navigation   Date of Operation: 06/22/2022   Pre-op Diagnosis: Pulmonary nodules  Post-op Diagnosis: same  Surgeon: Walker Shadow   Anesthesia: General endotracheal anesthesia  Operation: Flexible video fiberoptic bronchoscopy with robotic assistance and biopsies.  Estimated Blood Loss: Minimal  Complications: None  Indications and History: Teresa Edwards is a 71 y.o. female with history of pulmonary nodules, smoking. The risks, benefits, complications, treatment options and expected outcomes were discussed with the patient.  The possibilities of pneumothorax, pneumonia, reaction to medication, pulmonary aspiration, perforation of a viscus, bleeding, failure to diagnose a condition and creating a complication requiring transfusion or operation were discussed with the patient who freely signed the consent.    Description of Procedure: The patient was seen in the Preoperative Area, was examined and was deemed appropriate to proceed.  The patient was taken to [facility] endoscopy room 3, identified as Teresa Edwards and the procedure verified as Flexible Video Fiberoptic Bronchoscopy.  A Time Out was held and the above information confirmed.   Prior to the date of the procedure a high-resolution CT scan of the chest was performed. Utilizing ION software program a virtual tracheobronchial tree was generated to allow the creation of distinct navigation pathways to the patient's parenchymal abnormalities. After being taken to the operating room general anesthesia was initiated and the patient  was orally intubated. The video fiberoptic bronchoscope was introduced via the endotracheal tube and a general inspection was performed which showed normal right and left lung anatomy, aspiration of the bilateral mainstems was completed to remove any remaining secretions. Robotic catheter inserted into patient's endotracheal tube.   Target #1 RUL apical  nodule: The distinct navigation pathways prepared prior to this procedure were then utilized to navigate to patient's lesion identified on CT scan. CIOS imaging was used to aid navigation and confirm ideal location for biopsy. The robotic catheter was secured into place and the vision probe was withdrawn.  Lesion location was approximated using fluoroscopy for peripheral targeting. Under fluoroscopic guidance transbronchial needle biopsies, and transbronchial forceps biopsies were performed to be sent for cytology and pathology. A bronchioalveolar lavage was performed in the RUL and sent for micro.  Target #2 RLL cavitary nodule: The distinct navigation pathways prepared prior to this procedure were then utilized to navigate to patient's lesion identified on CT scan. CIOS imaging was used to aid navigation and confirm ideal location for biopsy. The robotic catheter was secured into place and the vision probe was withdrawn.  Lesion location was approximated using fluoroscopy for peripheral targeting. Under fluoroscopic guidance transbronchial needle biopsies, and transbronchial forceps biopsies were performed to be sent for cytology and pathology. A bronchioalveolar lavage was performed in the RLL and sent for micro.  At the end of the procedure a general airway inspection was performed and there was no evidence of active bleeding. The bronchoscope was removed.  The patient tolerated the procedure well. There was no significant blood loss and there were no obvious complications. A post-procedural chest x-ray is pending.  Samples Target #1: 1. Transbronchial Wang needle biopsies from RUL apical nodule 2. Transbronchial forceps biopsies from RUL apical nodule 3. Bronchoalveolar lavage from RUL apical nodule 4. Endobronchial biopsies from RUL apical nodule  Samples Target #2: 1. Transbronchial Wang needle biopsies from RLL cavitary nodule 2. Transbronchial forceps biopsies from RLL cavitary nodule 3.  Bronchoalveolar lavage from RLL cavitary nodule 4. Endobronchial biopsies from RLL cavitary nodule  Plans:  The patient will be  discharged from the PACU to home when recovered from anesthesia and after chest x-ray is reviewed. We will review the cytology, pathology and microbiology results with the patient when they become available. Outpatient followup will be with me 08/02/21.

## 2022-06-22 NOTE — Discharge Instructions (Signed)
-   Will be in touch in 3-5 business days to discuss results. Can call [phone] if you have any questions about your results in the meantime.  - Bloody cough/bloody streaks and little clots of blood in cough normal for first 24-48 hours after bronch. If coughing up clot size of your thumb or bigger then call our office [phone] and if persistent especially if you also have worsening shortness of breath then would head to the ED - See you in clinic 08/02/21

## 2022-06-22 NOTE — Anesthesia Postprocedure Evaluation (Signed)
Anesthesia Post Note  Patient: Electronics engineer  Procedure(s) Performed: ROBOTIC ASSISTED NAVIGATIONAL BRONCHOSCOPY (Bilateral) BRONCHIAL NEEDLE ASPIRATION BIOPSIES BRONCHIAL BIOPSIES BRONCHIAL WASHINGS     Patient location during evaluation: PACU Anesthesia Type: General Level of consciousness: awake and alert, patient cooperative and oriented Pain management: pain level controlled Vital Signs Assessment: post-procedure vital signs reviewed and stable Respiratory status: spontaneous breathing, nonlabored ventilation, respiratory function stable and patient connected to nasal cannula oxygen Cardiovascular status: blood pressure returned to baseline and stable Postop Assessment: able to ambulate Anesthetic complications: no   No notable events documented.  Last Vitals:  Vitals:   06/22/22 1030 06/22/22 1045  BP: (!) 94/55 (!) 110/53  Pulse: 78 78  Resp: 15 20  Temp:  36.8 C  SpO2: 90% 91%    Last Pain:  Vitals:   06/22/22 1045  TempSrc:   PainSc: 0-No pain                 Shamell Suarez,E. Aislin Onofre

## 2022-06-22 NOTE — Anesthesia Procedure Notes (Signed)
Procedure Name: Intubation Date/Time: 06/15/2022 8:45 AM  Performed by: Eligha Bridegroom, CRNAPre-anesthesia Checklist: Emergency Drugs available, Suction available, Patient identified, Patient being monitored and Timeout performed Patient Re-evaluated:Patient Re-evaluated prior to induction Oxygen Delivery Method: Circle system utilized Preoxygenation: Pre-oxygenation with 100% oxygen Induction Type: IV induction Ventilation: Mask ventilation without difficulty Laryngoscope Size: Mac and 3 Grade View: Grade II Tube type: Oral Tube size: 8.0 mm Airway Equipment and Method: Stylet Placement Confirmation: ETT inserted through vocal cords under direct vision, positive ETCO2 and breath sounds checked- equal and bilateral Secured at: 21 cm Tube secured with: Tape Dental Injury: Teeth and Oropharynx as per pre-operative assessment

## 2022-06-22 NOTE — Transfer of Care (Signed)
Immediate Anesthesia Transfer of Care Note  Patient: Teresa Edwards  Procedure(s) Performed: ROBOTIC ASSISTED NAVIGATIONAL BRONCHOSCOPY (Bilateral) BRONCHIAL NEEDLE ASPIRATION BIOPSIES BRONCHIAL BIOPSIES BRONCHIAL WASHINGS  Patient Location: PACU  Anesthesia Type:General  Level of Consciousness: drowsy  Airway & Oxygen Therapy: Patient Spontanous Breathing  Post-op Assessment: Report given to RN and Post -op Vital signs reviewed and stable  Post vital signs: Reviewed and stable  Last Vitals:  Vitals Value Taken Time  BP    Temp    Pulse    Resp    SpO2      Last Pain:  Vitals:   06/22/22 0625  TempSrc: Oral  PainSc: 0-No pain      Patients Stated Pain Goal: 2 (06/21/22 1620)  Complications: No notable events documented.

## 2022-06-22 NOTE — Anesthesia Preprocedure Evaluation (Addendum)
Anesthesia Evaluation  Patient identified by MRN, date of birth, ID band Patient awake    Reviewed: Allergy & Precautions, NPO status , Patient's Chart, lab work & pertinent test results  History of Anesthesia Complications Negative for: history of anesthetic complications  Airway Mallampati: I  TM Distance: >3 FB Neck ROM: Full    Dental  (+) Dental Advisory Given, Missing, Loose, Poor Dentition   Pulmonary COPD,  COPD inhaler, Current Smoker and Patient abstained from smoking.   breath sounds clear to auscultation       Cardiovascular hypertension, Pt. on medications (-) angina + CAD (coronary calcification) and + DVT   Rhythm:Regular Rate:Normal  02/2022 ECHO: EF 60-65%, normal LVF, Grade 1 DD, normal RVF, no significant valvular abnormalities   Neuro/Psych   Anxiety     negative neurological ROS     GI/Hepatic negative GI ROS, Neg liver ROS,,,  Endo/Other  negative endocrine ROS    Renal/GU negative Renal ROS     Musculoskeletal  (+) Arthritis ,    Abdominal   Peds  Hematology negative hematology ROS (+)   Anesthesia Other Findings   Reproductive/Obstetrics                              Anesthesia Physical Anesthesia Plan  ASA: 3  Anesthesia Plan: General   Post-op Pain Management: Tylenol PO (pre-op)*   Induction: Intravenous  PONV Risk Score and Plan: 2 and Ondansetron and Dexamethasone  Airway Management Planned: Oral ETT  Additional Equipment: None  Intra-op Plan:   Post-operative Plan: Extubation in OR  Informed Consent: I have reviewed the patients History and Physical, chart, labs and discussed the procedure including the risks, benefits and alternatives for the proposed anesthesia with the patient or authorized representative who has indicated his/her understanding and acceptance.     Dental advisory given  Plan Discussed with: CRNA and Surgeon  Anesthesia  Plan Comments:         Anesthesia Quick Evaluation

## 2022-06-22 NOTE — Interval H&P Note (Signed)
History and Physical Interval Note:  06/22/2022 8:22 AM  Teresa Edwards  has presented today for surgery, with the diagnosis of BILATERAL PULMONARY NODULES.  The various methods of treatment have been discussed with the patient and family. After consideration of risks, benefits and other options for treatment, the patient has consented to  Procedure(s): ROBOTIC ASSISTED NAVIGATIONAL BRONCHOSCOPY (Bilateral) as a surgical intervention.  The patient's history has been reviewed, patient examined, no change in status, stable for surgery.  I have reviewed the patient's chart and labs.  Questions were answered to the patient's satisfaction.     Omar Person

## 2022-06-23 LAB — ACID FAST SMEAR (AFB, MYCOBACTERIA)
Acid Fast Smear: NEGATIVE
Acid Fast Smear: NEGATIVE
Acid Fast Smear: NEGATIVE
Acid Fast Smear: NEGATIVE

## 2022-06-24 LAB — CULTURE, BAL-QUANTITATIVE W GRAM STAIN
Culture: NO GROWTH
Culture: NO GROWTH
Culture: NO GROWTH
Gram Stain: NONE SEEN
Gram Stain: NONE SEEN
Gram Stain: NONE SEEN

## 2022-06-25 ENCOUNTER — Encounter (HOSPITAL_COMMUNITY): Payer: Self-pay | Admitting: Student

## 2022-06-25 LAB — CULTURE, BAL-QUANTITATIVE W GRAM STAIN
Culture: NO GROWTH
Gram Stain: NONE SEEN

## 2022-06-26 LAB — CYTOLOGY - NON PAP

## 2022-06-27 LAB — AEROBIC/ANAEROBIC CULTURE W GRAM STAIN (SURGICAL/DEEP WOUND)
Culture: NORMAL
Gram Stain: NONE SEEN
Gram Stain: NONE SEEN
Gram Stain: NONE SEEN

## 2022-06-27 LAB — FUNGUS CULTURE WITH STAIN

## 2022-06-27 LAB — FUNGUS CULTURE RESULT

## 2022-06-27 LAB — FUNGAL ORGANISM REFLEX

## 2022-07-11 ENCOUNTER — Other Ambulatory Visit (HOSPITAL_COMMUNITY): Payer: Medicare Other

## 2022-07-14 ENCOUNTER — Telehealth: Payer: Self-pay | Admitting: Student

## 2022-07-14 DIAGNOSIS — R918 Other nonspecific abnormal finding of lung field: Secondary | ICD-10-CM

## 2022-07-14 NOTE — Telephone Encounter (Signed)
ATC X1 LVM asking patient to give [place] a call back

## 2022-07-18 ENCOUNTER — Telehealth: Payer: Self-pay | Admitting: Student

## 2022-07-18 MED ORDER — POSACONAZOLE 100 MG PO TBEC: DELAYED_RELEASE_TABLET | ORAL | 1 refills | Status: DC

## 2022-07-18 NOTE — Telephone Encounter (Signed)
I'd like to start posaconazole once she has had repeat CMP, CBC. How would we go about securing financial assistance for it?  Thanks!!

## 2022-07-18 NOTE — Telephone Encounter (Signed)
Called and discussed. Chronic cavitary pulmonary aspergillosis/aspergillus nodules. Needs CMP, CBC/diff then tentative plan to treat with posaconazole for 8 weeks then repeat CT Chest, clinic visit.   Can we cancel her CT 07/31/21 and clinic visit with me 08/02/21 and postpone both till 09/25/21 or so?

## 2022-07-18 NOTE — Telephone Encounter (Signed)
Spoke with pt who is requesting finalized bronch results from procedure on 06/22/22. Dr. Verlee Monte can you please advise on those results? Thank you  Pt was also given [place] website address to f/u with financial assistance.

## 2022-07-19 ENCOUNTER — Telehealth: Payer: Self-pay

## 2022-07-19 NOTE — Telephone Encounter (Signed)
PA request received via CMM for Posaconazole 100MG  dr tablets through [employer] Medicare Part D.  PA has been submitted and APPROVED from 07/19/2022-10/18/2022.  Patient pharmacy ( [facility], [place] Phone: [phone]  Fax: [phone]  called and claim has been processed.

## 2022-07-19 NOTE — Telephone Encounter (Signed)
1/17 appt has been cancelled. Recall placed for March. Nothing further needed.

## 2022-07-21 LAB — FUNGUS CULTURE WITH STAIN

## 2022-07-21 LAB — FUNGAL ORGANISM REFLEX

## 2022-07-21 LAB — FUNGUS CULTURE RESULT

## 2022-07-24 ENCOUNTER — Other Ambulatory Visit (INDEPENDENT_AMBULATORY_CARE_PROVIDER_SITE_OTHER): Payer: 59

## 2022-07-24 DIAGNOSIS — R918 Other nonspecific abnormal finding of lung field: Secondary | ICD-10-CM

## 2022-07-24 DIAGNOSIS — G894 Chronic pain syndrome: Secondary | ICD-10-CM | POA: Diagnosis not present

## 2022-07-24 DIAGNOSIS — J449 Chronic obstructive pulmonary disease, unspecified: Secondary | ICD-10-CM | POA: Diagnosis not present

## 2022-07-24 DIAGNOSIS — M539 Dorsopathy, unspecified: Secondary | ICD-10-CM | POA: Diagnosis not present

## 2022-07-24 DIAGNOSIS — Z9181 History of falling: Secondary | ICD-10-CM | POA: Diagnosis not present

## 2022-07-24 DIAGNOSIS — E1142 Type 2 diabetes mellitus with diabetic polyneuropathy: Secondary | ICD-10-CM | POA: Diagnosis not present

## 2022-07-24 DIAGNOSIS — G8929 Other chronic pain: Secondary | ICD-10-CM | POA: Diagnosis not present

## 2022-07-24 DIAGNOSIS — M545 Low back pain, unspecified: Secondary | ICD-10-CM | POA: Diagnosis not present

## 2022-07-24 DIAGNOSIS — Z79899 Other long term (current) drug therapy: Secondary | ICD-10-CM | POA: Diagnosis not present

## 2022-07-24 DIAGNOSIS — R030 Elevated blood-pressure reading, without diagnosis of hypertension: Secondary | ICD-10-CM | POA: Diagnosis not present

## 2022-07-24 DIAGNOSIS — Z6820 Body mass index (BMI) 20.0-20.9, adult: Secondary | ICD-10-CM | POA: Diagnosis not present

## 2022-07-24 LAB — CBC WITH DIFFERENTIAL/PLATELET
Basophils Absolute: 0.1 10*3/uL (ref 0.0–0.1)
Basophils Relative: 0.5 % (ref 0.0–3.0)
Eosinophils Absolute: 0.8 10*3/uL — ABNORMAL HIGH (ref 0.0–0.7)
Eosinophils Relative: 6.6 % — ABNORMAL HIGH (ref 0.0–5.0)
HCT: 41.3 % (ref 36.0–46.0)
Hemoglobin: 13.3 g/dL (ref 12.0–15.0)
Lymphocytes Relative: 20.7 % (ref 12.0–46.0)
Lymphs Abs: 2.4 10*3/uL (ref 0.7–4.0)
MCHC: 32.2 g/dL (ref 30.0–36.0)
MCV: 97 fl (ref 78.0–100.0)
Monocytes Absolute: 0.8 10*3/uL (ref 0.1–1.0)
Monocytes Relative: 6.5 % (ref 3.0–12.0)
Neutro Abs: 7.7 10*3/uL (ref 1.4–7.7)
Neutrophils Relative %: 65.7 % (ref 43.0–77.0)
Platelets: 374 10*3/uL (ref 150.0–400.0)
RBC: 4.25 Mil/uL (ref 3.87–5.11)
RDW: 15.8 % — ABNORMAL HIGH (ref 11.5–15.5)
WBC: 11.7 10*3/uL — ABNORMAL HIGH (ref 4.0–10.5)

## 2022-07-24 LAB — COMPREHENSIVE METABOLIC PANEL
ALT: 7 U/L (ref 0–35)
AST: 14 U/L (ref 0–37)
Albumin: 3.9 g/dL (ref 3.5–5.2)
Alkaline Phosphatase: 94 U/L (ref 39–117)
BUN: 25 mg/dL — ABNORMAL HIGH (ref 6–23)
CO2: 27 mEq/L (ref 19–32)
Calcium: 9.3 mg/dL (ref 8.4–10.5)
Chloride: 106 mEq/L (ref 96–112)
Creatinine, Ser: 0.79 mg/dL (ref 0.40–1.20)
GFR: 75.17 mL/min (ref >60.00)
Glucose, Bld: 89 mg/dL (ref 70–99)
Potassium: 3.6 mEq/L (ref 3.5–5.1)
Sodium: 143 mEq/L (ref 135–145)
Total Bilirubin: 0.2 mg/dL (ref 0.2–1.2)
Total Protein: 7.5 g/dL (ref 6.0–8.3)

## 2022-07-31 ENCOUNTER — Ambulatory Visit (HOSPITAL_COMMUNITY): Admission: RE | Admit: 2022-07-31 | Payer: 59 | Source: Ambulatory Visit

## 2022-08-02 ENCOUNTER — Ambulatory Visit: Payer: Medicare Other | Admitting: Student

## 2022-08-02 NOTE — Telephone Encounter (Signed)
Appears prescription required PA and was processed through insurance by pharmacy. PAP was not required.  Knox Saliva, PharmD, MPH, BCPS, CPP Clinical Pharmacist (Rheumatology and Pulmonology)

## 2022-08-06 LAB — ACID FAST CULTURE WITH REFLEXED SENSITIVITIES (MYCOBACTERIA)
Acid Fast Culture: NEGATIVE
Acid Fast Culture: NEGATIVE
Acid Fast Culture: NEGATIVE
Acid Fast Culture: NEGATIVE

## 2022-08-14 ENCOUNTER — Other Ambulatory Visit (HOSPITAL_COMMUNITY): Payer: Medicare Other

## 2022-08-21 DIAGNOSIS — Z9181 History of falling: Secondary | ICD-10-CM | POA: Diagnosis not present

## 2022-08-21 DIAGNOSIS — E1142 Type 2 diabetes mellitus with diabetic polyneuropathy: Secondary | ICD-10-CM | POA: Diagnosis not present

## 2022-08-21 DIAGNOSIS — Z681 Body mass index (BMI) 19 or less, adult: Secondary | ICD-10-CM | POA: Diagnosis not present

## 2022-08-21 DIAGNOSIS — G8929 Other chronic pain: Secondary | ICD-10-CM | POA: Diagnosis not present

## 2022-08-21 DIAGNOSIS — M539 Dorsopathy, unspecified: Secondary | ICD-10-CM | POA: Diagnosis not present

## 2022-08-21 DIAGNOSIS — G894 Chronic pain syndrome: Secondary | ICD-10-CM | POA: Diagnosis not present

## 2022-08-21 DIAGNOSIS — Z79899 Other long term (current) drug therapy: Secondary | ICD-10-CM | POA: Diagnosis not present

## 2022-08-21 DIAGNOSIS — M545 Low back pain, unspecified: Secondary | ICD-10-CM | POA: Diagnosis not present

## 2022-08-21 DIAGNOSIS — R030 Elevated blood-pressure reading, without diagnosis of hypertension: Secondary | ICD-10-CM | POA: Diagnosis not present

## 2022-08-21 DIAGNOSIS — J449 Chronic obstructive pulmonary disease, unspecified: Secondary | ICD-10-CM | POA: Diagnosis not present

## 2022-09-18 DIAGNOSIS — Z9181 History of falling: Secondary | ICD-10-CM | POA: Diagnosis not present

## 2022-09-18 DIAGNOSIS — R030 Elevated blood-pressure reading, without diagnosis of hypertension: Secondary | ICD-10-CM | POA: Diagnosis not present

## 2022-09-18 DIAGNOSIS — M545 Low back pain, unspecified: Secondary | ICD-10-CM | POA: Diagnosis not present

## 2022-09-18 DIAGNOSIS — E1142 Type 2 diabetes mellitus with diabetic polyneuropathy: Secondary | ICD-10-CM | POA: Diagnosis not present

## 2022-09-18 DIAGNOSIS — Z6820 Body mass index (BMI) 20.0-20.9, adult: Secondary | ICD-10-CM | POA: Diagnosis not present

## 2022-09-18 DIAGNOSIS — M539 Dorsopathy, unspecified: Secondary | ICD-10-CM | POA: Diagnosis not present

## 2022-09-18 DIAGNOSIS — Z79899 Other long term (current) drug therapy: Secondary | ICD-10-CM | POA: Diagnosis not present

## 2022-09-18 DIAGNOSIS — J449 Chronic obstructive pulmonary disease, unspecified: Secondary | ICD-10-CM | POA: Diagnosis not present

## 2022-09-18 DIAGNOSIS — G8929 Other chronic pain: Secondary | ICD-10-CM | POA: Diagnosis not present

## 2022-09-18 DIAGNOSIS — G894 Chronic pain syndrome: Secondary | ICD-10-CM | POA: Diagnosis not present

## 2022-09-19 ENCOUNTER — Ambulatory Visit (HOSPITAL_COMMUNITY)
Admission: RE | Admit: 2022-09-19 | Discharge: 2022-09-19 | Disposition: A | Discharge status: Home / Self Care | Payer: 59 | Source: Ambulatory Visit | Attending: Student | Admitting: Student

## 2022-09-19 DIAGNOSIS — R918 Other nonspecific abnormal finding of lung field: Secondary | ICD-10-CM

## 2022-09-19 DIAGNOSIS — J432 Centrilobular emphysema: Secondary | ICD-10-CM | POA: Diagnosis not present

## 2022-10-02 ENCOUNTER — Telehealth: Payer: Self-pay | Admitting: Student

## 2022-10-02 NOTE — Telephone Encounter (Signed)
PT calling on CT results. Pls call @ [phone]

## 2022-10-03 NOTE — Telephone Encounter (Signed)
Called and spoke with patient. Patient wants to know the results of her CT.   NM, please advise.

## 2022-10-04 ENCOUNTER — Other Ambulatory Visit: Payer: Self-pay

## 2022-10-04 MED ORDER — POSACONAZOLE 100 MG PO TBEC: DELAYED_RELEASE_TABLET | ORAL | 4 refills | Status: DC

## 2022-10-04 NOTE — Telephone Encounter (Signed)
Left voicemail and sending mychart message

## 2022-10-04 NOTE — Telephone Encounter (Signed)
Read not to PT. She does not have access to her [facility]. Will wait to sched a FU appt w/Dr. Verlee Monte when she is with her daughter because she is her transportation.  Please refill the med mentioned in the note from Dr. Verlee Monte. She only had 2 RX's  Pharm is: [facility] in [place].

## 2022-10-04 NOTE — Telephone Encounter (Signed)
refilled 

## 2022-10-04 NOTE — Telephone Encounter (Signed)
Called and spoke w/ pt read NM impression on her CT scan and discussed getting her scheduled for an OV. She verbalized that I would need to call her daughter since she will have to bring pt.   ATC daughter Karna Christmas @ [phone], front desk if daughter returns call please get pt scheduled to come in for OV w/ NM (next available).

## 2022-10-04 NOTE — Telephone Encounter (Signed)
Can we get her an appointment, next available?  Thanks!

## 2022-10-05 ENCOUNTER — Ambulatory Visit: Payer: Medicare Other | Admitting: Cardiology

## 2022-10-05 NOTE — Progress Notes (Deleted)
    Cardiology Office Note  Date: 10/05/2022   ID: [employer], DOB 01-05-1951, MRN [id number]  History of Present Illness: Teresa Edwards is a 72 y.o. female seen in consultation back in September 2023.  Physical Exam: VS:  There were no vitals taken for this visit., BMI There is no height or weight on file to calculate BMI.  Wt Readings from Last 3 Encounters:  06/22/22 118 lb (53.5 kg)  06/07/22 116 lb 6.4 oz (52.8 kg)  04/03/22 119 lb 9.6 oz (54.3 kg)    General: Patient appears comfortable at rest. HEENT: Conjunctiva and lids normal, oropharynx clear with moist mucosa. Neck: Supple, no elevated JVP or carotid bruits, no thyromegaly. Lungs: Clear to auscultation, nonlabored breathing at rest. Cardiac: Regular rate and rhythm, no S3 or significant systolic murmur, no pericardial rub. Abdomen: Soft, nontender, no hepatomegaly, bowel sounds present, no guarding or rebound. Extremities: No pitting edema, distal pulses 2+. Skin: Warm and dry. Musculoskeletal: No kyphosis. Neuropsychiatric: Alert and oriented x3, affect grossly appropriate.  ECG:  An ECG dated 03/02/2022 was personally reviewed today and demonstrated:  Sinus rhythm with PAC.  Labwork: August 2023: Cholesterol 163, triglycerides 189, HDL 43, LDL 88 07/24/2022: ALT 7; AST 14; BUN 25; Creatinine, Ser 0.79; Hemoglobin 13.3; Platelets 374.0; Potassium 3.6; Sodium 143   Other Studies Reviewed Today:  Echocardiogram 03/03/2022:  1. Left ventricular ejection fraction, by estimation, is 60 to 65%. The  left ventricle has normal function. The left ventricle has no regional  wall motion abnormalities. Left ventricular diastolic parameters are  consistent with Grade I diastolic  dysfunction (impaired relaxation).   2. Right ventricular systolic function is normal. The right ventricular  size is normal. Tricuspid regurgitation signal is inadequate for assessing  PA pressure.   3. The mitral valve is normal in  structure. No evidence of mitral valve  regurgitation.   4. The aortic valve is normal in structure. Aortic valve regurgitation is  not visualized.   5. The inferior vena cava is normal in size with greater than 50%  respiratory variability, suggesting right atrial pressure of 3 mmHg.   Assessment and Plan:  1.  Atherosclerosis by CT imaging.  She is not on statin therapy at this time.  If it panel from August 2023 showed LDL 88.  2.  Essential hypertension.  3.  COPD with cavitary lung nodules documented by workup last year she underwent bronchoscopy with Dr. Verlee Monte in December 2023 and diagnosed with chronic cavitary pulmonary aspergillosis.  Recent chest CT showed improving anterior right lower lobe opacity/consolidation with residual cavitary nodule, also additional cavitary nodules within the right middle and lower lobes.  She continues to follow with Pulmonary.  Disposition:  Follow up {follow up:15908}  Signed, Satira Sark, M.D., F.A.C.C.

## 2022-10-06 ENCOUNTER — Encounter: Payer: Self-pay | Admitting: Cardiology

## 2022-10-16 DIAGNOSIS — Z6820 Body mass index (BMI) 20.0-20.9, adult: Secondary | ICD-10-CM | POA: Diagnosis not present

## 2022-10-16 DIAGNOSIS — Z9181 History of falling: Secondary | ICD-10-CM | POA: Diagnosis not present

## 2022-10-16 DIAGNOSIS — Z79899 Other long term (current) drug therapy: Secondary | ICD-10-CM | POA: Diagnosis not present

## 2022-10-16 DIAGNOSIS — G894 Chronic pain syndrome: Secondary | ICD-10-CM | POA: Diagnosis not present

## 2022-10-16 DIAGNOSIS — M539 Dorsopathy, unspecified: Secondary | ICD-10-CM | POA: Diagnosis not present

## 2022-10-16 DIAGNOSIS — E1142 Type 2 diabetes mellitus with diabetic polyneuropathy: Secondary | ICD-10-CM | POA: Diagnosis not present

## 2022-10-16 DIAGNOSIS — J449 Chronic obstructive pulmonary disease, unspecified: Secondary | ICD-10-CM | POA: Diagnosis not present

## 2022-10-16 DIAGNOSIS — R030 Elevated blood-pressure reading, without diagnosis of hypertension: Secondary | ICD-10-CM | POA: Diagnosis not present

## 2022-10-16 DIAGNOSIS — M545 Low back pain, unspecified: Secondary | ICD-10-CM | POA: Diagnosis not present

## 2022-10-16 DIAGNOSIS — G8929 Other chronic pain: Secondary | ICD-10-CM | POA: Diagnosis not present

## 2022-10-17 NOTE — Progress Notes (Unsigned)
Synopsis: Referred for pulmonary nodule by Verdel Nation, MD  Subjective:   PATIENT ID: [place]: female DOB: 09-Aug-1950, MRN: [id number]  No chief complaint on file.  CC: dyspnea on exertion  70yM with COPD referred for pulmonary nodules (dominant 1.6cm RML decreased from  2.84mm on 04/01/21 scan)  She smoked 50 years (~50 py smoking) active, down to 1 cigarette per day. She does have quite a bit of chest congestion. Finished course of antibiotics on Monday and that has helped quite a bit. She has no trouble swallowing, no aspiration. She has had no fever, she has had no weight loss, no night sweats drenching the sheets.   She has DOE to 50 yards. She is on trelegy. She has never done pulmonary rehab.   She has never had a bronchoscopy or TTNA.    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 She has no pets at home. No pet bird. No hot tub  Interval HPI: waxing/waning cavitary pulm nodules sent to ED in August for progressive DOE and CT findings that initially raised concern for septic embolization  RUL apical lesion does look a bit more solid/larger than in August  She is doing 'ok I guess.'   She has no cough really, just at night sometimes.   Still smoking but if she buys a pack, her grandson will steal them all. She does have some DOE when she's walking.  ------------------------------------------ Started on posaconazole since last visit   Surveillance CT Chest with decreased size of RLL consolidation/cavitary lesion, other nodules stable  Increased to trelegy 200 last visit  Otherwise pertinent review of systems is negative.  Past Medical History:  Diagnosis Date   Aortic atherosclerosis ([place])    Cavitary lung disease    Chronic pain    COPD (chronic obstructive pulmonary disease) (HCC)    Coronary  artery calcification seen on CT scan    DDD (degenerative disc disease)    DVT femoral (deep venous thrombosis) with thrombophlebitis (HCC)    Dyspnea    Pt states she has had for months   Hypertension      Family History  Problem Relation Age of Onset   Deep vein thrombosis Mother    Other Mother        varicose veins   Hypertension Sister    Other Sister        varicose veins   Cancer Brother      Past Surgical History:  Procedure Laterality Date   BRONCHIAL BIOPSY  06/22/2022   Procedure: BRONCHIAL BIOPSIES;  Surgeon: Maryjane Hurter, MD;  Location[facility] ENDOSCOPY;  Service: Pulmonary;;   BRONCHIAL NEEDLE ASPIRATION BIOPSY  06/22/2022   Procedure: BRONCHIAL NEEDLE ASPIRATION BIOPSIES;  Surgeon: Maryjane Hurter, MD;  Location: [facility] ENDOSCOPY;  Service: Pulmonary;;   BRONCHIAL WASHINGS  06/22/2022   Procedure: BRONCHIAL WASHINGS;  Surgeon: Maryjane Hurter, MD;  [facility] ENDOSCOPY;  Service: Pulmonary;;   TOTAL HIP ARTHROPLASTY Right    TUBAL LIGATION  10/1971    Social History   Socioeconomic History   Marital status: Widowed    Spouse name: Not on file   Number of children: 2   Years of education: 63   Highest education level: Not on file  Occupational History   Occupation: homemaker  Tobacco Use   Smoking status:  Every Day    Packs/day: 0.50    Years: 40.00    Additional pack years: 0.00    Total pack years: 20.00    Types: Cigarettes   Smokeless tobacco: Never   Tobacco comments:    1/2 ppd 06/07/22   Vaping Use   Vaping Use: Never used  Substance and Sexual Activity   Alcohol use: No    Alcohol/week: 0.0 standard drinks of alcohol   Drug use: No   Sexual activity: Not on file  Other Topics Concern   Not on file  Social History Narrative   Not on file   Social Determinants of Health   Financial Resource Strain: Not on file  Food Insecurity: Not on file  Transportation Needs: Not on file  Physical Activity: Not on file  Stress: Not on file   Social Connections: Not on file  Intimate Partner Violence: Not on file     No Known Allergies   Outpatient Medications Prior to Visit  Medication Sig Dispense Refill   albuterol (PROVENTIL HFA;VENTOLIN HFA) 108 (90 Base) MCG/ACT inhaler Inhale 2 puffs into the lungs every 6 (six) hours as needed for wheezing or shortness of breath. 1 Inhaler 2   albuterol (PROVENTIL) (2.5 MG/3ML) 0.083% nebulizer solution Take 3 mLs (2.5 mg total) by nebulization every 6 (six) hours as needed for wheezing or shortness of breath. 75 mL 12   diltiazem (CARDIZEM CD) 180 MG 24 hr capsule Take 180 mg by mouth daily.     Fluticasone-Umeclidin-Vilant (TRELEGY ELLIPTA) 200-62.5-25 MCG/ACT AEPB Inhale 1 puff into the lungs daily. 14 each 0   losartan (COZAAR) 25 MG tablet Take 25 mg by mouth daily.     oxycodone (ROXICODONE) 30 MG immediate release tablet Take 15-30 mg by mouth every 4 (four) hours as needed for pain.      posaconazole (NOXAFIL) 100 MG TBEC delayed-release tablet Two tablets on day 1, then one tablet daily till gone 30 tablet 4   No facility-administered medications prior to visit.       Objective:   Physical Exam:  General appearance: 72 y.o., female, NAD, conversant, chroincally ill appearing Eyes: anicteric sclerae, moist conjunctivae; no lid-lag; PERRL, tracking appropriately HENT: NCAT; oropharynx, MMM, no mucosal ulcerations; normal hard and soft palate, poor dentition Neck: Trachea midline; no lymphadenopathy, no JVD Lungs: diminished b/l, no crackles, no wheeze, with normal respiratory effort CV: RRR, no MRGs  Abdomen: Soft, non-tender; non-distended, BS present  Extremities: No peripheral edema, radial and DP pulses present bilaterally  Skin: Normal temperature, turgor and texture; no rash Psych: Appropriate affect Neuro: Alert and oriented to person and place, no focal deficit    There were no vitals filed for this visit.    on RA BMI Readings from Last 3 Encounters:   06/22/22 20.25 kg/m  06/07/22 19.98 kg/m  04/03/22 20.53 kg/m   Wt Readings from Last 3 Encounters:  06/22/22 118 lb (53.5 kg)  06/07/22 116 lb 6.4 oz (52.8 kg)  04/03/22 119 lb 9.6 oz (54.3 kg)     CBC    Component Value Date/Time   WBC 11.7 (H) 07/24/2022 1133   RBC 4.25 07/24/2022 1133   HGB 13.3 07/24/2022 1133   HCT 41.3 07/24/2022 1133   PLT 374.0 07/24/2022 1133   MCV 97.0 07/24/2022 1133   MCH 31.9 06/22/2022 0546   MCHC 32.2 07/24/2022 1133   RDW 15.8 (H) 07/24/2022 1133   LYMPHSABS 2.4 07/24/2022 1133   MONOABS 0.8 07/24/2022 1133  EOSABS 0.8 (H) 07/24/2022 1133   BASOSABS 0.1 07/24/2022 1133   Quant gold negative, ANCAs neg, AFB sputum 9/22 neg  Chest Imaging:  CTA 10/2016 reviewed by me and remarkable for significant TIB nodularity esp RML, RLL, smaller RLLL cavitary lesion  CT Chest 04/01/21 reviewed by me and remarkable for  Interval decrease in size of RML nodule relative to CTA Chest 01/2021  CTA Chest 03/02/22 reviewed by me with multiple stable cavitary nodules  CT Chest 04/19/22 reviewed by me - RUL apical lesion does look a bit larger/more solid than prior but agree otherwise remarkable for waxing/waning pulm nodules  CT Chest 09/22/22 with decreased size of RLL consolidation/cavitary lesion, other nodules stable  Pulmonary Functions Testing Results:     No data to display              Assessment & Plan:   # Pulmonary nodules:  Appearance and waxing/waning size with background of TIB overall suggestive of cavitary NTM disease or fungal infection. Didn't change much after 1 month course of augmentin.   # Likely COPD  # Smoking  Plan: - Recommended nav bronch with attention to apical RUL lesion especially but would try to sample the other cavitary nodules as well. Risks of procedure discussed, risks of watchful waiting reviewed. She will think about it and for now I've at least ordered 3 month follow up and clinic visit afterward at a  minimum.  - flutter valve 10 slow but firm puffs daily after trelegy - trial increase to trelegy 200 - declined PFTs last visit - smoking cessation - she will try on her own       RTC January after CT Chest unless elects to pursue nav bronch now  Maryjane Hurter, MD [facility] 10/17/2022 6:44 AM

## 2022-10-18 ENCOUNTER — Encounter: Payer: Self-pay | Admitting: Student

## 2022-10-18 ENCOUNTER — Ambulatory Visit (INDEPENDENT_AMBULATORY_CARE_PROVIDER_SITE_OTHER): Payer: 59 | Admitting: Student

## 2022-10-18 ENCOUNTER — Other Ambulatory Visit (INDEPENDENT_AMBULATORY_CARE_PROVIDER_SITE_OTHER): Payer: 59

## 2022-10-18 VITALS — BP 150/82 | HR 70 | Temp 98.0°F | Ht 64.0 in | Wt 117.8 lb

## 2022-10-18 DIAGNOSIS — D721 Eosinophilia, unspecified: Secondary | ICD-10-CM

## 2022-10-18 DIAGNOSIS — B441 Other pulmonary aspergillosis: Secondary | ICD-10-CM | POA: Diagnosis not present

## 2022-10-18 LAB — CBC WITH DIFFERENTIAL/PLATELET
Basophils Absolute: 0.1 10*3/uL (ref 0.0–0.1)
Basophils Relative: 1.4 % (ref 0.0–3.0)
Eosinophils Absolute: 0.4 10*3/uL (ref 0.0–0.7)
Eosinophils Relative: 5.4 % — ABNORMAL HIGH (ref 0.0–5.0)
HCT: 41.3 % (ref 36.0–46.0)
Hemoglobin: 13.5 g/dL (ref 12.0–15.0)
Lymphocytes Relative: 25 % (ref 12.0–46.0)
Lymphs Abs: 1.9 10*3/uL (ref 0.7–4.0)
MCHC: 32.8 g/dL (ref 30.0–36.0)
MCV: 93.3 fl (ref 78.0–100.0)
Monocytes Absolute: 0.7 10*3/uL (ref 0.1–1.0)
Monocytes Relative: 9.1 % (ref 3.0–12.0)
Neutro Abs: 4.5 10*3/uL (ref 1.4–7.7)
Neutrophils Relative %: 59.1 % (ref 43.0–77.0)
Platelets: 258 10*3/uL (ref 150.0–400.0)
RBC: 4.43 Mil/uL (ref 3.87–5.11)
RDW: 16.3 % — ABNORMAL HIGH (ref 11.5–15.5)
WBC: 7.6 10*3/uL (ref 4.0–10.5)

## 2022-10-18 MED ORDER — POSACONAZOLE 100 MG PO TBEC: 300.0000 mg | DELAYED_RELEASE_TABLET | Freq: Every day | ORAL | 4 refills | Status: DC

## 2022-10-18 NOTE — Patient Instructions (Addendum)
-   I would recommend navigational bronchoscopy to figure out what these spots are on your lung. I'm most concerned about one in your right upper lobe.  - ct chest in August - continue trelegy 1 puff once daily, rinse mouth and brush teeth/tongue after each use - albuterol as needed - stay on posaconazole but make sure you're taking 300 mg daily

## 2022-10-19 LAB — IGE: IgE (Immunoglobulin E), Serum: 2813 kU/L — ABNORMAL HIGH (ref <=114)

## 2022-10-24 ENCOUNTER — Telehealth: Payer: Self-pay

## 2022-10-24 LAB — ASPERGILLUS IGE PANEL
A. Amstel/Glaucu Class Interp: 3
A. Flavus Class Interp: 2
A. Fumigatus Class Interp: 5
A. Nidulans Class Interp: 1
A. Niger Class Interp: 2
A. Versicolor Class Interp: 2
Aspergillus amstel/glaucu IgE*: 4.76 kU/L — ABNORMAL HIGH (ref <0.35)
Aspergillus flavus IgE: 1.2 kU/L — ABNORMAL HIGH (ref <0.35)
Aspergillus fumigatus IgE: 71.2 kU/L — ABNORMAL HIGH (ref <0.35)
Aspergillus nidulans IgE: 0.61 kU/L — ABNORMAL HIGH (ref <0.35)
Aspergillus niger IgE: 3.05 kU/L — ABNORMAL HIGH (ref <0.35)
Aspergillus versicolor IgE: 1.13 kU/L — ABNORMAL HIGH (ref <0.35)

## 2022-10-24 NOTE — Telephone Encounter (Signed)
PA has been APPROVED from 10/24/2022-01/23/2023

## 2022-10-24 NOTE — Telephone Encounter (Signed)
PA request received via CMM for Posaconazole 100MG  dr tablets  PA has been submitted to [employer] Medicare Part D and is pending determination.  Key: [id number]

## 2022-11-20 DIAGNOSIS — Z681 Body mass index (BMI) 19 or less, adult: Secondary | ICD-10-CM | POA: Diagnosis not present

## 2022-11-20 DIAGNOSIS — E1142 Type 2 diabetes mellitus with diabetic polyneuropathy: Secondary | ICD-10-CM | POA: Diagnosis not present

## 2022-11-20 DIAGNOSIS — G8929 Other chronic pain: Secondary | ICD-10-CM | POA: Diagnosis not present

## 2022-11-20 DIAGNOSIS — J449 Chronic obstructive pulmonary disease, unspecified: Secondary | ICD-10-CM | POA: Diagnosis not present

## 2022-11-20 DIAGNOSIS — Z9181 History of falling: Secondary | ICD-10-CM | POA: Diagnosis not present

## 2022-11-20 DIAGNOSIS — R030 Elevated blood-pressure reading, without diagnosis of hypertension: Secondary | ICD-10-CM | POA: Diagnosis not present

## 2022-11-20 DIAGNOSIS — M545 Low back pain, unspecified: Secondary | ICD-10-CM | POA: Diagnosis not present

## 2022-11-20 DIAGNOSIS — M539 Dorsopathy, unspecified: Secondary | ICD-10-CM | POA: Diagnosis not present

## 2022-11-20 DIAGNOSIS — Z79899 Other long term (current) drug therapy: Secondary | ICD-10-CM | POA: Diagnosis not present

## 2022-11-27 DIAGNOSIS — R030 Elevated blood-pressure reading, without diagnosis of hypertension: Secondary | ICD-10-CM | POA: Diagnosis not present

## 2022-11-27 DIAGNOSIS — F17210 Nicotine dependence, cigarettes, uncomplicated: Secondary | ICD-10-CM | POA: Diagnosis not present

## 2022-11-27 DIAGNOSIS — R300 Dysuria: Secondary | ICD-10-CM | POA: Diagnosis not present

## 2022-11-27 DIAGNOSIS — R5383 Other fatigue: Secondary | ICD-10-CM | POA: Diagnosis not present

## 2022-11-27 DIAGNOSIS — Z681 Body mass index (BMI) 19 or less, adult: Secondary | ICD-10-CM | POA: Diagnosis not present

## 2022-11-27 DIAGNOSIS — J449 Chronic obstructive pulmonary disease, unspecified: Secondary | ICD-10-CM | POA: Diagnosis not present

## 2022-11-27 DIAGNOSIS — B49 Unspecified mycosis: Secondary | ICD-10-CM | POA: Diagnosis not present

## 2022-12-08 ENCOUNTER — Telehealth: Payer: Self-pay | Admitting: Student

## 2022-12-08 NOTE — Telephone Encounter (Signed)
PT calling about posaconazole (NOXAFIL) 100 MG TBEC delayed-release tablet [[id number]]   it is making her sleep night and day. Seeks a poss alternative. Please call @ [phone]

## 2022-12-08 NOTE — Telephone Encounter (Signed)
Spoke with patient. She states the Noxafil medication is making her sleep day and night and has also caused he potassium to drop a little. States there was no issue in the beginning she states after the Noxafil was changed to 300mg  that when she started experiencing side effects. Patient is requesting an alternative if possible  Dr.Meier please advise  Pharmacy [facility] in [place]

## 2022-12-08 NOTE — Telephone Encounter (Signed)
PT calling about posaconazole (NOXAFIL) 100 MG TBEC delayed-release tablet [[id number]]   it is making her sleep night and day. Seeks a poss alternative. Please call @ [phone] 

## 2022-12-08 NOTE — Telephone Encounter (Signed)
I left message asking her to stop posaconazole. Can we encourage her to come in for appointment to discuss this reaction, alternative medications? She also appears to likely have allergic bronchopulmonary aspergillosis and I'd like to talk about treatment options for that as well.

## 2022-12-08 NOTE — Telephone Encounter (Signed)
Closing encounter since multiple ones are open

## 2022-12-13 NOTE — Telephone Encounter (Signed)
I called and spoke with the pt She received Dr Lind Guest msg and has stopped posaconazole  I offered appt for this wk  She states will need to call her daughter first  She will call back later  Will hold in my basket to ensure she makes appt

## 2022-12-20 DIAGNOSIS — R030 Elevated blood-pressure reading, without diagnosis of hypertension: Secondary | ICD-10-CM | POA: Diagnosis not present

## 2022-12-20 DIAGNOSIS — Z9181 History of falling: Secondary | ICD-10-CM | POA: Diagnosis not present

## 2022-12-20 DIAGNOSIS — J449 Chronic obstructive pulmonary disease, unspecified: Secondary | ICD-10-CM | POA: Diagnosis not present

## 2022-12-20 DIAGNOSIS — Z79899 Other long term (current) drug therapy: Secondary | ICD-10-CM | POA: Diagnosis not present

## 2022-12-20 DIAGNOSIS — M545 Low back pain, unspecified: Secondary | ICD-10-CM | POA: Diagnosis not present

## 2022-12-20 DIAGNOSIS — G8929 Other chronic pain: Secondary | ICD-10-CM | POA: Diagnosis not present

## 2022-12-20 DIAGNOSIS — E1142 Type 2 diabetes mellitus with diabetic polyneuropathy: Secondary | ICD-10-CM | POA: Diagnosis not present

## 2022-12-20 DIAGNOSIS — M539 Dorsopathy, unspecified: Secondary | ICD-10-CM | POA: Diagnosis not present

## 2022-12-20 DIAGNOSIS — Z681 Body mass index (BMI) 19 or less, adult: Secondary | ICD-10-CM | POA: Diagnosis not present

## 2022-12-27 ENCOUNTER — Ambulatory Visit: Payer: 59 | Admitting: Student

## 2023-01-16 ENCOUNTER — Encounter: Payer: Self-pay | Admitting: Adult Health

## 2023-01-16 ENCOUNTER — Ambulatory Visit (INDEPENDENT_AMBULATORY_CARE_PROVIDER_SITE_OTHER): Payer: 59 | Admitting: Adult Health

## 2023-01-16 VITALS — BP 126/60 | HR 73 | Temp 97.6°F | Ht 64.0 in | Wt 109.4 lb

## 2023-01-16 DIAGNOSIS — J984 Other disorders of lung: Secondary | ICD-10-CM | POA: Diagnosis not present

## 2023-01-16 DIAGNOSIS — B4481 Allergic bronchopulmonary aspergillosis: Secondary | ICD-10-CM | POA: Insufficient documentation

## 2023-01-16 MED ORDER — ALBUTEROL SULFATE HFA 108 (90 BASE) MCG/ACT IN AERS: 2.0000 | INHALATION_SPRAY | Freq: Four times a day (QID) | RESPIRATORY_TRACT | 6 refills | Status: AC | PRN

## 2023-01-16 MED ORDER — ALBUTEROL SULFATE (2.5 MG/3ML) 0.083% IN NEBU: 2.5000 mg | INHALATION_SOLUTION | Freq: Four times a day (QID) | RESPIRATORY_TRACT | 12 refills | Status: AC | PRN

## 2023-01-16 MED ORDER — TRELEGY ELLIPTA 200-62.5-25 MCG/ACT IN AEPB: 1.0000 | INHALATION_SPRAY | Freq: Every day | RESPIRATORY_TRACT | 6 refills | Status: DC

## 2023-01-16 NOTE — Patient Instructions (Addendum)
Continue on Trelegy 1 puff daily, rinse after use.  Albuterol inhaler or neb As needed   I will be touch tomorrow regarding new medications.  Work on not smoking.  CT chest next month as planned.  Follow up with Dr. Vassie Loll  in 6 weeks in [place] office and As needed   Please contact office for sooner follow up if symptoms do not improve or worsen or seek emergency care

## 2023-01-16 NOTE — Assessment & Plan Note (Signed)
Right-sided cavitary lesions with Aspergillus, ABPA Patient has been unable to tolerate antifungal therapy with posaconazole.  Will change to Voriconazole 200mg  Twice daily . CT chest planned for next month. High risk for malignancy , continue to monitor serial CT chest

## 2023-01-16 NOTE — Progress Notes (Unsigned)
@Patient  ID: Teresa Edwards, female    DOB: 05-27-1951, 72 y.o.   MRN: [id number]  Chief Complaint  Patient presents with   Follow-up    Referring provider: Donetta Potts, MD  HPI: 72 year old female smoker followed for COPD and Cavitary lung nodules , chronic pulmonary aspergillosis   TEST/EVENTS :  CT chest June 19, 2022 waxing and waning areas of consolidation in lungs bilaterally with some internal cavitation/necrosis  CT chest September 24, 2022 improving right lower lobe opacity/consolidation with residual 3.5 x 1.6 cm cavitary nodule, thick-walled cavitary nodules in the right middle and lower lobe.  Nav Bronchoscopy June 22, 2022 negative for malignant cells.  Positive for Aspergillus BAL Neg .  Fungal Cx positive for Aspergillus fumigatus  Absolute eosinophil count October 18, 2022 (400), Aspergillus IgE panel positive, IgE 2813  01/16/23  Follow up : COPD, cavitary lung nodules, chronic pulmonary aspergillosis Patient returns for 8-month follow-up.  Patient has underlying COPD cavitary lung nodules and chronic pulmonary aspergillus.  Patient underwent bronchoscopy June 22, 2022.  Fungal cultures came back positive for Aspergillus fumigatus. She was treated with Posaconazole. Started early January. Dose increased in Early April but unable to tolerate. Stopped posaconazole for last few weeks.   CT chest September 24, 2022 did show decreased right lower lobe cavitary nodule.  Lab work last visit showed high IgE at 2813, elevated eosinophil count at 400 and positive Aspergillus IgE panel.  This appears consistent with ABPA.  We discussed her test findings. She remains on Trelegy inhaler daily . Continues to smoke. Discussed smoking cessation. Has minimal cough , sometimes Is productive, will cough up black specks mixed in mucus. No blood. Appetite is fair. Weight is down 10lbs.  Not active gets winded with minimal activity. Sedentary lifestyle.   No Known  Allergies  Immunization History  Administered Date(s) Administered   Td (Adult),5 Lf Tetanus Toxid, Preservative Free 06/21/2008   Tdap 06/21/2008    Past Medical History:  Diagnosis Date   Aortic atherosclerosis (HCC)    Cavitary lung disease    Chronic pain    COPD (chronic obstructive pulmonary disease) (HCC)    Coronary artery calcification seen on CT scan    DDD (degenerative disc disease)    DVT femoral (deep venous thrombosis) with thrombophlebitis (HCC)    Dyspnea    Pt states she has had for months   Hypertension     Tobacco History: Social History   Tobacco Use  Smoking Status Every Day   Packs/day: 3.00   Years: 40.00   Additional pack years: 0.00   Total pack years: 120.00   Types: Cigarettes  Smokeless Tobacco Never  Tobacco Comments   Smoking about 1 ppd.  She says she cuts back.  01/16/2023 hfb   Ready to quit: Not Answered Counseling given: Not Answered Tobacco comments: Smoking about 1 ppd.  She says she cuts back.  01/16/2023 hfb   Outpatient Medications Prior to Visit  Medication Sig Dispense Refill   diltiazem (CARDIZEM CD) 180 MG 24 hr capsule Take 180 mg by mouth daily.     Fluticasone-Umeclidin-Vilant (TRELEGY ELLIPTA) 200-62.5-25 MCG/ACT AEPB Inhale 1 puff into the lungs daily. 14 each 0   losartan (COZAAR) 25 MG tablet Take 25 mg by mouth daily.     oxycodone (ROXICODONE) 30 MG immediate release tablet Take 15-30 mg by mouth every 4 (four) hours as needed for pain.      albuterol (PROVENTIL HFA;VENTOLIN HFA) 108 (90 Base) MCG/ACT inhaler  Inhale 2 puffs into the lungs every 6 (six) hours as needed for wheezing or shortness of breath. 1 Inhaler 2   albuterol (PROVENTIL) (2.5 MG/3ML) 0.083% nebulizer solution Take 3 mLs (2.5 mg total) by nebulization every 6 (six) hours as needed for wheezing or shortness of breath. 75 mL 12   posaconazole (NOXAFIL) 100 MG TBEC delayed-release tablet Take 3 tablets (300 mg total) by mouth daily. 120 tablet 4   No  facility-administered medications prior to visit.     Review of Systems:   Constitutional:   No  weight loss, night sweats,  Fevers, chills, + fatigue, or  lassitude.  HEENT:   No headaches,  Difficulty swallowing,  Tooth/dental problems, or  Sore throat,                No sneezing, itching, ear ache, nasal congestion, post nasal drip,   CV:  No chest pain,  Orthopnea, PND, swelling in lower extremities, anasarca, dizziness, palpitations, syncope.   GI  No heartburn, indigestion, abdominal pain, nausea, vomiting, diarrhea, change in bowel habits, loss of appetite, bloody stools.   Resp: N.  No chest wall deformity  Skin: no rash or lesions.  GU: no dysuria, change in color of urine, no urgency or frequency.  No flank pain, no hematuria   MS:  No joint pain or swelling.  No decreased range of motion.  No back pain.    Physical Exam  BP 126/60 (BP Location: Left Arm, Patient Position: Sitting, Cuff Size: Normal)   Pulse 73   Temp 97.6 F (36.4 C) (Oral)   Ht 5\' 4"  (1.626 m)   Wt 109 lb 6.4 oz (49.6 kg)   SpO2 95%   BMI 18.78 kg/m   GEN: A/Ox3; pleasant , NAD, frail and elderly    HEENT:  [place]/AT,  EACs-clear, TMs-wnl, NOSE-clear, THROAT-clear, no lesions, no postnasal drip or exudate noted.   NECK:  Supple w/ fair ROM; no JVD; normal carotid impulses w/o bruits; no thyromegaly or nodules palpated; no lymphadenopathy.    RESP  decreased BS in bases  no accessory muscle use, no dullness to percussion  CARD:  RRR, no m/r/g, no peripheral edema, pulses intact, no cyanosis or clubbing.  GI:   Soft & nt; nml bowel sounds; no organomegaly or masses detected.   Musco: Warm bil, no deformities or joint swelling noted.   Neuro: alert, no focal deficits noted.    Skin: Warm, no lesions or rashes    Lab Results:  CBC    Component Value Date/Time   WBC 7.6 10/18/2022 1009   RBC 4.43 10/18/2022 1009   HGB 13.5 10/18/2022 1009   HCT 41.3 10/18/2022 1009   PLT 258.0  10/18/2022 1009   MCV 93.3 10/18/2022 1009   MCH 31.9 06/22/2022 0546   MCHC 32.8 10/18/2022 1009   RDW 16.3 (H) 10/18/2022 1009   LYMPHSABS 1.9 10/18/2022 1009   MONOABS 0.7 10/18/2022 1009   EOSABS 0.4 10/18/2022 1009   BASOSABS 0.1 10/18/2022 1009    BMET    Component Value Date/Time   NA 143 07/24/2022 1133   K 3.6 07/24/2022 1133   CL 106 07/24/2022 1133   CO2 27 07/24/2022 1133   GLUCOSE 89 07/24/2022 1133   BUN 25 (H) 07/24/2022 1133   CREATININE 0.79 07/24/2022 1133   CALCIUM 9.3 07/24/2022 1133   GFRNONAA >60 06/22/2022 0546   GFRAA >60 08/01/2017 1012    BNP No results found for: "BNP"  ProBNP No  results found for: "PROBNP"  Imaging: No results found.        No data to display          No results found for: "NITRICOXIDE"      Assessment & Plan:   Cavitary lung disease Right-sided cavitary lesions with Aspergillus, ABPA Patient has been unable to tolerate antifungal therapy with posaconazole.  Will change to Itraconazole 200mg  daily.  . CT chest planned for next month. High risk for malignancy , continue to monitor serial CT chest   Plan  Patient Instructions  Begin Itraconazole 200mg  daily, take with food. (Can interact with other medications, especially pain medications. Discuss with doctor/pharmacist regarding your pain meds) .  Continue on Trelegy 1 puff daily, rinse after use.  Albuterol inhaler or neb As needed   Work on not smoking.  CT chest next month as planned.  LFT lab  Follow up with Chealsey Miyamoto NP or Dr. Vassie Loll  in 3-4 weeks and As needed   Follow up with Dr. Vassie Loll  in 6-8  weeks in [employer] office and As needed   Please contact office for sooner follow up if symptoms do not improve or worsen or seek emergency care       ABPA (allergic bronchopulmonary aspergillosis) [facility]) Bronchoscopy June 22, 2022 fungal culture was positive for Aspergillus fumigatus.  IgE very high at 2813, elevated eosinophil count, Aspergillus IgE panel  elevated,  along with waxing and waning lung nodules and cavitary lesions.  Will restart antifungal therapy , hold on systemic steroid therapy for now . Continue on Triple inhaler therapy with ICS. Close follow up. May need to consider steroids +/- biologic therapy going forward.  CT next month as planned.  Discussed case with Dr. Jayme Cloud.   Plan  Patient Instructions  Begin Itraconazole 200mg  daily, take with food. (Can interact with other medications, especially pain medications. Discuss with doctor/pharmacist regarding your pain meds) .  Continue on Trelegy 1 puff daily, rinse after use.  Albuterol inhaler or neb As needed   Work on not smoking.  CT chest next month as planned.  LFT lab  Follow up with Konstantin Lehnen NP or Dr. Vassie Loll  in 3-4 weeks and As needed   Follow up with Dr. Vassie Loll  in 6-8  weeks in [place] office and As needed   Please contact office for sooner follow up if symptoms do not improve or worsen or seek emergency care         I spent 46   minutes dedicated to the care of this patient on the date of this encounter to include pre-visit review of records, face-to-face time with the patient discussing conditions above, post visit ordering of testing, clinical documentation with the electronic health record, making appropriate referrals as documented, and communicating necessary findings to members of the patients care team.   Rubye Oaks, NP 01/16/23

## 2023-01-16 NOTE — Assessment & Plan Note (Addendum)
Bronchoscopy June 22, 2022 fungal culture was positive for Aspergillus fumigatus.  IgE very high at 2813, elevated eosinophil count, Aspergillus IgE panel elevated,  along with waxing and waning lung nodules and cavitary lesions.  Will restart antifungal therapy , hold on systemic steroid therapy for now . Continue on Triple inhaler therapy with ICS. Close follow up. May need to consider steroids +/- biologic therapy going forward.  CT next month as planned.  Discussed case with Dr. Jayme Cloud.   Plan  Patient Instructions  Begin Itraconazole 200mg  daily, take with food. (Can interact with other medications, especially pain medications. Discuss with doctor/pharmacist regarding your pain meds) .  Continue on Trelegy 1 puff daily, rinse after use.  Albuterol inhaler or neb As needed   Work on not smoking.  CT chest next month as planned.  LFT lab  Follow up with Rocklyn Mayberry NP or Dr. Vassie Loll  in 3-4 weeks and As needed   Follow up with Dr. Vassie Loll  in 6-8  weeks in [place] office and As needed   Please contact office for sooner follow up if symptoms do not improve or worsen or seek emergency care

## 2023-01-17 ENCOUNTER — Telehealth: Payer: Self-pay | Admitting: Adult Health

## 2023-01-17 DIAGNOSIS — J449 Chronic obstructive pulmonary disease, unspecified: Secondary | ICD-10-CM | POA: Diagnosis not present

## 2023-01-17 DIAGNOSIS — Z79899 Other long term (current) drug therapy: Secondary | ICD-10-CM | POA: Diagnosis not present

## 2023-01-17 DIAGNOSIS — R030 Elevated blood-pressure reading, without diagnosis of hypertension: Secondary | ICD-10-CM | POA: Diagnosis not present

## 2023-01-17 DIAGNOSIS — Z9181 History of falling: Secondary | ICD-10-CM | POA: Diagnosis not present

## 2023-01-17 DIAGNOSIS — G8929 Other chronic pain: Secondary | ICD-10-CM | POA: Diagnosis not present

## 2023-01-17 DIAGNOSIS — M539 Dorsopathy, unspecified: Secondary | ICD-10-CM | POA: Diagnosis not present

## 2023-01-17 DIAGNOSIS — Z681 Body mass index (BMI) 19 or less, adult: Secondary | ICD-10-CM | POA: Diagnosis not present

## 2023-01-17 DIAGNOSIS — M545 Low back pain, unspecified: Secondary | ICD-10-CM | POA: Diagnosis not present

## 2023-01-17 DIAGNOSIS — E1142 Type 2 diabetes mellitus with diabetic polyneuropathy: Secondary | ICD-10-CM | POA: Diagnosis not present

## 2023-01-17 MED ORDER — ITRACONAZOLE 100 MG PO CAPS: 200.0000 mg | ORAL_CAPSULE | Freq: Every day | ORAL | 2 refills | Status: DC

## 2023-01-17 NOTE — Telephone Encounter (Signed)
Teresa Edwards daughter is returning phone call. Teresa Edwards phone number is [phone].

## 2023-01-17 NOTE — Progress Notes (Signed)
Agree with the details of the visit as noted by Tammy Parrett, NP.  C. Laura Euphemia Lingerfelt, MD [facility] 

## 2023-01-17 NOTE — Telephone Encounter (Signed)
Called and spoke with patient, she asked that I call her daugher, Camelia Eng to set up f/u with Tammy.  Called and spoke with Terri (DPR), scheduled to see Tammy on 7/25 at 11:30 am, advised to arrive by 11:15 am for check in.  Advised that she needs to have lab work drawn to check liver function prior to starting the antifungal.  She would like for her to have the blood drawn at [facility]  Advised that would be fine and lab changed to [facility].  Discussed that there is a potential interaction between antifungal and pain medication and she needs to discuss this with her pain management doctor.  She verbalized understanding.  Nothing further needed.

## 2023-01-17 NOTE — Addendum Note (Signed)
Addended by: Delrae Rend on: 01/17/2023 12:26 PM   Modules accepted: Orders

## 2023-01-22 ENCOUNTER — Other Ambulatory Visit (HOSPITAL_COMMUNITY)
Admission: RE | Admit: 2023-01-22 | Discharge: 2023-01-22 | Disposition: A | Discharge status: Home / Self Care | Payer: 59 | Source: Ambulatory Visit | Attending: Adult Health | Admitting: Adult Health

## 2023-01-22 ENCOUNTER — Telehealth: Payer: Self-pay

## 2023-01-22 DIAGNOSIS — B441 Other pulmonary aspergillosis: Secondary | ICD-10-CM

## 2023-01-22 DIAGNOSIS — J984 Other disorders of lung: Secondary | ICD-10-CM | POA: Diagnosis not present

## 2023-01-22 DIAGNOSIS — B4481 Allergic bronchopulmonary aspergillosis: Secondary | ICD-10-CM | POA: Insufficient documentation

## 2023-01-22 LAB — HEPATIC FUNCTION PANEL
ALT: 12 U/L (ref 0–44)
AST: 21 U/L (ref 15–41)
Albumin: 3.5 g/dL (ref 3.5–5.0)
Alkaline Phosphatase: 67 U/L (ref 38–126)
Bilirubin, Direct: <0.1 mg/dL (ref 0.0–0.2)
Total Bilirubin: 0.2 mg/dL — ABNORMAL LOW (ref 0.3–1.2)
Total Protein: 6.9 g/dL (ref 6.5–8.1)

## 2023-01-22 NOTE — Telephone Encounter (Signed)
-----   Message from Julio Sicks, NP sent at 01/22/2023  2:43 PM EDT ----- Please refer to ID for chronic pulmonary aspergillosis  ----- Message ----- From: Oretha Milch, MD Sent: 01/22/2023   2:29 PM EDT To: Julio Sicks, NP  Would refer her to ID Not sure about duration of treatment. Not ABPA in my opinion (no bronchiectasis/asthma) but has aspergillus infection with cavitary nodules  Kathy Breach ----- Message ----- From: Julio Sicks, NP Sent: 01/17/2023  12:23 PM EDT To: Oretha Milch, MD; Salena Saner, MD

## 2023-01-22 NOTE — Telephone Encounter (Signed)
ATC patient--unable to leave vm due to mailbox not being setup.  °Will call back.  ° °

## 2023-01-23 NOTE — Telephone Encounter (Signed)
Patient is aware of below message and voiced her understanding.  ID referral placed. Nothing further needed.

## 2023-01-23 NOTE — Telephone Encounter (Signed)
ATC x2-unable to leave vm due to mailbox not being setup. Will call once more due to nature of call.

## 2023-01-31 ENCOUNTER — Telehealth: Payer: Self-pay | Admitting: Adult Health

## 2023-01-31 NOTE — Telephone Encounter (Signed)
Patient states insurance needs more information for medications. Patient does not know which medications. Patient phone number is [phone].

## 2023-02-01 ENCOUNTER — Other Ambulatory Visit (HOSPITAL_COMMUNITY): Payer: Self-pay

## 2023-02-01 ENCOUNTER — Telehealth: Payer: Self-pay

## 2023-02-01 NOTE — Telephone Encounter (Signed)
*  Pulm  PA request received for Itraconazole 100MG  capsules  PA submitted to [employer] Part D via CMM and is pending additional questions/determination  Key: [id number]

## 2023-02-01 NOTE — Telephone Encounter (Signed)
PA has been APPROVED from 02/01/2023-07/17/2023

## 2023-02-07 NOTE — Telephone Encounter (Signed)
Per message from 02/01/2023- Itraconazole 100MG  was approved by her insurance on 02/01/2023.  I have notified the patient. She will call her pharmacy and go pick up the medication.  Nothing further needed.

## 2023-02-08 ENCOUNTER — Ambulatory Visit: Payer: 59 | Admitting: Adult Health

## 2023-02-15 DIAGNOSIS — Z681 Body mass index (BMI) 19 or less, adult: Secondary | ICD-10-CM | POA: Diagnosis not present

## 2023-02-15 DIAGNOSIS — M539 Dorsopathy, unspecified: Secondary | ICD-10-CM | POA: Diagnosis not present

## 2023-02-15 DIAGNOSIS — Z9181 History of falling: Secondary | ICD-10-CM | POA: Diagnosis not present

## 2023-02-15 DIAGNOSIS — G894 Chronic pain syndrome: Secondary | ICD-10-CM | POA: Diagnosis not present

## 2023-02-15 DIAGNOSIS — J449 Chronic obstructive pulmonary disease, unspecified: Secondary | ICD-10-CM | POA: Diagnosis not present

## 2023-02-15 DIAGNOSIS — M545 Low back pain, unspecified: Secondary | ICD-10-CM | POA: Diagnosis not present

## 2023-02-15 DIAGNOSIS — E1142 Type 2 diabetes mellitus with diabetic polyneuropathy: Secondary | ICD-10-CM | POA: Diagnosis not present

## 2023-02-15 DIAGNOSIS — Z79899 Other long term (current) drug therapy: Secondary | ICD-10-CM | POA: Diagnosis not present

## 2023-02-15 DIAGNOSIS — G8929 Other chronic pain: Secondary | ICD-10-CM | POA: Diagnosis not present

## 2023-03-01 ENCOUNTER — Ambulatory Visit: Payer: 59 | Admitting: Pulmonary Disease

## 2023-03-05 ENCOUNTER — Ambulatory Visit (HOSPITAL_COMMUNITY)
Admission: RE | Admit: 2023-03-05 | Discharge: 2023-03-05 | Disposition: A | Discharge status: Home / Self Care | Payer: 59 | Source: Ambulatory Visit | Attending: Student | Admitting: Student

## 2023-03-05 DIAGNOSIS — R918 Other nonspecific abnormal finding of lung field: Secondary | ICD-10-CM | POA: Diagnosis not present

## 2023-03-05 DIAGNOSIS — J439 Emphysema, unspecified: Secondary | ICD-10-CM | POA: Diagnosis not present

## 2023-03-05 DIAGNOSIS — B441 Other pulmonary aspergillosis: Secondary | ICD-10-CM | POA: Diagnosis not present

## 2023-03-05 DIAGNOSIS — I700 Atherosclerosis of aorta: Secondary | ICD-10-CM | POA: Diagnosis not present

## 2023-03-16 DIAGNOSIS — J449 Chronic obstructive pulmonary disease, unspecified: Secondary | ICD-10-CM | POA: Diagnosis not present

## 2023-03-16 DIAGNOSIS — Z9181 History of falling: Secondary | ICD-10-CM | POA: Diagnosis not present

## 2023-03-16 DIAGNOSIS — E1142 Type 2 diabetes mellitus with diabetic polyneuropathy: Secondary | ICD-10-CM | POA: Diagnosis not present

## 2023-03-16 DIAGNOSIS — G8929 Other chronic pain: Secondary | ICD-10-CM | POA: Diagnosis not present

## 2023-03-16 DIAGNOSIS — Z681 Body mass index (BMI) 19 or less, adult: Secondary | ICD-10-CM | POA: Diagnosis not present

## 2023-03-16 DIAGNOSIS — M545 Low back pain, unspecified: Secondary | ICD-10-CM | POA: Diagnosis not present

## 2023-03-16 DIAGNOSIS — M539 Dorsopathy, unspecified: Secondary | ICD-10-CM | POA: Diagnosis not present

## 2023-03-16 DIAGNOSIS — Z79899 Other long term (current) drug therapy: Secondary | ICD-10-CM | POA: Diagnosis not present

## 2023-03-22 DIAGNOSIS — Z79899 Other long term (current) drug therapy: Secondary | ICD-10-CM | POA: Diagnosis not present

## 2023-03-23 ENCOUNTER — Ambulatory Visit (INDEPENDENT_AMBULATORY_CARE_PROVIDER_SITE_OTHER): Payer: 59 | Admitting: Pulmonary Disease

## 2023-03-23 ENCOUNTER — Encounter: Payer: Self-pay | Admitting: Pulmonary Disease

## 2023-03-23 VITALS — BP 126/63 | HR 61 | Ht 64.0 in | Wt 113.8 lb

## 2023-03-23 DIAGNOSIS — J9611 Chronic respiratory failure with hypoxia: Secondary | ICD-10-CM

## 2023-03-23 DIAGNOSIS — B440 Invasive pulmonary aspergillosis: Secondary | ICD-10-CM

## 2023-03-23 DIAGNOSIS — J441 Chronic obstructive pulmonary disease with (acute) exacerbation: Secondary | ICD-10-CM

## 2023-03-23 DIAGNOSIS — Z720 Tobacco use: Secondary | ICD-10-CM | POA: Diagnosis not present

## 2023-03-23 NOTE — Assessment & Plan Note (Signed)
Smoking cessation was emphasized is the most important intervention 

## 2023-03-23 NOTE — Assessment & Plan Note (Signed)
Oxygen saturation on arrival today was 88%. She did not desaturate on ambulation slow pace.

## 2023-03-23 NOTE — Patient Instructions (Signed)
X refer to Infectious disease  X Blood work - CBC, CMET

## 2023-03-23 NOTE — Assessment & Plan Note (Signed)
Continue Trelegy. Use albuterol for rescue We discussed action plan for COPD and signs and symptoms of COPD exacerbation

## 2023-03-23 NOTE — Progress Notes (Signed)
   Subjective:    Patient ID: Teresa Edwards, female    DOB: 1950-09-30, 72 y.o.   MRN: [id number]  HPI  72 year old female smoker followed for COPD and Cavitary lung nodules , chronic pulmonary aspergillosis  Heavy smoker more than 120 pack years also exposure to cotton dust  She was treated with Posaconazole. Started early January. Dose increased in Early April but unable to tolerate due to increased sleepiness. Stopped posaconazole 12/2022 >> started on itraconazole on OV 01/2023   Previously seen by my partner Dr. Thora Lance, now establishing with me. Last OV with APP 01/2023. She has been able to obtain itraconazole and compliant with this.  We reviewed CT chest which shows stable to decrease in size of cavitary nodules. She continues to smoke 1 pack/day. Weight remains stable  Oxygen saturation on arrival was 88%    Significant tests/ events reviewed  CT chest June 19, 2022 waxing and waning areas of consolidation in lungs bilaterally with some internal cavitation/necrosis   CT chest September 24, 2022 improving right lower lobe opacity/consolidation with residual 3.5 x 1.6 cm cavitary nodule, thick-walled cavitary nodules in the right middle and lower lobe.  Ct chest 02/2023 Multiple cavitary/solid nodules in the right lung, as above, overall improved.   Nav Bronchoscopy June 22, 2022 negative for malignant cells.  Positive for Aspergillus BAL Neg .  Fungal Cx positive for Aspergillus fumigatus   Absolute eosinophil count October 18, 2022 (400), Aspergillus IgE panel positive, IgE 2813  Review of Systems neg for any significant sore throat, dysphagia, itching, sneezing, nasal congestion or excess/ purulent secretions, fever, chills, sweats, unintended wt loss, pleuritic or exertional cp, hempoptysis, orthopnea pnd or change in chronic leg swelling. Also denies presyncope, palpitations, heartburn, abdominal pain, nausea, vomiting, diarrhea or change in bowel or urinary habits,  dysuria,hematuria, rash, arthralgias, visual complaints, headache, numbness weakness or ataxia.     Objective:   Physical Exam  Gen. Pleasant, thin, frail, elderly woman, in no distress ENT - no thrush, no pallor/icterus,no post nasal drip Neck: No JVD, no thyromegaly, no carotid bruits Lungs: no use of accessory muscles, no dullness to percussion, decreased breath sounds bilateral Cardiovascular: Rhythm regular, heart sounds  normal, no murmurs or gallops, no peripheral edema Musculoskeletal: No deformities, no cyanosis or clubbing        Assessment & Plan:

## 2023-03-23 NOTE — Assessment & Plan Note (Addendum)
Continue itraconazole.  This seems to be helping, of the  4 cavitary nodules 2 are stable and 2 have decreased in size. She seems to be tolerating well. We will check LFTs and electrolytes to rule out hypokalemia Refills will be provided as needed for itraconazole. Recommended infectious disease consult We will repeat imaging in 6 months

## 2023-04-13 DIAGNOSIS — J449 Chronic obstructive pulmonary disease, unspecified: Secondary | ICD-10-CM | POA: Diagnosis not present

## 2023-04-13 DIAGNOSIS — G8929 Other chronic pain: Secondary | ICD-10-CM | POA: Diagnosis not present

## 2023-04-13 DIAGNOSIS — Z9181 History of falling: Secondary | ICD-10-CM | POA: Diagnosis not present

## 2023-04-13 DIAGNOSIS — M539 Dorsopathy, unspecified: Secondary | ICD-10-CM | POA: Diagnosis not present

## 2023-04-13 DIAGNOSIS — Z79899 Other long term (current) drug therapy: Secondary | ICD-10-CM | POA: Diagnosis not present

## 2023-04-13 DIAGNOSIS — Z681 Body mass index (BMI) 19 or less, adult: Secondary | ICD-10-CM | POA: Diagnosis not present

## 2023-04-13 DIAGNOSIS — E1142 Type 2 diabetes mellitus with diabetic polyneuropathy: Secondary | ICD-10-CM | POA: Diagnosis not present

## 2023-04-13 DIAGNOSIS — M545 Low back pain, unspecified: Secondary | ICD-10-CM | POA: Diagnosis not present

## 2023-04-17 DIAGNOSIS — Z79899 Other long term (current) drug therapy: Secondary | ICD-10-CM | POA: Diagnosis not present

## 2023-05-15 DIAGNOSIS — Z79899 Other long term (current) drug therapy: Secondary | ICD-10-CM | POA: Diagnosis not present

## 2023-05-15 DIAGNOSIS — Z9181 History of falling: Secondary | ICD-10-CM | POA: Diagnosis not present

## 2023-05-15 DIAGNOSIS — E1142 Type 2 diabetes mellitus with diabetic polyneuropathy: Secondary | ICD-10-CM | POA: Diagnosis not present

## 2023-05-15 DIAGNOSIS — Z681 Body mass index (BMI) 19 or less, adult: Secondary | ICD-10-CM | POA: Diagnosis not present

## 2023-05-15 DIAGNOSIS — M539 Dorsopathy, unspecified: Secondary | ICD-10-CM | POA: Diagnosis not present

## 2023-05-15 DIAGNOSIS — J449 Chronic obstructive pulmonary disease, unspecified: Secondary | ICD-10-CM | POA: Diagnosis not present

## 2023-06-01 DIAGNOSIS — E876 Hypokalemia: Secondary | ICD-10-CM | POA: Diagnosis not present

## 2023-06-01 DIAGNOSIS — E559 Vitamin D deficiency, unspecified: Secondary | ICD-10-CM | POA: Diagnosis not present

## 2023-06-01 DIAGNOSIS — E039 Hypothyroidism, unspecified: Secondary | ICD-10-CM | POA: Diagnosis not present

## 2023-06-01 DIAGNOSIS — E781 Pure hyperglyceridemia: Secondary | ICD-10-CM | POA: Diagnosis not present

## 2023-06-01 DIAGNOSIS — R739 Hyperglycemia, unspecified: Secondary | ICD-10-CM | POA: Diagnosis not present

## 2023-06-15 DIAGNOSIS — Z681 Body mass index (BMI) 19 or less, adult: Secondary | ICD-10-CM | POA: Diagnosis not present

## 2023-06-15 DIAGNOSIS — E1142 Type 2 diabetes mellitus with diabetic polyneuropathy: Secondary | ICD-10-CM | POA: Diagnosis not present

## 2023-06-15 DIAGNOSIS — Z9181 History of falling: Secondary | ICD-10-CM | POA: Diagnosis not present

## 2023-06-15 DIAGNOSIS — Z79899 Other long term (current) drug therapy: Secondary | ICD-10-CM | POA: Diagnosis not present

## 2023-06-15 DIAGNOSIS — M539 Dorsopathy, unspecified: Secondary | ICD-10-CM | POA: Diagnosis not present

## 2023-06-15 DIAGNOSIS — J449 Chronic obstructive pulmonary disease, unspecified: Secondary | ICD-10-CM | POA: Diagnosis not present

## 2023-06-19 DIAGNOSIS — R7303 Prediabetes: Secondary | ICD-10-CM | POA: Diagnosis not present

## 2023-06-19 DIAGNOSIS — J449 Chronic obstructive pulmonary disease, unspecified: Secondary | ICD-10-CM | POA: Diagnosis not present

## 2023-06-19 DIAGNOSIS — F17210 Nicotine dependence, cigarettes, uncomplicated: Secondary | ICD-10-CM | POA: Diagnosis not present

## 2023-06-19 DIAGNOSIS — I10 Essential (primary) hypertension: Secondary | ICD-10-CM | POA: Diagnosis not present

## 2023-06-19 DIAGNOSIS — Z681 Body mass index (BMI) 19 or less, adult: Secondary | ICD-10-CM | POA: Diagnosis not present

## 2023-06-19 DIAGNOSIS — B449 Aspergillosis, unspecified: Secondary | ICD-10-CM | POA: Diagnosis not present

## 2023-07-02 DIAGNOSIS — B441 Other pulmonary aspergillosis: Secondary | ICD-10-CM | POA: Diagnosis not present

## 2023-07-02 DIAGNOSIS — R918 Other nonspecific abnormal finding of lung field: Secondary | ICD-10-CM | POA: Diagnosis not present

## 2023-07-02 DIAGNOSIS — J439 Emphysema, unspecified: Secondary | ICD-10-CM | POA: Diagnosis not present

## 2023-07-16 DIAGNOSIS — Z79899 Other long term (current) drug therapy: Secondary | ICD-10-CM | POA: Diagnosis not present

## 2023-07-16 DIAGNOSIS — M539 Dorsopathy, unspecified: Secondary | ICD-10-CM | POA: Diagnosis not present

## 2023-07-16 DIAGNOSIS — M545 Low back pain, unspecified: Secondary | ICD-10-CM | POA: Diagnosis not present

## 2023-07-16 DIAGNOSIS — Z681 Body mass index (BMI) 19 or less, adult: Secondary | ICD-10-CM | POA: Diagnosis not present

## 2023-07-16 DIAGNOSIS — G8929 Other chronic pain: Secondary | ICD-10-CM | POA: Diagnosis not present

## 2023-07-23 DIAGNOSIS — Z79899 Other long term (current) drug therapy: Secondary | ICD-10-CM | POA: Diagnosis not present

## 2023-07-27 ENCOUNTER — Telehealth: Payer: Self-pay | Admitting: Pulmonary Disease

## 2023-07-27 NOTE — Telephone Encounter (Signed)
 After Hours Triage  Received call from patient's PCP Dr. Vaughn Pouch regarding a CT Chest from [facility] on 07/02/23.  1. New masslike consolidation in the peripheral right lower lobe  measuring 2.8 x 2.1 cm. New masslike consolidation in the dependent  right lower lobe measuring 2.3 x 1.0 cm.  2. Slight enlargement of a previously cavitary, now solid-appearing  nodule in the inferior peripheral right lower lobe measuring.  Interval increase in size and complexity of a cavitary lesion in the  right upper lobe.  3. Similar size of additional smaller nodules in the right upper  lobe.  4. Constellation of findings is consistent with worsened pulmonary  aspergillosis involving the right lung.  5. Severe emphysema and diffuse bilateral bronchial wall thickening.  6. Coronary artery disease.   She is a former patient of Dr. Gladis. She has upcoming appointment with Dr. Annella in February but I think it would be best if she follow with Dr. Shelah as she may benefit from navigational bronchosocpy for this new RLL mass like opacity. Please schedule with Dr. Shelah this month if able.  Thanks  Dorn Chill, MD [facility] Office: [phone]   See Amion for personal pager PCCM on call pager [phone] until 7pm. Please call Elink 7p-7a. [phone]

## 2023-07-28 NOTE — Telephone Encounter (Signed)
 Next opening with Dr. Delton Coombes is not until 1/29 at 1:00 which needs approval from Dr. Delton Coombes prior to scheduling. Dr. Delton Coombes, please advise on this.

## 2023-07-30 NOTE — Telephone Encounter (Signed)
 Yes, ok to schedule here or sooner in any nodule slot or blocked slot

## 2023-07-30 NOTE — Telephone Encounter (Signed)
 NFN

## 2023-08-16 DIAGNOSIS — Z0000 Encounter for general adult medical examination without abnormal findings: Secondary | ICD-10-CM | POA: Diagnosis not present

## 2023-08-16 DIAGNOSIS — M539 Dorsopathy, unspecified: Secondary | ICD-10-CM | POA: Diagnosis not present

## 2023-08-16 DIAGNOSIS — Z79899 Other long term (current) drug therapy: Secondary | ICD-10-CM | POA: Diagnosis not present

## 2023-08-16 DIAGNOSIS — M129 Arthropathy, unspecified: Secondary | ICD-10-CM | POA: Diagnosis not present

## 2023-08-16 DIAGNOSIS — Z681 Body mass index (BMI) 19 or less, adult: Secondary | ICD-10-CM | POA: Diagnosis not present

## 2023-08-16 DIAGNOSIS — M545 Low back pain, unspecified: Secondary | ICD-10-CM | POA: Diagnosis not present

## 2023-08-16 DIAGNOSIS — G8929 Other chronic pain: Secondary | ICD-10-CM | POA: Diagnosis not present

## 2023-08-16 DIAGNOSIS — R5383 Other fatigue: Secondary | ICD-10-CM | POA: Diagnosis not present

## 2023-08-16 DIAGNOSIS — E7800 Pure hypercholesterolemia, unspecified: Secondary | ICD-10-CM | POA: Diagnosis not present

## 2023-08-16 DIAGNOSIS — Z131 Encounter for screening for diabetes mellitus: Secondary | ICD-10-CM | POA: Diagnosis not present

## 2023-08-19 ENCOUNTER — Other Ambulatory Visit: Payer: Self-pay

## 2023-08-19 ENCOUNTER — Encounter (HOSPITAL_COMMUNITY): Payer: Self-pay | Admitting: *Deleted

## 2023-08-19 ENCOUNTER — Emergency Department (HOSPITAL_COMMUNITY): Payer: 59

## 2023-08-19 ENCOUNTER — Inpatient Hospital Stay (HOSPITAL_COMMUNITY)
Admission: EM | Admit: 2023-08-19 | Discharge: 2023-08-21 | DRG: 193 | Disposition: A | Discharge status: Home / Self Care | Payer: 59 | Attending: Internal Medicine | Admitting: Internal Medicine

## 2023-08-19 DIAGNOSIS — R112 Nausea with vomiting, unspecified: Secondary | ICD-10-CM | POA: Diagnosis not present

## 2023-08-19 DIAGNOSIS — J9601 Acute respiratory failure with hypoxia: Secondary | ICD-10-CM | POA: Diagnosis not present

## 2023-08-19 DIAGNOSIS — J441 Chronic obstructive pulmonary disease with (acute) exacerbation: Secondary | ICD-10-CM | POA: Diagnosis not present

## 2023-08-19 DIAGNOSIS — Z8249 Family history of ischemic heart disease and other diseases of the circulatory system: Secondary | ICD-10-CM | POA: Diagnosis not present

## 2023-08-19 DIAGNOSIS — B441 Other pulmonary aspergillosis: Secondary | ICD-10-CM | POA: Diagnosis present

## 2023-08-19 DIAGNOSIS — J189 Pneumonia, unspecified organism: Secondary | ICD-10-CM | POA: Diagnosis not present

## 2023-08-19 DIAGNOSIS — I10 Essential (primary) hypertension: Secondary | ICD-10-CM | POA: Diagnosis not present

## 2023-08-19 DIAGNOSIS — Z79899 Other long term (current) drug therapy: Secondary | ICD-10-CM

## 2023-08-19 DIAGNOSIS — G9341 Metabolic encephalopathy: Secondary | ICD-10-CM | POA: Diagnosis present

## 2023-08-19 DIAGNOSIS — Z681 Body mass index (BMI) 19 or less, adult: Secondary | ICD-10-CM

## 2023-08-19 DIAGNOSIS — B440 Invasive pulmonary aspergillosis: Secondary | ICD-10-CM | POA: Diagnosis not present

## 2023-08-19 DIAGNOSIS — R4182 Altered mental status, unspecified: Secondary | ICD-10-CM

## 2023-08-19 DIAGNOSIS — I2510 Atherosclerotic heart disease of native coronary artery without angina pectoris: Secondary | ICD-10-CM | POA: Diagnosis not present

## 2023-08-19 DIAGNOSIS — Z86718 Personal history of other venous thrombosis and embolism: Secondary | ICD-10-CM | POA: Diagnosis not present

## 2023-08-19 DIAGNOSIS — Z1152 Encounter for screening for COVID-19: Secondary | ICD-10-CM | POA: Diagnosis not present

## 2023-08-19 DIAGNOSIS — Z96641 Presence of right artificial hip joint: Secondary | ICD-10-CM | POA: Diagnosis not present

## 2023-08-19 DIAGNOSIS — Z8672 Personal history of thrombophlebitis: Secondary | ICD-10-CM | POA: Diagnosis not present

## 2023-08-19 DIAGNOSIS — R0902 Hypoxemia: Secondary | ICD-10-CM | POA: Diagnosis not present

## 2023-08-19 DIAGNOSIS — R531 Weakness: Secondary | ICD-10-CM

## 2023-08-19 DIAGNOSIS — A419 Sepsis, unspecified organism: Secondary | ICD-10-CM | POA: Diagnosis not present

## 2023-08-19 DIAGNOSIS — F17210 Nicotine dependence, cigarettes, uncomplicated: Secondary | ICD-10-CM | POA: Diagnosis not present

## 2023-08-19 DIAGNOSIS — G9340 Encephalopathy, unspecified: Secondary | ICD-10-CM | POA: Diagnosis not present

## 2023-08-19 DIAGNOSIS — R636 Underweight: Secondary | ICD-10-CM | POA: Diagnosis not present

## 2023-08-19 DIAGNOSIS — E876 Hypokalemia: Principal | ICD-10-CM | POA: Diagnosis present

## 2023-08-19 DIAGNOSIS — R918 Other nonspecific abnormal finding of lung field: Secondary | ICD-10-CM | POA: Diagnosis not present

## 2023-08-19 LAB — CBC WITH DIFFERENTIAL/PLATELET
Abs Immature Granulocytes: 0.02 10*3/uL (ref 0.00–0.07)
Basophils Absolute: 0 10*3/uL (ref 0.0–0.1)
Basophils Relative: 1 %
Eosinophils Absolute: 0.3 10*3/uL (ref 0.0–0.5)
Eosinophils Relative: 4 %
HCT: 39.1 % (ref 36.0–46.0)
Hemoglobin: 11.9 g/dL — ABNORMAL LOW (ref 12.0–15.0)
Immature Granulocytes: 0 %
Lymphocytes Relative: 17 %
Lymphs Abs: 1.3 10*3/uL (ref 0.7–4.0)
MCH: 29.8 pg (ref 26.0–34.0)
MCHC: 30.4 g/dL (ref 30.0–36.0)
MCV: 97.8 fL (ref 80.0–100.0)
Monocytes Absolute: 0.6 10*3/uL (ref 0.1–1.0)
Monocytes Relative: 8 %
Neutro Abs: 5.5 10*3/uL (ref 1.7–7.7)
Neutrophils Relative %: 70 %
Platelets: 277 10*3/uL (ref 150–400)
RBC: 4 MIL/uL (ref 3.87–5.11)
RDW: 14.6 % (ref 11.5–15.5)
WBC: 7.8 10*3/uL (ref 4.0–10.5)
nRBC: 0 % (ref 0.0–0.2)

## 2023-08-19 LAB — URINALYSIS, W/ REFLEX TO CULTURE (INFECTION SUSPECTED)
Bacteria, UA: NONE SEEN
Bilirubin Urine: NEGATIVE
Glucose, UA: NEGATIVE mg/dL
Hgb urine dipstick: NEGATIVE
Ketones, ur: NEGATIVE mg/dL
Leukocytes,Ua: NEGATIVE
Nitrite: NEGATIVE
Protein, ur: 100 mg/dL — AB
Specific Gravity, Urine: 1.024 (ref 1.005–1.030)
pH: 5 (ref 5.0–8.0)

## 2023-08-19 LAB — COMPREHENSIVE METABOLIC PANEL
ALT: 9 U/L (ref 0–44)
AST: 18 U/L (ref 15–41)
Albumin: 3.1 g/dL — ABNORMAL LOW (ref 3.5–5.0)
Alkaline Phosphatase: 73 U/L (ref 38–126)
Anion gap: 13 (ref 5–15)
BUN: 19 mg/dL (ref 8–23)
CO2: 27 mmol/L (ref 22–32)
Calcium: 8.6 mg/dL — ABNORMAL LOW (ref 8.9–10.3)
Chloride: 102 mmol/L (ref 98–111)
Creatinine, Ser: 0.66 mg/dL (ref 0.44–1.00)
GFR, Estimated: >60 mL/min (ref >60)
Glucose, Bld: 114 mg/dL — ABNORMAL HIGH (ref 70–99)
Potassium: 2.6 mmol/L — CL (ref 3.5–5.1)
Sodium: 142 mmol/L (ref 135–145)
Total Bilirubin: 0.4 mg/dL (ref 0.0–1.2)
Total Protein: 6.6 g/dL (ref 6.5–8.1)

## 2023-08-19 LAB — RESP PANEL BY RT-PCR (RSV, FLU A&B, COVID)  RVPGX2
Influenza A by PCR: NEGATIVE
Influenza B by PCR: NEGATIVE
Resp Syncytial Virus by PCR: NEGATIVE
SARS Coronavirus 2 by RT PCR: NEGATIVE

## 2023-08-19 LAB — LACTIC ACID, PLASMA
Lactic Acid, Venous: 2.1 mmol/L (ref 0.5–1.9)
Lactic Acid, Venous: 1.1 mmol/L (ref 0.5–1.9)

## 2023-08-19 LAB — PROTIME-INR
INR: 1.1 (ref 0.8–1.2)
Prothrombin Time: 14.3 s (ref 11.4–15.2)

## 2023-08-19 LAB — TROPONIN I (HIGH SENSITIVITY): Troponin I (High Sensitivity): 10 ng/L (ref <18)

## 2023-08-19 LAB — MAGNESIUM: Magnesium: 2.2 mg/dL (ref 1.7–2.4)

## 2023-08-19 LAB — APTT: aPTT: 28 s (ref 24–36)

## 2023-08-19 MED ORDER — POTASSIUM CHLORIDE 10 MEQ/100ML IV SOLN
10.0000 meq | Freq: Once | INTRAVENOUS | Status: AC
  Administered 2023-08-19: 10 meq via INTRAVENOUS
  Filled 2023-08-19: qty 100

## 2023-08-19 MED ORDER — TRAZODONE HCL 50 MG PO TABS: 25.0000 mg | ORAL_TABLET | Freq: Every evening | ORAL | Status: DC | PRN

## 2023-08-19 MED ORDER — POTASSIUM CHLORIDE 20 MEQ PO PACK
40.0000 meq | PACK | Freq: Once | ORAL | Status: AC
  Administered 2023-08-20: 40 meq via ORAL
  Filled 2023-08-19: qty 2

## 2023-08-19 MED ORDER — MAGNESIUM HYDROXIDE 400 MG/5ML PO SUSP: 30.0000 mL | Freq: Every day | ORAL | Status: DC | PRN

## 2023-08-19 MED ORDER — OXYCODONE HCL 5 MG PO TABS
15.0000 mg | ORAL_TABLET | ORAL | Status: DC | PRN
  Administered 2023-08-19 – 2023-08-20 (×3): 15 mg via ORAL
  Filled 2023-08-19 (×3): qty 3

## 2023-08-19 MED ORDER — HYDROCOD POLI-CHLORPHE POLI ER 10-8 MG/5ML PO SUER: 5.0000 mL | Freq: Two times a day (BID) | ORAL | Status: DC | PRN

## 2023-08-19 MED ORDER — AZITHROMYCIN 250 MG PO TABS
500.0000 mg | ORAL_TABLET | Freq: Once | ORAL | Status: AC
  Administered 2023-08-19: 500 mg via ORAL
  Filled 2023-08-19: qty 2

## 2023-08-19 MED ORDER — GUAIFENESIN ER 600 MG PO TB12
600.0000 mg | ORAL_TABLET | Freq: Two times a day (BID) | ORAL | Status: DC
  Administered 2023-08-19 – 2023-08-21 (×4): 600 mg via ORAL
  Filled 2023-08-19 (×4): qty 1

## 2023-08-19 MED ORDER — ONDANSETRON HCL 4 MG PO TABS: 4.0000 mg | ORAL_TABLET | Freq: Four times a day (QID) | ORAL | Status: DC | PRN

## 2023-08-19 MED ORDER — SODIUM CHLORIDE 0.9 % IV SOLN
2.0000 g | INTRAVENOUS | Status: DC
  Administered 2023-08-20: 2 g via INTRAVENOUS
  Filled 2023-08-19: qty 20

## 2023-08-19 MED ORDER — MAGNESIUM SULFATE 2 GM/50ML IV SOLN
2.0000 g | INTRAVENOUS | Status: AC
  Administered 2023-08-19: 2 g via INTRAVENOUS
  Filled 2023-08-19: qty 50

## 2023-08-19 MED ORDER — POTASSIUM CHLORIDE 20 MEQ PO PACK
40.0000 meq | PACK | Freq: Once | ORAL | Status: AC
  Administered 2023-08-19: 40 meq via ORAL
  Filled 2023-08-19: qty 2

## 2023-08-19 MED ORDER — ITRACONAZOLE 100 MG PO CAPS
100.0000 mg | ORAL_CAPSULE | Freq: Two times a day (BID) | ORAL | Status: DC
  Filled 2023-08-19 (×5): qty 1

## 2023-08-19 MED ORDER — DILTIAZEM HCL ER COATED BEADS 180 MG PO CP24
180.0000 mg | ORAL_CAPSULE | Freq: Every day | ORAL | Status: DC
  Administered 2023-08-20 – 2023-08-21 (×2): 180 mg via ORAL
  Filled 2023-08-19 (×2): qty 1

## 2023-08-19 MED ORDER — SODIUM CHLORIDE 0.9 % IV SOLN
500.0000 mg | Freq: Every day | INTRAVENOUS | Status: DC
  Administered 2023-08-20: 500 mg via INTRAVENOUS
  Filled 2023-08-19: qty 5

## 2023-08-19 MED ORDER — POTASSIUM CHLORIDE CRYS ER 20 MEQ PO TBCR
40.0000 meq | EXTENDED_RELEASE_TABLET | Freq: Once | ORAL | Status: AC
  Administered 2023-08-19: 40 meq via ORAL
  Filled 2023-08-19: qty 2

## 2023-08-19 MED ORDER — IPRATROPIUM-ALBUTEROL 0.5-2.5 (3) MG/3ML IN SOLN
3.0000 mL | Freq: Once | RESPIRATORY_TRACT | Status: AC
  Administered 2023-08-19: 3 mL via RESPIRATORY_TRACT
  Filled 2023-08-19: qty 3

## 2023-08-19 MED ORDER — LOSARTAN POTASSIUM 50 MG PO TABS
25.0000 mg | ORAL_TABLET | Freq: Every day | ORAL | Status: DC
  Administered 2023-08-20 – 2023-08-21 (×2): 25 mg via ORAL
  Filled 2023-08-19 (×2): qty 1

## 2023-08-19 MED ORDER — POTASSIUM CHLORIDE IN NACL 20-0.9 MEQ/L-% IV SOLN: INTRAVENOUS | Status: DC

## 2023-08-19 MED ORDER — ALBUTEROL SULFATE (2.5 MG/3ML) 0.083% IN NEBU
INHALATION_SOLUTION | RESPIRATORY_TRACT | Status: AC
  Administered 2023-08-19: 2.5 mg
  Filled 2023-08-19: qty 3

## 2023-08-19 MED ORDER — ENOXAPARIN SODIUM 40 MG/0.4ML IJ SOSY
40.0000 mg | PREFILLED_SYRINGE | INTRAMUSCULAR | Status: DC
  Administered 2023-08-20: 40 mg via SUBCUTANEOUS
  Filled 2023-08-19: qty 0.4

## 2023-08-19 MED ORDER — IPRATROPIUM-ALBUTEROL 0.5-2.5 (3) MG/3ML IN SOLN
3.0000 mL | Freq: Four times a day (QID) | RESPIRATORY_TRACT | Status: DC
  Administered 2023-08-20 (×3): 3 mL via RESPIRATORY_TRACT
  Filled 2023-08-19 (×3): qty 3

## 2023-08-19 MED ORDER — ACETAMINOPHEN 325 MG PO TABS: 650.0000 mg | ORAL_TABLET | Freq: Four times a day (QID) | ORAL | Status: DC | PRN

## 2023-08-19 MED ORDER — ONDANSETRON HCL 4 MG/2ML IJ SOLN: 4.0000 mg | Freq: Four times a day (QID) | INTRAMUSCULAR | Status: DC | PRN

## 2023-08-19 MED ORDER — ACETAMINOPHEN 650 MG RE SUPP: 650.0000 mg | Freq: Four times a day (QID) | RECTAL | Status: DC | PRN

## 2023-08-19 MED ORDER — SODIUM CHLORIDE 0.9 % IV SOLN
1.0000 g | Freq: Once | INTRAVENOUS | Status: AC
  Administered 2023-08-19: 1 g via INTRAVENOUS
  Filled 2023-08-19: qty 10

## 2023-08-19 MED ORDER — LACTATED RINGERS IV SOLN: INTRAVENOUS | Status: AC

## 2023-08-19 NOTE — H&P (Incomplete)
[place]   PATIENT NAME: Teresa Edwards    MR#:  [id number]  DATE OF BIRTH:  03/24/1951  DATE OF ADMISSION:  08/19/2023  PRIMARY CARE PHYSICIAN: Donetta Potts, MD   Patient is coming from: Home  REQUESTING/REFERRING PHYSICIAN: Eber Hong, MD  CHIEF COMPLAINT:   Chief Complaint  Patient presents with   Emesis    HISTORY OF PRESENT ILLNESS:  Teresa Edwards is a 73 y.o. Caucasian female with medical history significant for COPD, coronary artery disease, DDD, DVT, essential hypertension and necrotizing aspergillosis, who presented to the emergency room with acute onset of altered mental status with confusion and hypoxia.  The patient admitted to dry cough and dyspnea lately without significant wheezing.  She uses oxygen only as needed at home.  Today her pulse oximetry dropped to 85% on room air according to her daughter.  She had nausea and vomiting on 8 Thursday and nausea on Thursday and Friday.  No chest pain or palpitations.  No dysuria, oliguria or hematuria or flank pain.  ED Course: When she came to the ER Pulsoxymeter was 88% on room air with otherwise unremarkable vital signs.  Labs revealed hypokalemia of 2.6 and albumin 3.1 with otherwise unremarkable CMP.  ABG showed pH 7.41 with a pCO2 50 pO2 of 77 HCO3 of 31.7 with O2 sat of 96%.  Lactic acid was 2.1 and later 1.1 with high sensitive troponin I of 10.  CBC showed hemoglobin 11.9 hematocrit 39.1.  Coag profile was normal.  Respiratory panel came back negative.  UA showed only 6-10 WBCs with 100 protein and no bacteria.  Blood sugars were drawn.  Culture EKG as reviewed by me : EKG showed normal sinus rhythm with a rate of 64 with Q waves anteroseptally. Imaging: Portable chest x-ray showed the following: 1. Progressive airspace infiltrate within the right apex which may relate to progressive chronic atypical or acute on chronic infection. 2. Rounded mass at the right lung base, better seen on prior  CT examination as a cavitary fluid-filled lesion.  The patient was given IV Rocephin and Zithromax, 2 g of IV magnesium sulfate, DuoNeb, 10 mill Cabbell IV potassium chloride.  She will be admitted to a medical telemetry bed for further evaluation and management.  PAST MEDICAL HISTORY:   Past Medical History:  Diagnosis Date   Aortic atherosclerosis (HCC)    Cavitary lung disease    Chronic pain    COPD (chronic obstructive pulmonary disease) (HCC)    Coronary artery calcification seen on CT scan    DDD (degenerative disc disease)    DVT femoral (deep venous thrombosis) with thrombophlebitis (HCC)    Dyspnea    Pt states she has had for months   Hypertension     PAST SURGICAL HISTORY:   Past Surgical History:  Procedure Laterality Date   BRONCHIAL BIOPSY  06/22/2022   Procedure: BRONCHIAL BIOPSIES;  Surgeon: Omar Person, MD;  Location: [facility] ENDOSCOPY;  Service: Pulmonary;;   BRONCHIAL NEEDLE ASPIRATION BIOPSY  06/22/2022   Procedure: BRONCHIAL NEEDLE ASPIRATION BIOPSIES;  Surgeon: Omar Person, MD;  Location: [facility] ENDOSCOPY;  Service: Pulmonary;;   BRONCHIAL WASHINGS  06/22/2022   Procedure: BRONCHIAL WASHINGS;  Surgeon: Omar Person, MD;  Location: [facility] ENDOSCOPY;  Service: Pulmonary;;   TOTAL HIP ARTHROPLASTY Right    TUBAL LIGATION  10/1971    SOCIAL HISTORY:   Social History   Tobacco Use   Smoking status: Every Day  Current packs/day: 3.00    Average packs/day: 3.0 packs/day for 40.0 years (120.0 ttl pk-yrs)    Types: Cigarettes   Smokeless tobacco: Never   Tobacco comments:    Smoking about 1 ppd.  She says she cuts back.  01/16/2023 hfb  Substance Use Topics   Alcohol use: No    Alcohol/week: 0.0 standard drinks of alcohol    FAMILY HISTORY:   Family History  Problem Relation Age of Onset   Deep vein thrombosis Mother    Other Mother        varicose veins   Hypertension Sister    Other Sister        varicose veins   Cancer Brother      DRUG ALLERGIES:  No Known Allergies  REVIEW OF SYSTEMS:   ROS As per history of present illness. All pertinent systems were reviewed above. Constitutional, HEENT, cardiovascular, respiratory, GI, GU, musculoskeletal, neuro, psychiatric, endocrine, integumentary and hematologic systems were reviewed and are otherwise negative/unremarkable except for positive findings mentioned above in the HPI.   MEDICATIONS AT HOME:   Prior to Admission medications   Medication Sig Start Date End Date Taking? Authorizing Provider  albuterol (PROVENTIL) (2.5 MG/3ML) 0.083% nebulizer solution Take 3 mLs (2.5 mg total) by nebulization every 6 (six) hours as needed for wheezing or shortness of breath. 01/16/23   Parrett, Virgel Bouquet, NP  albuterol (VENTOLIN HFA) 108 (90 Base) MCG/ACT inhaler Inhale 2 puffs into the lungs every 6 (six) hours as needed for wheezing or shortness of breath. 01/16/23   Parrett, Virgel Bouquet, NP  diltiazem (CARDIZEM CD) 180 MG 24 hr capsule Take 180 mg by mouth daily.    [provider]  Fluticasone-Umeclidin-Vilant (TRELEGY ELLIPTA) 200-62.5-25 MCG/ACT AEPB Inhale 1 puff into the lungs daily. 06/07/22   Omar Person, MD  Fluticasone-Umeclidin-Vilant (TRELEGY ELLIPTA) 200-62.5-25 MCG/ACT AEPB Inhale 1 puff into the lungs daily. 01/16/23   Parrett, Virgel Bouquet, NP  itraconazole (SPORANOX) 100 MG capsule Take 2 capsules (200 mg total) by mouth daily. 01/17/23   Parrett, Virgel Bouquet, NP  losartan (COZAAR) 25 MG tablet Take 25 mg by mouth daily. 02/24/22   [provider]  oxycodone (ROXICODONE) 30 MG immediate release tablet Take 15-30 mg by mouth every 4 (four) hours as needed for pain.     [provider]      VITAL SIGNS:  Blood pressure 115/66, pulse (!) 59, temperature 98 F (36.7 C), resp. rate 12, height 5\' 4"  (1.626 m), weight 48.5 kg, SpO2 95%.  PHYSICAL EXAMINATION:  Physical Exam  GENERAL:  73 y.o.-year-old patient lying in the bed with no acute distress.   EYES: Pupils equal, round, reactive to light and accommodation. No scleral icterus. Extraocular muscles intact.  HEENT: Head atraumatic, normocephalic. Oropharynx and nasopharynx clear.  NECK:  Supple, no jugular venous distention. No thyroid enlargement, no tenderness.  LUNGS: Diminished right basal and midlung zone as well as upper lung zone breath sounds with associated crackles in the left upper lung zone.  No use of accessory muscles of respiration.  CARDIOVASCULAR: Regular rate and rhythm, S1, S2 normal. No murmurs, rubs, or gallops.  ABDOMEN: Soft, nondistended, nontender. Bowel sounds present. No organomegaly or mass.  EXTREMITIES: No pedal edema, cyanosis, or clubbing.  NEUROLOGIC: Cranial nerves II through XII are intact. Muscle strength 5/5 in all extremities. Sensation intact. Gait not checked.  PSYCHIATRIC: The patient is alert and oriented x 3.  Normal affect and good eye contact. SKIN: No obvious  rash, lesion, or ulcer.   LABORATORY PANEL:   CBC Recent Labs  Lab 08/19/23 2008  WBC 7.8  HGB 11.9*  HCT 39.1  PLT 277   ------------------------------------------------------------------------------------------------------------------  Chemistries  Recent Labs  Lab 08/19/23 2008 08/19/23 2241  NA 142  --   K 2.6*  --   CL 102  --   CO2 27  --   GLUCOSE 114*  --   BUN 19  --   CREATININE 0.66  --   CALCIUM 8.6*  --   MG  --  2.2  AST 18  --   ALT 9  --   ALKPHOS 73  --   BILITOT 0.4  --    ------------------------------------------------------------------------------------------------------------------  Cardiac Enzymes No results for input(s): "TROPONINI" in the last 168 hours. ------------------------------------------------------------------------------------------------------------------  RADIOLOGY:  DG Chest Port 1 View Result Date: 08/19/2023 CLINICAL DATA:  Sepsis, vomiting, nausea, hypoxia EXAM: PORTABLE CHEST 1 VIEW COMPARISON:  04/26/2019,,  06/22/2022, CT 07/02/2023 FINDINGS: Lungs are symmetrically well expanded. Progressive airspace infiltrate has developed within the right apex which may relate to progressive chronic atypical or acute on chronic infection. Rounded mass at the right lung base is again seen, better seen on prior CT examination. No pneumothorax or pleural effusion. Cardiac size within limits. Pulmonary vascularity is. No acute bone abnormality. IMPRESSION: 1. Progressive airspace infiltrate within the right apex which may relate to progressive chronic atypical or acute on chronic infection. 2. Rounded mass at the right lung base, better seen on prior CT examination as a cavitary fluid-filled lesion. Electronically Signed   By: Helyn Numbers M.D.   On: 08/19/2023 19:28      IMPRESSION AND PLAN:  Assessment and Plan: * CAP (community acquired pneumonia) - This is right-sided upper lobe pneumonia.  The patient has subsequent acute respiratory failure with hypoxia and acute encephalopathy likely hypoxic - The patient will be admitted to a medical telemetry bed. - Will continue antibiotic therapy with IV Rocephin and Zithromax. - Mucolytic therapy be provided as well as duo nebs q.i.d. and q.4 hours p.r.n. - We will follow blood cultures.   COPD with acute exacerbation (HCC) - We will continue with scheduled and as needed DuoNebs for now as well as mucolytic's. - Antibiotics will be provided as mentioned above. - I will hold off on steroids for now. - I will hold off on long-acting beta agonist as well.  Chronic necrotizing pulmonary aspergillosis (HCC) - We will continue her itraconazole.  Essential hypertension - We will continue antihypertensive therapy.    DVT prophylaxis: Lovenox.  Advanced Care Planning:  Code Status: full code.  Family Communication:  The plan of care was discussed in details with the patient (and family). I answered all questions. The patient agreed to proceed with the above  mentioned plan. Further management will depend upon hospital course. Disposition Plan: Back to previous home environment Consults called: none.  All the records are reviewed and case discussed with ED provider.  Status is: Inpatient  At the time of the admission, it appears that the appropriate admission status for this patient is inpatient.  This is judged to be reasonable and necessary in order to provide the required intensity of service to ensure the patient's safety given the presenting symptoms, physical exam findings and initial radiographic and laboratory data in the context of comorbid conditions.  The patient requires inpatient status due to high intensity of service, high risk of further deterioration and high frequency of surveillance required.  I  certify that at the time of admission, it is my clinical judgment that the patient will require inpatient hospital care extending more than 2 midnights.                            Dispo: The patient is from: Home              Anticipated d/c is to: Home              Patient currently is not medically stable to d/c.              Difficult to place patient: No  Hannah Beat M.D on 08/20/2023 at 1:54 AM  [facility]   From 7 PM-7 AM, contact night-coverage [email or web address]  CC: Primary care physician; Donetta Potts, MD

## 2023-08-19 NOTE — Sepsis Progress Note (Signed)
 Elink following for sepsis protocol.

## 2023-08-19 NOTE — ED Triage Notes (Signed)
Pt with emesis on Thursday,  Friday with nausea.  Pt alert and oriented.  RA sats 89% at home, home O2 PRN.

## 2023-08-19 NOTE — ED Provider Notes (Signed)
[place] [facility]ERGENCY DEPARTMENT AT [facility] Provider Note   CSN: [id number] Arrival date & time: 08/19/23  1817     History  Chief Complaint  Patient presents with   Emesis    Teresa Edwards is a 73 y.o. female.   Emesis  73 year old female, history of COPD, continues to smoke about a pack of cigarettes per day who has been sick for several days with some generalized weakness, had 1 day of vomiting on Thursday and for the last couple of days has been short of breath.  The patient denies any increase in the amount of coughing but has had some cough which is chronic for her, she denies swelling of her legs, she has had no fevers, no chills, the last couple of days no nausea and vomiting, today the family noticed that she was a little bit confused when she would answer questions occasionally but not constantly.  They took her oxygen at home which was 88 to 89% but did not put her on her as needed oxygen which she does have at home.    Home Medications Prior to Admission medications   Medication Sig Start Date End Date Taking? Authorizing Provider  albuterol (PROVENTIL) (2.5 MG/3ML) 0.083% nebulizer solution Take 3 mLs (2.5 mg total) by nebulization every 6 (six) hours as needed for wheezing or shortness of breath. 01/16/23   Parrett, Virgel Bouquet, NP  albuterol (VENTOLIN HFA) 108 (90 Base) MCG/ACT inhaler Inhale 2 puffs into the lungs every 6 (six) hours as needed for wheezing or shortness of breath. 01/16/23   Parrett, Virgel Bouquet, NP  diltiazem (CARDIZEM CD) 180 MG 24 hr capsule Take 180 mg by mouth daily.    [provider]  Fluticasone-Umeclidin-Vilant (TRELEGY ELLIPTA) 200-62.5-25 MCG/ACT AEPB Inhale 1 puff into the lungs daily. 06/07/22   Omar Person, MD  Fluticasone-Umeclidin-Vilant (TRELEGY ELLIPTA) 200-62.5-25 MCG/ACT AEPB Inhale 1 puff into the lungs daily. 01/16/23   Parrett, Virgel Bouquet, NP  itraconazole (SPORANOX) 100 MG capsule Take 2 capsules (200 mg total) by  mouth daily. 01/17/23   Parrett, Virgel Bouquet, NP  losartan (COZAAR) 25 MG tablet Take 25 mg by mouth daily. 02/24/22   [provider]  oxycodone (ROXICODONE) 30 MG immediate release tablet Take 15-30 mg by mouth every 4 (four) hours as needed for pain.     [provider]      Allergies    Patient has no known allergies.    Review of Systems   Review of Systems  Gastrointestinal:  Positive for vomiting.  All other systems reviewed and are negative.   Physical Exam Updated Vital Signs BP (!) 126/53   Pulse 64   Temp 98.1 F (36.7 C) (Oral)   Resp 12   Ht 1.626 m (5\' 4" )   Wt 48.5 kg   SpO2 99%   BMI 18.37 kg/m  Physical Exam Vitals and nursing note reviewed.  Constitutional:      General: She is not in acute distress.    Appearance: She is well-developed.  HENT:     Head: Normocephalic and atraumatic.     Mouth/Throat:     Mouth: Mucous membranes are dry.     Pharynx: No oropharyngeal exudate.  Eyes:     General: No scleral icterus.       Right eye: No discharge.        Left eye: No discharge.     Conjunctiva/sclera: Conjunctivae normal.     Pupils: Pupils are  equal, round, and reactive to light.  Neck:     Thyroid: No thyromegaly.     Vascular: No JVD.  Cardiovascular:     Rate and Rhythm: Normal rate and regular rhythm.     Heart sounds: Normal heart sounds. No murmur heard.    No friction rub. No gallop.  Pulmonary:     Effort: Respiratory distress present.     Breath sounds: Wheezing and rhonchi present. No rales.     Comments: The patient is tachypneic, there is a prolonged expiratory phase, there is decreased lung sounds in all lung fields, there is expiratory wheezing and rhonchi.  She is able to speak in just shortened sentences Abdominal:     General: Bowel sounds are normal. There is no distension.     Palpations: Abdomen is soft. There is no mass.     Tenderness: There is no abdominal tenderness.  Musculoskeletal:        General: No  tenderness. Normal range of motion.     Cervical back: Normal range of motion and neck supple.     Right lower leg: Edema present.     Left lower leg: Edema present.     Comments: Bilateral ankle edema, 1+, symmetrical  Lymphadenopathy:     Cervical: No cervical adenopathy.  Skin:    General: Skin is warm and dry.     Findings: No erythema or rash.  Neurological:     Mental Status: She is alert.     Coordination: Coordination normal.  Psychiatric:        Behavior: Behavior normal.     ED Results / Procedures / Treatments   Labs (all labs ordered are listed, but only abnormal results are displayed) Labs Reviewed  LACTIC ACID, PLASMA - Abnormal; Notable for the following components:      Result Value   Lactic Acid, Venous 2.1 (*)    All other components within normal limits  COMPREHENSIVE METABOLIC PANEL - Abnormal; Notable for the following components:   Potassium 2.6 (*)    Glucose, Bld 114 (*)    Calcium 8.6 (*)    Albumin 3.1 (*)    All other components within normal limits  CBC WITH DIFFERENTIAL/PLATELET - Abnormal; Notable for the following components:   Hemoglobin 11.9 (*)    All other components within normal limits  URINALYSIS, W/ REFLEX TO CULTURE (INFECTION SUSPECTED) - Abnormal; Notable for the following components:   APPearance HAZY (*)    Protein, ur 100 (*)    All other components within normal limits  BLOOD GAS, ARTERIAL - Abnormal; Notable for the following components:   pCO2 arterial 50 (*)    pO2, Arterial 77 (*)    Bicarbonate 31.7 (*)    Acid-Base Excess 5.8 (*)    All other components within normal limits  RESP PANEL BY RT-PCR (RSV, FLU A&B, COVID)  RVPGX2  CULTURE, BLOOD (ROUTINE X 2)  CULTURE, BLOOD (ROUTINE X 2)  PROTIME-INR  APTT  LACTIC ACID, PLASMA  TROPONIN I (HIGH SENSITIVITY)    EKG EKG Interpretation Date/Time:  Sunday August 19 2023 19:01:58 EST Ventricular Rate:  64 PR Interval:  196 QRS Duration:  84 QT Interval:  377 QTC  Calculation: 389 R Axis:   69  Text Interpretation: Sinus rhythm Normal ECG Confirmed by Eber Hong ([id number]) on 08/19/2023 7:26:57 PM  Radiology DG Chest Port 1 View Result Date: 08/19/2023 CLINICAL DATA:  Sepsis, vomiting, nausea, hypoxia EXAM: PORTABLE CHEST 1 VIEW COMPARISON:  04/26/2019,, 06/22/2022,  CT 07/02/2023 FINDINGS: Lungs are symmetrically well expanded. Progressive airspace infiltrate has developed within the right apex which may relate to progressive chronic atypical or acute on chronic infection. Rounded mass at the right lung base is again seen, better seen on prior CT examination. No pneumothorax or pleural effusion. Cardiac size within limits. Pulmonary vascularity is. No acute bone abnormality. IMPRESSION: 1. Progressive airspace infiltrate within the right apex which may relate to progressive chronic atypical or acute on chronic infection. 2. Rounded mass at the right lung base, better seen on prior CT examination as a cavitary fluid-filled lesion. Electronically Signed   By: Helyn Numbers M.D.   On: 08/19/2023 19:28    Procedures Procedures    Medications Ordered in ED Medications  lactated ringers infusion ( Intravenous New Bag/Given 08/19/23 1938)  azithromycin (ZITHROMAX) tablet 500 mg (has no administration in time range)  ipratropium-albuterol (DUONEB) 0.5-2.5 (3) MG/3ML nebulizer solution 3 mL (has no administration in time range)  potassium chloride 10 mEq in 100 mL IVPB (10 mEq Intravenous New Bag/Given 08/19/23 2132)  magnesium sulfate IVPB 2 g 50 mL (has no administration in time range)  potassium chloride SA (KLOR-CON M) CR tablet 40 mEq (has no administration in time range)  cefTRIAXone (ROCEPHIN) 1 g in sodium chloride 0.9 % 100 mL IVPB (0 g Intravenous Stopped 08/19/23 1953)    ED Course/ Medical Decision Making/ A&P                                 Medical Decision Making Amount and/or Complexity of Data Reviewed Labs: ordered. Radiology:  ordered.  Risk Prescription drug management. Decision regarding hospitalization.    This patient presents to the ED for concern of some confusion, this involves an extensive number of treatment options, and is a complaint that carries with it a high risk of complications and morbidity.  The differential diagnosis includes hypercapnic or hypoxic respiratory failure, pneumonia, influenza, COVID-19, pneumothorax, could be related to gastrointestinal illness, will need evaluation with troponin, EKGs, labs and swabs as well as a chest x-ray   Co morbidities that complicate the patient evaluation  COPD with intermittent hypoxia   Additional history obtained:  Additional history obtained from medical record External records from outside source obtained and reviewed including office visits for chronic issues Known history of cavitary lung disease followed by pulmonology "chronic necrotizing pulmonary aspergillosis"   Lab Tests:  I Ordered, and personally interpreted labs.  The pertinent results include: Significant hypokalemia   Imaging Studies ordered:  I ordered imaging studies clued and chest x-ray I independently visualized and interpreted imaging which showed what appears to be a new onset pneumonia I agree with the radiologist interpretation   Cardiac Monitoring: / EKG:  The patient was maintained on a cardiac monitor.  I personally viewed and interpreted the cardiac monitored which showed an underlying rhythm of: Normal sinus rhythm   Consultations Obtained:  I requested consultation with the hospitalist,  and discussed lab and imaging findings as well as pertinent plan - they recommend: Admission to the hospital   Problem List / ED Course / Critical interventions / Medication management  The patient was given replacement potassium and magnesium, antibiotics for what appears to be pneumonia The patient has slight improvement I have reviewed the patients home medicines  and have made adjustments as needed   Social Determinants of Health:  Confused   Test / Admission - Considered:  Admitted to hospitalist         Final Clinical Impression(s) / ED Diagnoses Final diagnoses:  Hypokalemia  Weakness  Altered mental status, unspecified altered mental status type  Community acquired pneumonia of right upper lobe of lung    Rx / DC Orders ED Discharge Orders     None         Eber Hong, MD 08/19/23 2132

## 2023-08-19 NOTE — Progress Notes (Signed)
CODE SEPSIS - PHARMACY COMMUNICATION  **Broad Spectrum Antibiotics should be administered within 1 hour of Sepsis diagnosis**  Time Code Sepsis Called/Page Received: 6045  Antibiotics Ordered: ceftriaxone  Time of 1st antibiotic administration: 1920  Additional action taken by pharmacy: N/A  If necessary, Name of Provider/Nurse Contacted: N/A    Merryl Hacker ,PharmD Clinical Pharmacist  08/19/2023  6:49 PM

## 2023-08-20 DIAGNOSIS — I10 Essential (primary) hypertension: Secondary | ICD-10-CM | POA: Insufficient documentation

## 2023-08-20 DIAGNOSIS — G9340 Encephalopathy, unspecified: Secondary | ICD-10-CM

## 2023-08-20 DIAGNOSIS — J189 Pneumonia, unspecified organism: Secondary | ICD-10-CM | POA: Diagnosis not present

## 2023-08-20 DIAGNOSIS — J441 Chronic obstructive pulmonary disease with (acute) exacerbation: Secondary | ICD-10-CM

## 2023-08-20 LAB — BLOOD GAS, ARTERIAL
Acid-Base Excess: 5.8 mmol/L — ABNORMAL HIGH (ref 0.0–2.0)
Bicarbonate: 31.7 mmol/L — ABNORMAL HIGH (ref 20.0–28.0)
O2 Saturation: 96 %
Patient temperature: 37
pCO2 arterial: 50 mm[Hg] — ABNORMAL HIGH (ref 32–48)
pH, Arterial: 7.41 (ref 7.35–7.45)
pO2, Arterial: 77 mm[Hg] — ABNORMAL LOW (ref 83–108)

## 2023-08-20 LAB — CBC
HCT: 37.5 % (ref 36.0–46.0)
Hemoglobin: 11.6 g/dL — ABNORMAL LOW (ref 12.0–15.0)
MCH: 30.9 pg (ref 26.0–34.0)
MCHC: 30.9 g/dL (ref 30.0–36.0)
MCV: 100 fL (ref 80.0–100.0)
Platelets: 202 10*3/uL (ref 150–400)
RBC: 3.75 MIL/uL — ABNORMAL LOW (ref 3.87–5.11)
RDW: 14.9 % (ref 11.5–15.5)
WBC: 6.7 10*3/uL (ref 4.0–10.5)
nRBC: 0 % (ref 0.0–0.2)

## 2023-08-20 LAB — BASIC METABOLIC PANEL
Anion gap: 10 (ref 5–15)
BUN: 13 mg/dL (ref 8–23)
CO2: 24 mmol/L (ref 22–32)
Calcium: 8.4 mg/dL — ABNORMAL LOW (ref 8.9–10.3)
Chloride: 108 mmol/L (ref 98–111)
Creatinine, Ser: 0.44 mg/dL (ref 0.44–1.00)
GFR, Estimated: >60 mL/min (ref >60)
Glucose, Bld: 70 mg/dL (ref 70–99)
Potassium: 3.4 mmol/L — ABNORMAL LOW (ref 3.5–5.1)
Sodium: 142 mmol/L (ref 135–145)

## 2023-08-20 MED ORDER — ITRACONAZOLE 100 MG PO CAPS
100.0000 mg | ORAL_CAPSULE | Freq: Two times a day (BID) | ORAL | Status: DC
  Administered 2023-08-20: 100 mg via ORAL
  Filled 2023-08-20 (×4): qty 1

## 2023-08-20 MED ORDER — ITRACONAZOLE 100 MG PO CAPS
200.0000 mg | ORAL_CAPSULE | Freq: Two times a day (BID) | ORAL | Status: DC
Start: 2023-08-21 — End: 2023-08-21
  Administered 2023-08-21: 200 mg via ORAL
  Filled 2023-08-20 (×3): qty 2

## 2023-08-20 MED ORDER — POTASSIUM CHLORIDE 20 MEQ PO PACK
40.0000 meq | PACK | Freq: Once | ORAL | Status: AC
  Administered 2023-08-20: 40 meq via ORAL
  Filled 2023-08-20: qty 2

## 2023-08-20 MED ORDER — IPRATROPIUM-ALBUTEROL 0.5-2.5 (3) MG/3ML IN SOLN
3.0000 mL | Freq: Three times a day (TID) | RESPIRATORY_TRACT | Status: DC
  Administered 2023-08-20 – 2023-08-21 (×3): 3 mL via RESPIRATORY_TRACT
  Filled 2023-08-20 (×3): qty 3

## 2023-08-20 NOTE — Assessment & Plan Note (Addendum)
-   This is right-sided upper lobe pneumonia.  The patient has subsequent acute respiratory failure with hypoxia and acute encephalopathy likely hypoxic - The patient will be admitted to a medical telemetry bed. - Will continue antibiotic therapy with IV Rocephin and Zithromax. - Mucolytic therapy be provided as well as duo nebs q.i.d. and q.4 hours p.r.n. - We will follow blood cultures.

## 2023-08-20 NOTE — ED Notes (Signed)
ED TO INPATIENT HANDOFF REPORT  ED Nurse Name and Phone #: Karoline Caldwell [phone]  S Name/Age/Gender Teresa Edwards 73 y.o. female Room/Bed: APA07/APA07  Code Status   Code Status: Full Code  Home/SNF/Other Home Patient oriented to: self, place, time, and situation Is this baseline? Yes   Triage Complete: Triage complete  Chief Complaint CAP (community acquired pneumonia) [J18.9]  Triage Note Pt with emesis on Thursday,  Friday with nausea.  Pt alert and oriented.  RA sats 89% at home, home O2 PRN.    Allergies No Known Allergies  Level of Care/Admitting Diagnosis ED Disposition     ED Disposition  Admit   Condition  --   Comment  Hospital Area: [facility] [[id number]]  Level of Care: Telemetry [5]  Covid Evaluation: Asymptomatic - no recent exposure (last 10 days) testing not required  Diagnosis: CAP (community acquired pneumonia) [147829]  Admitting Physician: Hannah Beat [[id number]]  Attending Physician: Hannah Beat [[id number]]  Certification:: I certify this patient will need inpatient services for at least 2 midnights  Expected Medical Readiness: 08/21/2023          B Medical/Surgery History Past Medical History:  Diagnosis Date   Aortic atherosclerosis (HCC)    Cavitary lung disease    Chronic pain    COPD (chronic obstructive pulmonary disease) (HCC)    Coronary artery calcification seen on CT scan    DDD (degenerative disc disease)    DVT femoral (deep venous thrombosis) with thrombophlebitis (HCC)    Dyspnea    Pt states she has had for months   Hypertension    Past Surgical History:  Procedure Laterality Date   BRONCHIAL BIOPSY  06/22/2022   Procedure: BRONCHIAL BIOPSIES;  Surgeon: Omar Person, MD;  Location: [facility] ENDOSCOPY;  Service: Pulmonary;;   BRONCHIAL NEEDLE ASPIRATION BIOPSY  06/22/2022   Procedure: BRONCHIAL NEEDLE ASPIRATION BIOPSIES;  Surgeon: Omar Person, MD;  Location: [facility] ENDOSCOPY;  Service: Pulmonary;;   BRONCHIAL  WASHINGS  06/22/2022   Procedure: BRONCHIAL WASHINGS;  Surgeon: Omar Person, MD;  Location: [facility] ENDOSCOPY;  Service: Pulmonary;;   TOTAL HIP ARTHROPLASTY Right    TUBAL LIGATION  10/1971     A IV Location/Drains/Wounds Patient Lines/Drains/Airways Status     Active Line/Drains/Airways     Name Placement date Placement time Site Days   Peripheral IV 08/19/23 20 G 1" Anterior;Right Forearm 08/19/23  1919  Forearm  1            Intake/Output Last 24 hours  Intake/Output Summary (Last 24 hours) at 08/20/2023 0327 Last data filed at 08/20/2023 0236 Gross per 24 hour  Intake 1015.77 ml  Output --  Net 1015.77 ml    Labs/Imaging Results for orders placed or performed during the hospital encounter of 08/19/23 (from the past 48 hours)  Resp panel by RT-PCR (RSV, Flu A&B, Covid) Anterior Nasal Swab     Status: None   Collection Time: 08/19/23  6:48 PM   Specimen: Anterior Nasal Swab  Result Value Ref Range   SARS Coronavirus 2 by RT PCR NEGATIVE NEGATIVE    Comment: (NOTE) SARS-CoV-2 target nucleic acids are NOT DETECTED.  The SARS-CoV-2 RNA is generally detectable in upper respiratory specimens during the acute phase of infection. The lowest concentration of SARS-CoV-2 viral copies this assay can detect is 138 copies/mL. A negative result does not preclude SARS-Cov-2 infection and should not be used as the sole basis for treatment or other patient management decisions. A  negative result may occur with  improper specimen collection/handling, submission of specimen other than nasopharyngeal swab, presence of viral mutation(s) within the areas targeted by this assay, and inadequate number of viral copies(<138 copies/mL). A negative result must be combined with clinical observations, patient history, and epidemiological information. The expected result is Negative.  Fact Sheet for Patients:  BloggerCourse.com  Fact Sheet for Healthcare Providers:   SeriousBroker.it  This test is no t yet approved or cleared by the [place] FDA and  has been authorized for detection and/or diagnosis of SARS-CoV-2 by FDA under an Emergency Use Authorization (EUA). This EUA will remain  in effect (meaning this test can be used) for the duration of the COVID-19 declaration under Section 564(b)(1) of the Act, 21 U.S.C.section 360bbb-3(b)(1), unless the authorization is terminated  or revoked sooner.       Influenza A by PCR NEGATIVE NEGATIVE   Influenza B by PCR NEGATIVE NEGATIVE    Comment: (NOTE) The Xpert Xpress SARS-CoV-2/FLU/RSV plus assay is intended as an aid in the diagnosis of influenza from Nasopharyngeal swab specimens and should not be used as a sole basis for treatment. Nasal washings and aspirates are unacceptable for Xpert Xpress SARS-CoV-2/FLU/RSV testing.  Fact Sheet for Patients: BloggerCourse.com  Fact Sheet for Healthcare Providers: SeriousBroker.it  This test is not yet approved or cleared by the [place] FDA and has been authorized for detection and/or diagnosis of SARS-CoV-2 by FDA under an Emergency Use Authorization (EUA). This EUA will remain in effect (meaning this test can be used) for the duration of the COVID-19 declaration under Section 564(b)(1) of the Act, 21 U.S.C. section 360bbb-3(b)(1), unless the authorization is terminated or revoked.     Resp Syncytial Virus by PCR NEGATIVE NEGATIVE    Comment: (NOTE) Fact Sheet for Patients: BloggerCourse.com  Fact Sheet for Healthcare Providers: SeriousBroker.it  This test is not yet approved or cleared by the [place] FDA and has been authorized for detection and/or diagnosis of SARS-CoV-2 by FDA under an Emergency Use Authorization (EUA). This EUA will remain in effect (meaning this test can be used) for the duration of  the COVID-19 declaration under Section 564(b)(1) of the Act, 21 U.S.C. section 360bbb-3(b)(1), unless the authorization is terminated or revoked.  Performed at [facility], [address] [place], [place] [address]   Urinalysis, w/ Reflex to Culture (Infection Suspected) -Urine, Clean Catch     Status: Abnormal   Collection Time: 08/19/23  6:49 PM  Result Value Ref Range   Specimen Source URINE, CLEAN CATCH    Color, Urine YELLOW YELLOW   APPearance HAZY (A) CLEAR   Specific Gravity, Urine 1.024 1.005 - 1.030   pH 5.0 5.0 - 8.0   Glucose, UA NEGATIVE NEGATIVE mg/dL   Hgb urine dipstick NEGATIVE NEGATIVE   Bilirubin Urine NEGATIVE NEGATIVE   Ketones, ur NEGATIVE NEGATIVE mg/dL   Protein, ur 578 (A) NEGATIVE mg/dL   Nitrite NEGATIVE NEGATIVE   Leukocytes,Ua NEGATIVE NEGATIVE   RBC / HPF 0-5 0 - 5 RBC/hpf   WBC, UA 6-10 0 - 5 WBC/hpf    Comment:        Reflex urine culture not performed if WBC <=10, OR if Squamous epithelial cells >5. If Squamous epithelial cells >5 suggest recollection.    Bacteria, UA NONE SEEN NONE SEEN   Squamous Epithelial / HPF 0-5 0 - 5 /HPF   Mucus PRESENT    Hyaline Casts, UA PRESENT     Comment: Performed at [facility], [address] [place], [place] [address]  Blood Culture (routine x 2)     Status: None (Preliminary result)   Collection Time: 08/19/23  7:00 PM   Specimen: BLOOD RIGHT FOREARM  Result Value Ref Range   Specimen Description BLOOD RIGHT FOREARM BLOOD    Special Requests      BOTTLES DRAWN AEROBIC AND ANAEROBIC Blood Culture adequate volume Performed at [facility], [address], [place], [place] [address]    Culture PENDING    Report Status PENDING   Blood gas, arterial (at [facility])     Status: Abnormal   Collection Time: 08/19/23  7:33 PM  Result Value Ref Range   pH, Arterial 7.41 7.35 - 7.45   pCO2 arterial 50 (H) 32 - 48 mmHg   pO2, Arterial 77 (L) 83 - 108 mmHg   Bicarbonate 31.7 (H) 20.0 - 28.0 mmol/L   Acid-Base  Excess 5.8 (H) 0.0 - 2.0 mmol/L   O2 Saturation 96 %   Patient temperature 37.0    Drawn by RFL    Allens test (pass/fail) PASS PASS    Comment: Performed at [facility], [address] [place], [place] [address]  Lactic acid, plasma     Status: Abnormal   Collection Time: 08/19/23  8:08 PM  Result Value Ref Range   Lactic Acid, Venous 2.1 (HH) 0.5 - 1.9 mmol/L    Comment: CRITICAL RESULT CALLED TO, READ BACK BY AND VERIFIED WITH WILSON,R ON 08/19/23 AT 2042 BY PURDIE,J Performed at [facility], [address] [place], [place] [address]   Comprehensive metabolic panel     Status: Abnormal   Collection Time: 08/19/23  8:08 PM  Result Value Ref Range   Sodium 142 135 - 145 mmol/L   Potassium 2.6 (LL) 3.5 - 5.1 mmol/L    Comment: CRITICAL RESULT CALLED TO, READ BACK BY AND VERIFIED WITH DUSTIN,C ON 08/19/23 AT 2050 BY PURDIE,J   Chloride 102 98 - 111 mmol/L   CO2 27 22 - 32 mmol/L   Glucose, Bld 114 (H) 70 - 99 mg/dL    Comment: Glucose reference range applies only to samples taken after fasting for at least 8 hours.   BUN 19 8 - 23 mg/dL   Creatinine, Ser 3.66 0.44 - 1.00 mg/dL   Calcium 8.6 (L) 8.9 - 10.3 mg/dL   Total Protein 6.6 6.5 - 8.1 g/dL   Albumin 3.1 (L) 3.5 - 5.0 g/dL   AST 18 15 - 41 U/L   ALT 9 0 - 44 U/L   Alkaline Phosphatase 73 38 - 126 U/L   Total Bilirubin 0.4 0.0 - 1.2 mg/dL   GFR, Estimated >44 >03 mL/min    Comment: (NOTE) Calculated using the CKD-EPI Creatinine Equation (2021)    Anion gap 13 5 - 15    Comment: Performed at [facility], [address] [place], [place] [address]  CBC with Differential     Status: Abnormal   Collection Time: 08/19/23  8:08 PM  Result Value Ref Range   WBC 7.8 4.0 - 10.5 K/uL   RBC 4.00 3.87 - 5.11 MIL/uL   Hemoglobin 11.9 (L) 12.0 - 15.0 g/dL   HCT 95.6 38.7 - 56.4 %   MCV 97.8 80.0 - 100.0 fL   MCH 29.8 26.0 - 34.0 pg   MCHC 30.4 30.0 - 36.0 g/dL   RDW 33.2 95.1 - 88.4 %   Platelets 277 150 - 400 K/uL   nRBC 0.0  0.0  - 0.2 %   Neutrophils Relative % 70 %   Neutro Abs 5.5 1.7 - 7.7 K/uL   Lymphocytes Relative 17 %   Lymphs Abs 1.3 0.7 - 4.0 K/uL   Monocytes Relative 8 %   Monocytes Absolute 0.6 0.1 - 1.0 K/uL   Eosinophils Relative 4 %   Eosinophils Absolute 0.3 0.0 - 0.5 K/uL   Basophils Relative 1 %   Basophils Absolute 0.0 0.0 - 0.1 K/uL   Immature Granulocytes 0 %   Abs Immature Granulocytes 0.02 0.00 - 0.07 K/uL    Comment: Performed at [facility], [address] [place], [place] [address]  Protime-INR     Status: None   Collection Time: 08/19/23  8:08 PM  Result Value Ref Range   Prothrombin Time 14.3 11.4 - 15.2 seconds   INR 1.1 0.8 - 1.2    Comment: (NOTE) INR goal varies based on device and disease states. Performed at [facility], [address], [place], [place] [address]   APTT     Status: None   Collection Time: 08/19/23  8:08 PM  Result Value Ref Range   aPTT 28 24 - 36 seconds    Comment: Performed at [facility], [address], [place], [place] [address]  Blood Culture (routine x 2)     Status: None (Preliminary result)   Collection Time: 08/19/23  8:08 PM   Specimen: BLOOD LEFT FOREARM  Result Value Ref Range   Specimen Description BLOOD LEFT FOREARM BLOOD    Special Requests      BOTTLES DRAWN AEROBIC ONLY Blood Culture results may not be optimal due to an inadequate volume of blood received in culture bottles Performed at [facility], [address] [place], [place] [address]    Culture PENDING    Report Status PENDING   Troponin I (High Sensitivity)     Status: None   Collection Time: 08/19/23  8:08 PM  Result Value Ref Range   Troponin I (High Sensitivity) 10 <18 ng/L    Comment: (NOTE) Elevated high sensitivity troponin I (hsTnI) values and significant  changes across serial measurements may suggest ACS but many other  chronic and acute conditions are known to elevate hsTnI results.  Refer to the "Links" section for chest pain algorithms and additional   guidance. Performed at [facility], [address], [place], [place] [address]   Lactic acid, plasma     Status: None   Collection Time: 08/19/23 10:41 PM  Result Value Ref Range   Lactic Acid, Venous 1.1 0.5 - 1.9 mmol/L    Comment: Performed at [facility], [address], [place], [place] [address]  Magnesium     Status: None   Collection Time: 08/19/23 10:41 PM  Result Value Ref Range   Magnesium 2.2 1.7 - 2.4 mg/dL    Comment: Performed at [facility], [address], [place], [place] [address]   DG Chest Port 1 View Result Date: 08/19/2023 CLINICAL DATA:  Sepsis, vomiting, nausea, hypoxia EXAM: PORTABLE CHEST 1 VIEW COMPARISON:  04/26/2019,, 06/22/2022, CT 07/02/2023 FINDINGS: Lungs are symmetrically well expanded. Progressive airspace infiltrate has developed within the right apex which may relate to progressive chronic atypical or acute on chronic infection. Rounded mass at the right lung base is again seen, better seen on prior CT examination. No pneumothorax or pleural effusion. Cardiac size within limits. Pulmonary vascularity is. No acute bone abnormality. IMPRESSION: 1. Progressive airspace infiltrate within the right apex which may relate  to progressive chronic atypical or acute on chronic infection. 2. Rounded mass at the right lung base, better seen on prior CT examination as a cavitary fluid-filled lesion. Electronically Signed   By: Helyn Numbers M.D.   On: 08/19/2023 19:28    Pending Labs Unresulted Labs (From admission, onward)     Start     Ordered   08/20/23 0500  Basic metabolic panel  Tomorrow morning,   R        08/19/23 2308   08/20/23 0500  CBC  Tomorrow morning,   R        08/19/23 2308            Vitals/Pain Today's Vitals   08/19/23 2343 08/20/23 0129 08/20/23 0300 08/20/23 0305  BP:    113/60  Pulse:   (!) 53 (!) 53  Resp:   10 13  Temp:      TempSrc:      SpO2:   97% 99%  Weight:      Height:      PainSc: 8  Asleep      Isolation  Precautions No active isolations  Medications Medications  lactated ringers infusion (0 mLs Intravenous Stopped 08/20/23 0236)  itraconazole (SPORANOX) capsule 100 mg (has no administration in time range)  oxyCODONE (Oxy IR/ROXICODONE) immediate release tablet 15-30 mg (15 mg Oral Given 08/19/23 2343)  losartan (COZAAR) tablet 25 mg (has no administration in time range)  diltiazem (CARDIZEM CD) 24 hr capsule 180 mg (has no administration in time range)  ipratropium-albuterol (DUONEB) 0.5-2.5 (3) MG/3ML nebulizer solution 3 mL (has no administration in time range)  guaiFENesin (MUCINEX) 12 hr tablet 600 mg (600 mg Oral Given 08/19/23 2343)  chlorpheniramine-HYDROcodone (TUSSIONEX) 10-8 MG/5ML suspension 5 mL (has no administration in time range)  enoxaparin (LOVENOX) injection 40 mg (has no administration in time range)  cefTRIAXone (ROCEPHIN) 2 g in sodium chloride 0.9 % 100 mL IVPB (has no administration in time range)  azithromycin (ZITHROMAX) 500 mg in sodium chloride 0.9 % 250 mL IVPB (has no administration in time range)  0.9 % NaCl with KCl 20 mEq/ L  infusion ( Intravenous New Bag/Given 08/20/23 0236)  acetaminophen (TYLENOL) tablet 650 mg (has no administration in time range)    Or  acetaminophen (TYLENOL) suppository 650 mg (has no administration in time range)  traZODone (DESYREL) tablet 25 mg (has no administration in time range)  magnesium hydroxide (MILK OF MAGNESIA) suspension 30 mL (has no administration in time range)  ondansetron (ZOFRAN) tablet 4 mg (has no administration in time range)    Or  ondansetron (ZOFRAN) injection 4 mg (has no administration in time range)  cefTRIAXone (ROCEPHIN) 1 g in sodium chloride 0.9 % 100 mL IVPB (0 g Intravenous Stopped 08/19/23 1953)  azithromycin (ZITHROMAX) tablet 500 mg (500 mg Oral Given 08/19/23 2133)  ipratropium-albuterol (DUONEB) 0.5-2.5 (3) MG/3ML nebulizer solution 3 mL (3 mLs Nebulization Given 08/19/23 2155)  potassium chloride 10 mEq in  100 mL IVPB (0 mEq Intravenous Stopped 08/19/23 2304)  magnesium sulfate IVPB 2 g 50 mL (0 g Intravenous Stopped 08/19/23 2304)  potassium chloride SA (KLOR-CON M) CR tablet 40 mEq (40 mEq Oral Given 08/19/23 2133)  albuterol (PROVENTIL) (2.5 MG/3ML) 0.083% nebulizer solution (2.5 mg  Given 08/19/23 2156)  potassium chloride (KLOR-CON) packet 40 mEq (40 mEq Oral Given 08/19/23 2343)  potassium chloride (KLOR-CON) packet 40 mEq (40 mEq Oral Given 08/20/23 0236)    Mobility walks with device  Focused Assessments    R Recommendations: See Admitting Provider Note  Report given to:   Additional Notes:

## 2023-08-20 NOTE — Progress Notes (Signed)
        Date: 08/20/2023  Patient name: Teresa Edwards  Medical record number: [id number]  Date of birth: 03-Jun-1951   Tomorrow provided I can get the [employer] website and the portable camera into patients room I will introduce myself and do a virtual consult  Patient has had hx of chronic pulmonary aspergillosis and is followed by [name] and was referred to our clinic but not seen by [place].  She is on lower dose of itraconazole that we would prefer to use and will for now go to more standard 200mg  po BID dose.  I DO think she should be connected to our clinic for her care.    Acey Lav 08/20/2023, 5:04 PM

## 2023-08-20 NOTE — Progress Notes (Signed)
PROGRESS NOTE    Teresa Edwards  IHK:[id number] DOB: September 21, 1950 DOA: 08/19/2023 PCP: Donetta Potts, MD   Brief Narrative:    Teresa Edwards is a 73 y.o. Caucasian female with medical history significant for COPD, coronary artery disease, DDD, DVT, essential hypertension and necrotizing aspergillosis, who presented to the emergency room with acute onset of altered mental status with confusion and hypoxia.  Patient was admitted with acute metabolic encephalopathy in the setting of community-acquired pneumonia and also has COPD with acute exacerbation.  Assessment & Plan:   Principal Problem:   CAP (community acquired pneumonia) Active Problems:   COPD with acute exacerbation (HCC)   Chronic necrotizing pulmonary aspergillosis (HCC)   Essential hypertension   Acute encephalopathy  Assessment and Plan:   Acute metabolic encephalopathy secondary to CAP (community acquired pneumonia) - Encephalopathy has improved, and patient appears to be back to baseline level of mentation -This is right-sided upper lobe pneumonia.  The patient has subsequent acute respiratory failure with hypoxia and acute encephalopathy likely hypoxic - The patient will be admitted to a medical telemetry bed. - Will continue antibiotic therapy with IV Rocephin and Zithromax. - Mucolytic therapy be provided as well as duo nebs q.i.d. and q.4 hours p.r.n. - We will follow blood cultures which are currently pending     COPD with acute exacerbation (HCC) - We will continue with scheduled and as needed DuoNebs for now as well as mucolytic's. -Baseline patient wears oxygen as needed 2-3 L. - Antibiotics will be provided as mentioned above. - I will hold off on steroids for now. - I will hold off on long-acting beta agonist as well.  Hypokalemia -Improving, continue supplementation -Follow a.m. labs   Chronic necrotizing pulmonary aspergillosis (HCC) - We will continue her itraconazole.   Essential  hypertension - We will continue antihypertensive therapy.   DVT prophylaxis:Lovenox Code Status: Full Family Communication: None at bedside Disposition Plan:  Status is: Inpatient Remains inpatient appropriate because: Need for IV antibiotics.   Consultants:  None  Procedures:  None  Antimicrobials:  Anti-infectives (From admission, onward)    Start     Dose/Rate Route Frequency Ordered Stop   08/20/23 2200  azithromycin (ZITHROMAX) 500 mg in sodium chloride 0.9 % 250 mL IVPB        500 mg 250 mL/hr over 60 Minutes Intravenous Daily at bedtime 08/19/23 2307 08/24/23 2159   08/20/23 1800  cefTRIAXone (ROCEPHIN) 2 g in sodium chloride 0.9 % 100 mL IVPB        2 g 200 mL/hr over 30 Minutes Intravenous Every 24 hours 08/19/23 2307 08/24/23 1759   08/20/23 0800  itraconazole (SPORANOX) capsule 100 mg        100 mg Oral 2 times daily with meals 08/19/23 2307     08/19/23 2100  azithromycin (ZITHROMAX) tablet 500 mg        500 mg Oral  Once 08/19/23 2058 08/19/23 2133   08/19/23 1900  cefTRIAXone (ROCEPHIN) 1 g in sodium chloride 0.9 % 100 mL IVPB        1 g 200 mL/hr over 30 Minutes Intravenous  Once 08/19/23 1848 08/19/23 1953      Subjective: Patient seen and evaluated today with no new acute complaints or concerns. No acute concerns or events noted overnight.  She does not appear confused any longer, continues to have shortness of breath and remains on nasal cannula oxygen.  Objective: Vitals:   08/20/23 0300 08/20/23 0305 08/20/23 0328 08/20/23  0403  BP:  113/60  130/66  Pulse: (!) 53 (!) 53  (!) 55  Resp: 10 13  15   Temp:   98.5 F (36.9 C) 98.3 F (36.8 C)  TempSrc:    Oral  SpO2: 97% 99%  96%  Weight:      Height:        Intake/Output Summary (Last 24 hours) at 08/20/2023 0715 Last data filed at 08/20/2023 0236 Gross per 24 hour  Intake 1015.77 ml  Output --  Net 1015.77 ml   Filed Weights   08/19/23 1832  Weight: 48.5 kg    Examination:  General  exam: Appears calm and comfortable  Respiratory system: Clear to auscultation. Respiratory effort normal.  2 L nasal cannula oxygen. Cardiovascular system: S1 & S2 heard, RRR.  Gastrointestinal system: Abdomen is soft Central nervous system: Alert and awake Extremities: No edema Skin: No significant lesions noted Psychiatry: Flat affect.    Data Reviewed: I have personally reviewed following labs and imaging studies  CBC: Recent Labs  Lab 08/19/23 2008 08/20/23 0427  WBC 7.8 6.7  NEUTROABS 5.5  --   HGB 11.9* 11.6*  HCT 39.1 37.5  MCV 97.8 100.0  PLT 277 202   Basic Metabolic Panel: Recent Labs  Lab 08/19/23 2008 08/19/23 2241 08/20/23 0427  NA 142  --  142  K 2.6*  --  3.4*  CL 102  --  108  CO2 27  --  24  GLUCOSE 114*  --  70  BUN 19  --  13  CREATININE 0.66  --  0.44  CALCIUM 8.6*  --  8.4*  MG  --  2.2  --    GFR: Estimated Creatinine Clearance: 48.7 mL/min (by C-G formula based on SCr of 0.44 mg/dL). Liver Function Tests: Recent Labs  Lab 08/19/23 2008  AST 18  ALT 9  ALKPHOS 73  BILITOT 0.4  PROT 6.6  ALBUMIN 3.1*   No results for input(s): "LIPASE", "AMYLASE" in the last 168 hours. No results for input(s): "AMMONIA" in the last 168 hours. Coagulation Profile: Recent Labs  Lab 08/19/23 2008  INR 1.1   Cardiac Enzymes: No results for input(s): "CKTOTAL", "CKMB", "CKMBINDEX", "TROPONINI" in the last 168 hours. BNP (last 3 results) No results for input(s): "PROBNP" in the last 8760 hours. HbA1C: No results for input(s): "HGBA1C" in the last 72 hours. CBG: No results for input(s): "GLUCAP" in the last 168 hours. Lipid Profile: No results for input(s): "CHOL", "HDL", "LDLCALC", "TRIG", "CHOLHDL", "LDLDIRECT" in the last 72 hours. Thyroid Function Tests: No results for input(s): "TSH", "T4TOTAL", "FREET4", "T3FREE", "THYROIDAB" in the last 72 hours. Anemia Panel: No results for input(s): "VITAMINB12", "FOLATE", "FERRITIN", "TIBC", "IRON",  "RETICCTPCT" in the last 72 hours. Sepsis Labs: Recent Labs  Lab 08/19/23 2008 08/19/23 2241  LATICACIDVEN 2.1* 1.1    Recent Results (from the past 240 hours)  Resp panel by RT-PCR (RSV, Flu A&B, Covid) Anterior Nasal Swab     Status: None   Collection Time: 08/19/23  6:48 PM   Specimen: Anterior Nasal Swab  Result Value Ref Range Status   SARS Coronavirus 2 by RT PCR NEGATIVE NEGATIVE Final    Comment: (NOTE) SARS-CoV-2 target nucleic acids are NOT DETECTED.  The SARS-CoV-2 RNA is generally detectable in upper respiratory specimens during the acute phase of infection. The lowest concentration of SARS-CoV-2 viral copies this assay can detect is 138 copies/mL. A negative result does not preclude SARS-Cov-2 infection and should not be  used as the sole basis for treatment or other patient management decisions. A negative result may occur with  improper specimen collection/handling, submission of specimen other than nasopharyngeal swab, presence of viral mutation(s) within the areas targeted by this assay, and inadequate number of viral copies(<138 copies/mL). A negative result must be combined with clinical observations, patient history, and epidemiological information. The expected result is Negative.  Fact Sheet for Patients:  BloggerCourse.com  Fact Sheet for Healthcare Providers:  SeriousBroker.it  This test is no t yet approved or cleared by the [place] FDA and  has been authorized for detection and/or diagnosis of SARS-CoV-2 by FDA under an Emergency Use Authorization (EUA). This EUA will remain  in effect (meaning this test can be used) for the duration of the COVID-19 declaration under Section 564(b)(1) of the Act, 21 U.S.C.section 360bbb-3(b)(1), unless the authorization is terminated  or revoked sooner.       Influenza A by PCR NEGATIVE NEGATIVE Final   Influenza B by PCR NEGATIVE NEGATIVE Final     Comment: (NOTE) The Xpert Xpress SARS-CoV-2/FLU/RSV plus assay is intended as an aid in the diagnosis of influenza from Nasopharyngeal swab specimens and should not be used as a sole basis for treatment. Nasal washings and aspirates are unacceptable for Xpert Xpress SARS-CoV-2/FLU/RSV testing.  Fact Sheet for Patients: BloggerCourse.com  Fact Sheet for Healthcare Providers: SeriousBroker.it  This test is not yet approved or cleared by the [place] FDA and has been authorized for detection and/or diagnosis of SARS-CoV-2 by FDA under an Emergency Use Authorization (EUA). This EUA will remain in effect (meaning this test can be used) for the duration of the COVID-19 declaration under Section 564(b)(1) of the Act, 21 U.S.C. section 360bbb-3(b)(1), unless the authorization is terminated or revoked.     Resp Syncytial Virus by PCR NEGATIVE NEGATIVE Final    Comment: (NOTE) Fact Sheet for Patients: BloggerCourse.com  Fact Sheet for Healthcare Providers: SeriousBroker.it  This test is not yet approved or cleared by the [place] FDA and has been authorized for detection and/or diagnosis of SARS-CoV-2 by FDA under an Emergency Use Authorization (EUA). This EUA will remain in effect (meaning this test can be used) for the duration of the COVID-19 declaration under Section 564(b)(1) of the Act, 21 U.S.C. section 360bbb-3(b)(1), unless the authorization is terminated or revoked.  Performed at [facility], [address] [place], [place] [address]   Blood Culture (routine x 2)     Status: None (Preliminary result)   Collection Time: 08/19/23  7:00 PM   Specimen: BLOOD RIGHT FOREARM  Result Value Ref Range Status   Specimen Description BLOOD RIGHT FOREARM BLOOD  Final   Special Requests   Final    BOTTLES DRAWN AEROBIC AND ANAEROBIC Blood Culture adequate volume Performed at  [facility], [address] [place], [place] [address]    Culture PENDING  Incomplete   Report Status PENDING  Incomplete  Blood Culture (routine x 2)     Status: None (Preliminary result)   Collection Time: 08/19/23  8:08 PM   Specimen: BLOOD LEFT FOREARM  Result Value Ref Range Status   Specimen Description BLOOD LEFT FOREARM BLOOD  Final   Special Requests   Final    BOTTLES DRAWN AEROBIC ONLY Blood Culture results may not be optimal due to an inadequate volume of blood received in culture bottles Performed at [facility], [address], [place], [place] [address]    Culture PENDING  Incomplete   Report  Status PENDING  Incomplete         Radiology Studies: DG Chest Port 1 View Result Date: 08/19/2023 CLINICAL DATA:  Sepsis, vomiting, nausea, hypoxia EXAM: PORTABLE CHEST 1 VIEW COMPARISON:  04/26/2019,, 06/22/2022, CT 07/02/2023 FINDINGS: Lungs are symmetrically well expanded. Progressive airspace infiltrate has developed within the right apex which may relate to progressive chronic atypical or acute on chronic infection. Rounded mass at the right lung base is again seen, better seen on prior CT examination. No pneumothorax or pleural effusion. Cardiac size within limits. Pulmonary vascularity is. No acute bone abnormality. IMPRESSION: 1. Progressive airspace infiltrate within the right apex which may relate to progressive chronic atypical or acute on chronic infection. 2. Rounded mass at the right lung base, better seen on prior CT examination as a cavitary fluid-filled lesion. Electronically Signed   By: Helyn Numbers M.D.   On: 08/19/2023 19:28        Scheduled Meds:  diltiazem  180 mg Oral Daily   enoxaparin (LOVENOX) injection  40 mg Subcutaneous Q24H   guaiFENesin  600 mg Oral BID   ipratropium-albuterol  3 mL Nebulization QID   itraconazole  100 mg Oral BID WC   losartan  25 mg Oral Daily   Continuous Infusions:  0.9 % NaCl with KCl 20 mEq / L 100 mL/hr at 08/20/23  0236   azithromycin     cefTRIAXone (ROCEPHIN)  IV     lactated ringers Stopped (08/20/23 0236)     LOS: 1 day    Time spent: 55 minutes    Jakhari Space D Sherryll Burger, DO [facility]  If 7PM-7AM, please contact night-coverage [email or web address] 08/20/2023, 7:15 AM

## 2023-08-20 NOTE — ED Notes (Signed)
Pt spo2 dropping to 80s on 2l [place] while sleeping. Attending notified, paged respiratory at this time to evaluate for cpap need.

## 2023-08-20 NOTE — Progress Notes (Signed)
Nurse at bedside,patient out of bed to chair this am.Patient alert and oriented times four.Patient c/o low back pain rated a 8 sharp, oxycodone 15 mg's given by mouth per MAR prn per MD's orders. Call bell in reach. Plan of care on going.

## 2023-08-20 NOTE — Assessment & Plan Note (Signed)
-   We will continue with scheduled and as needed DuoNebs for now as well as mucolytic's. - Antibiotics will be provided as mentioned above. - I will hold off on steroids for now. - I will hold off on long-acting beta agonist as well.

## 2023-08-20 NOTE — Assessment & Plan Note (Signed)
-   We will continue antihypertensive therapy.

## 2023-08-20 NOTE — Progress Notes (Signed)
   08/20/23 1044  TOC Brief Assessment  Insurance and Status Reviewed  Patient has primary care physician Yes  Home environment has been reviewed from home  Prior level of function: independent  Social Drivers of Health Review SDOH reviewed interventions complete  Readmission risk has been reviewed Yes  Transition of care needs no transition of care needs at this time    Resource information provided to address SDOH concerns.  Transition of Care Department [facility]) has reviewed patient and no other TOC needs have been identified at this time. We will continue to monitor patient advancement through interdisciplinary progression rounds. If new patient transition needs arise, please place a TOC consult.

## 2023-08-20 NOTE — Assessment & Plan Note (Signed)
-   We will continue her itraconazole.

## 2023-08-20 NOTE — Plan of Care (Signed)
   Problem: Education: Goal: Knowledge of General Education information will improve Description Including pain rating scale, medication(s)/side effects and non-pharmacologic comfort measures Outcome: Progressing   Problem: Education: Goal: Knowledge of General Education information will improve Description Including pain rating scale, medication(s)/side effects and non-pharmacologic comfort measures Outcome: Progressing

## 2023-08-20 NOTE — ED Notes (Signed)
PATIENT ON bIPAP

## 2023-08-21 DIAGNOSIS — J189 Pneumonia, unspecified organism: Secondary | ICD-10-CM | POA: Diagnosis not present

## 2023-08-21 LAB — BASIC METABOLIC PANEL
Anion gap: 7 (ref 5–15)
BUN: 13 mg/dL (ref 8–23)
CO2: 28 mmol/L (ref 22–32)
Calcium: 8.5 mg/dL — ABNORMAL LOW (ref 8.9–10.3)
Chloride: 105 mmol/L (ref 98–111)
Creatinine, Ser: 0.49 mg/dL (ref 0.44–1.00)
GFR, Estimated: >60 mL/min (ref >60)
Glucose, Bld: 98 mg/dL (ref 70–99)
Potassium: 4 mmol/L (ref 3.5–5.1)
Sodium: 140 mmol/L (ref 135–145)

## 2023-08-21 LAB — CBC
HCT: 34.8 % — ABNORMAL LOW (ref 36.0–46.0)
Hemoglobin: 10.5 g/dL — ABNORMAL LOW (ref 12.0–15.0)
MCH: 29.8 pg (ref 26.0–34.0)
MCHC: 30.2 g/dL (ref 30.0–36.0)
MCV: 98.9 fL (ref 80.0–100.0)
Platelets: 239 10*3/uL (ref 150–400)
RBC: 3.52 MIL/uL — ABNORMAL LOW (ref 3.87–5.11)
RDW: 14.8 % (ref 11.5–15.5)
WBC: 7.2 10*3/uL (ref 4.0–10.5)
nRBC: 0 % (ref 0.0–0.2)

## 2023-08-21 LAB — RETICULOCYTES
Immature Retic Fract: 17.3 % — ABNORMAL HIGH (ref 2.3–15.9)
RBC.: 3.47 MIL/uL — ABNORMAL LOW (ref 3.87–5.11)
Retic Count, Absolute: 47.2 10*3/uL (ref 19.0–186.0)
Retic Ct Pct: 1.4 % (ref 0.4–3.1)

## 2023-08-21 LAB — IRON AND TIBC
Iron: 26 ug/dL — ABNORMAL LOW (ref 28–170)
Saturation Ratios: 11 % (ref 10.4–31.8)
TIBC: 233 ug/dL — ABNORMAL LOW (ref 250–450)
UIBC: 207 ug/dL

## 2023-08-21 LAB — FERRITIN: Ferritin: 33 ng/mL (ref 11–307)

## 2023-08-21 LAB — FOLATE: Folate: 7.8 ng/mL (ref >5.9)

## 2023-08-21 LAB — MAGNESIUM: Magnesium: 2.2 mg/dL (ref 1.7–2.4)

## 2023-08-21 LAB — VITAMIN B12: Vitamin B-12: 185 pg/mL (ref 180–914)

## 2023-08-21 MED ORDER — AZITHROMYCIN 500 MG PO TABS: 500.0000 mg | ORAL_TABLET | Freq: Every day | ORAL | 0 refills | Status: AC

## 2023-08-21 MED ORDER — ITRACONAZOLE 100 MG PO CAPS: 200.0000 mg | ORAL_CAPSULE | Freq: Two times a day (BID) | ORAL | 2 refills | Status: DC

## 2023-08-21 MED ORDER — CEFDINIR 300 MG PO CAPS: 300.0000 mg | ORAL_CAPSULE | Freq: Two times a day (BID) | ORAL | 0 refills | Status: AC

## 2023-08-21 MED ORDER — DM-GUAIFENESIN ER 30-600 MG PO TB12: 1.0000 | ORAL_TABLET | Freq: Two times a day (BID) | ORAL | 0 refills | Status: AC

## 2023-08-21 NOTE — Progress Notes (Signed)
 Mobility Specialist Progress Note:    08/21/23 1120  Mobility  Activity Ambulated with assistance in hallway;Ambulated with assistance to bathroom  Level of Assistance Standby assist, set-up cues, supervision of patient - no hands on  Assistive Device [employer] Ambulated (ft) 60 ft  Range of Motion/Exercises Active;All extremities  Activity Response Tolerated well  Mobility Referral Yes  Mobility visit 1 Mobility  Mobility Specialist Start Time (ACUTE ONLY) 1120  Mobility Specialist Stop Time (ACUTE ONLY) 1200  Mobility Specialist Time Calculation (min) (ACUTE ONLY) 40 min   Pt received in bed, agreeable to mobility. Required SBA to stand and ambulate with cane. Tolerated well, deferred O2 sats monitoring d/t urine incontinence during session. Returned to room, pt performed peri care on self. Left pt supine, alarm on. All needs met.   Delrae Hagey Mobility Specialist Please contact via Special Educational Needs Teacher or  Rehab office at [phone]

## 2023-08-21 NOTE — Progress Notes (Signed)
SATURATION QUALIFICATIONS: (This note is used to comply with regulatory documentation for home oxygen)  Patient Saturations on Room Air at Rest = 96%  Patient Saturations on Room Air while Ambulating = 86%  Patient Saturations on 2 Liters of oxygen while Ambulating = 93%  Please briefly explain why patient needs home oxygen: 

## 2023-08-21 NOTE — Progress Notes (Signed)
Nurse at bedside,patient oriented to person,place, and time,confused to situation.No c/o pain or discomfort noted this am. Patient's  oxygen saturation on 1 liter is 96 percent this am. Plan of carte on going.

## 2023-08-21 NOTE — Progress Notes (Signed)
Patient discharged home with instructions given on medications and follow up visits,patient and family verbalized understanding. Prescriptions sent to Pharmacy of choice documented on AVS. IV discontinued,catheter intact. Accompanied by staff to an awaiting vehicle. 

## 2023-08-21 NOTE — Progress Notes (Signed)
 Mobility Specialist Progress Note:    08/21/23 1300  Mobility  Activity Ambulated with assistance in room;Ambulated with assistance to bathroom  Level of Assistance Standby assist, set-up cues, supervision of patient - no hands on  Assistive Device [employer] Ambulated (ft) 40 ft  Range of Motion/Exercises Active;All extremities  Activity Response Tolerated well  Mobility Referral Yes  Mobility visit 1 Mobility  Mobility Specialist Start Time (ACUTE ONLY) 1300  Mobility Specialist Stop Time (ACUTE ONLY) 1325  Mobility Specialist Time Calculation (min) (ACUTE ONLY) 25 min   Pt received in bed, agreeable to mobility. Required SBA to stand and ambulate with cane. Tolerated well, SpO2 93% on RA at rest. SpO2 91% on RA during ambulation. O2 dropped, SpO2 88% on RA during ambulation. Recovered quickly with deep breathing and rest break, SpO2 92% on RA throughout rest of session. Pt says they use rescue inhaler as needed at home. Returned pt supine, daughter and NT at bedside. All needs met.   Teresa Edwards Mobility Specialist Please contact via Special Educational Needs Teacher or  Rehab office at [phone]

## 2023-08-21 NOTE — Plan of Care (Signed)
   Problem: Education: Goal: Knowledge of General Education information will improve Description: Including pain rating scale, medication(s)/side effects and non-pharmacologic comfort measures Outcome: Progressing   Problem: Health Behavior/Discharge Planning: Goal: Ability to manage health-related needs will improve Outcome: Progressing   Problem: Clinical Measurements: Goal: Ability to maintain clinical measurements within normal limits will improve Outcome: Progressing Goal: Will remain free from infection Outcome: Progressing Goal: Diagnostic test results will improve Outcome: Progressing Goal: Respiratory complications will improve Outcome: Progressing Goal: Cardiovascular complication will be avoided Outcome: Progressing   Problem: Clinical Measurements: Goal: Ability to maintain clinical measurements within normal limits will improve Outcome: Progressing Goal: Will remain free from infection Outcome: Progressing Goal: Diagnostic test results will improve Outcome: Progressing Goal: Respiratory complications will improve Outcome: Progressing Goal: Cardiovascular complication will be avoided Outcome: Progressing

## 2023-08-21 NOTE — Plan of Care (Signed)
   Problem: Education: Goal: Knowledge of General Education information will improve Description Including pain rating scale, medication(s)/side effects and non-pharmacologic comfort measures Outcome: Progressing   Problem: Education: Goal: Knowledge of General Education information will improve Description Including pain rating scale, medication(s)/side effects and non-pharmacologic comfort measures Outcome: Progressing

## 2023-08-21 NOTE — Discharge Summary (Signed)
 Physician Discharge Summary  Teresa Edwards FMW:[id number] DOB: [birth year] DOA: 08/19/2023  PCP: Trudy Vaughn FALCON, MD  Admit date: 08/19/2023  Discharge date: 08/21/2023  Admitted From:Home  Disposition:  Home  Recommendations for Outpatient Follow-up:  Follow up with PCP in 1-2 weeks Follow up with ID outpatient and continue on new Itraconazole  dose of 200mg  BID as prescribed Complete antibiotics for pneumonia as prescribed Continue home medications as prior  Home Health:None  Equipment/Devices:Has home 2-3L [place]  Discharge Condition:Stable  CODE STATUS: Full  Diet recommendation: Heart Healthy  Brief/Interim Summary: Teresa Edwards is a 73 y.o. Caucasian female with medical history significant for COPD, coronary artery disease, DDD, DVT, essential hypertension and necrotizing aspergillosis, who presented to the emergency room with acute onset of altered mental status with confusion and hypoxia.  Patient was admitted with acute metabolic encephalopathy in the setting of community-acquired pneumonia and also has COPD with acute exacerbation. She was maintained on IV antibiotics and can now be discharged on oral Omnicef  and azithromycin . Her itraconazole  dose was also adjusted per ID while admitted and she will follow up with pulmonology in the near future along with ID.   Discharge Diagnoses:  Principal Problem:   CAP (community acquired pneumonia) Active Problems:   COPD with acute exacerbation (HCC)   Chronic necrotizing pulmonary aspergillosis (HCC)   Essential hypertension   Acute encephalopathy  Principal discharge diagnosis: Acute metabolic encephalopathy secondary to CAP with associated COPD exacerbation.  Discharge Instructions  Discharge Instructions     Ambulatory referral to Infectious Disease   Complete by: As directed    Diet - low sodium heart healthy   Complete by: As directed    Increase activity slowly   Complete by: As directed       Allergies  as of 08/21/2023   No Known Allergies      Medication List     TAKE these medications    albuterol  (2.5 MG/3ML) 0.083% nebulizer solution Commonly known as: PROVENTIL  Take 3 mLs (2.5 mg total) by nebulization every 6 (six) hours as needed for wheezing or shortness of breath.   albuterol  108 (90 Base) MCG/ACT inhaler Commonly known as: VENTOLIN  HFA Inhale 2 puffs into the lungs every 6 (six) hours as needed for wheezing or shortness of breath.   azithromycin  500 MG tablet Commonly known as: Zithromax  Take 1 tablet (500 mg total) by mouth daily for 3 days.   cefdinir  300 MG capsule Commonly known as: OMNICEF  Take 1 capsule (300 mg total) by mouth 2 (two) times daily for 4 days.   dextromethorphan-guaiFENesin  30-600 MG 12hr tablet Commonly known as: MUCINEX  DM Take 1 tablet by mouth 2 (two) times daily for 7 days.   diltiazem  180 MG 24 hr capsule Commonly known as: CARDIZEM  CD Take 180 mg by mouth daily.   itraconazole  100 MG capsule Commonly known as: SPORANOX  Take 2 capsules (200 mg total) by mouth 2 (two) times daily with a meal. What changed: when to take this   losartan  25 MG tablet Commonly known as: COZAAR  Take 25 mg by mouth daily.   oxycodone  30 MG immediate release tablet Commonly known as: ROXICODONE  Take 15-30 mg by mouth every 4 (four) hours as needed for pain.   Trelegy Ellipta  200-62.5-25 MCG/ACT Aepb Generic drug: Fluticasone -Umeclidin-Vilant Inhale 1 puff into the lungs daily.   Trelegy Ellipta  200-62.5-25 MCG/ACT Aepb Generic drug: Fluticasone -Umeclidin-Vilant Inhale 1 puff into the lungs daily.        Follow-up Information  Trudy Vaughn FALCON, MD. Schedule an appointment as soon as possible for a visit in 1 week(s).   Specialty: Internal Medicine Contact information: [address] [place] [address] [phone]                No Known Allergies  Consultations: ID   Procedures/Studies: DG Chest Port 1 View Result  Date: 08/19/2023 CLINICAL DATA:  Sepsis, vomiting, nausea, hypoxia EXAM: PORTABLE CHEST 1 VIEW COMPARISON:  04/26/2019,, 06/22/2022, CT 07/02/2023 FINDINGS: Lungs are symmetrically well expanded. Progressive airspace infiltrate has developed within the right apex which may relate to progressive chronic atypical or acute on chronic infection. Rounded mass at the right lung base is again seen, better seen on prior CT examination. No pneumothorax or pleural effusion. Cardiac size within limits. Pulmonary vascularity is. No acute bone abnormality. IMPRESSION: 1. Progressive airspace infiltrate within the right apex which may relate to progressive chronic atypical or acute on chronic infection. 2. Rounded mass at the right lung base, better seen on prior CT examination as a cavitary fluid-filled lesion. Electronically Signed   By: Dorethia Molt M.D.   On: 08/19/2023 19:28     Discharge Exam: Vitals:   08/21/23 1033 08/21/23 1324  BP: 124/63 (!) 145/69  Pulse: 62 62  Resp: 20 15  Temp: 98.2 F (36.8 C) 99 F (37.2 C)  SpO2: 98% 94%   Vitals:   08/21/23 0421 08/21/23 0726 08/21/23 1033 08/21/23 1324  BP: (!) 154/70  124/63 (!) 145/69  Pulse: 69  62 62  Resp:   20 15  Temp: 98.4 F (36.9 C)  98.2 F (36.8 C) 99 F (37.2 C)  TempSrc: Oral  Oral Oral  SpO2: 91% 90% 98% 94%  Weight:      Height:        General: Pt is alert, awake, not in acute distress Cardiovascular: RRR, S1/S2 +, no rubs, no gallops Respiratory: CTA bilaterally, no wheezing, no rhonchi Abdominal: Soft, NT, ND, bowel sounds + Extremities: no edema, no cyanosis    The results of significant diagnostics from this hospitalization (including imaging, microbiology, ancillary and laboratory) are listed below for reference.     Microbiology: Recent Results (from the past 240 hours)  Resp panel by RT-PCR (RSV, Flu A&B, Covid) Anterior Nasal Swab     Status: None   Collection Time: 08/19/23  6:48 PM   Specimen: Anterior  Nasal Swab  Result Value Ref Range Status   SARS Coronavirus 2 by RT PCR NEGATIVE NEGATIVE Final    Comment: (NOTE) SARS-CoV-2 target nucleic acids are NOT DETECTED.  The SARS-CoV-2 RNA is generally detectable in upper respiratory specimens during the acute phase of infection. The lowest concentration of SARS-CoV-2 viral copies this assay can detect is 138 copies/mL. A negative result does not preclude SARS-Cov-2 infection and should not be used as the sole basis for treatment or other patient management decisions. A negative result may occur with  improper specimen collection/handling, submission of specimen other than nasopharyngeal swab, presence of viral mutation(s) within the areas targeted by this assay, and inadequate number of viral copies(<138 copies/mL). A negative result must be combined with clinical observations, patient history, and epidemiological information. The expected result is Negative.  Fact Sheet for Patients:  bloggercourse.com  Fact Sheet for Healthcare Providers:  seriousbroker.it  This test is no t yet approved or cleared by the [place]  FDA and  has been authorized for detection and/or diagnosis of SARS-CoV-2 by FDA under an Emergency Use Authorization (  EUA). This EUA will remain  in effect (meaning this test can be used) for the duration of the COVID-19 declaration under Section 564(b)(1) of the Act, 21 U.S.C.section 360bbb-3(b)(1), unless the authorization is terminated  or revoked sooner.       Influenza A by PCR NEGATIVE NEGATIVE Final   Influenza B by PCR NEGATIVE NEGATIVE Final    Comment: (NOTE) The Xpert Xpress SARS-CoV-2/FLU/RSV plus assay is intended as an aid in the diagnosis of influenza from Nasopharyngeal swab specimens and should not be used as a sole basis for treatment. Nasal washings and aspirates are unacceptable for Xpert Xpress SARS-CoV-2/FLU/RSV testing.  Fact Sheet for  Patients: bloggercourse.com  Fact Sheet for Healthcare Providers: seriousbroker.it  This test is not yet approved or cleared by the [place]  FDA and has been authorized for detection and/or diagnosis of SARS-CoV-2 by FDA under an Emergency Use Authorization (EUA). This EUA will remain in effect (meaning this test can be used) for the duration of the COVID-19 declaration under Section 564(b)(1) of the Act, 21 U.S.C. section 360bbb-3(b)(1), unless the authorization is terminated or revoked.     Resp Syncytial Virus by PCR NEGATIVE NEGATIVE Final    Comment: (NOTE) Fact Sheet for Patients: bloggercourse.com  Fact Sheet for Healthcare Providers: seriousbroker.it  This test is not yet approved or cleared by the [place]  FDA and has been authorized for detection and/or diagnosis of SARS-CoV-2 by FDA under an Emergency Use Authorization (EUA). This EUA will remain in effect (meaning this test can be used) for the duration of the COVID-19 declaration under Section 564(b)(1) of the Act, 21 U.S.C. section 360bbb-3(b)(1), unless the authorization is terminated or revoked.  Performed at [facility], [address] [place], [place] [address]   Blood Culture (routine x 2)     Status: None (Preliminary result)   Collection Time: 08/19/23  7:00 PM   Specimen: BLOOD RIGHT FOREARM  Result Value Ref Range Status   Specimen Description BLOOD RIGHT FOREARM BLOOD  Final   Special Requests   Final    BOTTLES DRAWN AEROBIC AND ANAEROBIC Blood Culture adequate volume   Culture   Final    NO GROWTH 2 DAYS Performed at [facility], [address], [place], [place] [address]    Report Status PENDING  Incomplete  Blood Culture (routine x 2)     Status: None (Preliminary result)   Collection Time: 08/19/23  8:08 PM   Specimen: BLOOD LEFT FOREARM  Result Value Ref Range Status   Specimen  Description BLOOD LEFT FOREARM BLOOD  Final   Special Requests   Final    BOTTLES DRAWN AEROBIC ONLY Blood Culture results may not be optimal due to an inadequate volume of blood received in culture bottles   Culture   Final    NO GROWTH 2 DAYS Performed at [facility], [address] [place], [place] [address]    Report Status PENDING  Incomplete     Labs: BNP (last 3 results) No results for input(s): BNP in the last 8760 hours. Basic Metabolic Panel: Recent Labs  Lab 08/19/23 2008 08/19/23 2241 08/20/23 0427 08/21/23 0403  NA 142  --  142 140  K 2.6*  --  3.4* 4.0  CL 102  --  108 105  CO2 27  --  24 28  GLUCOSE 114*  --  70 98  BUN 19  --  13 13  CREATININE 0.66  --  0.44 0.49  CALCIUM 8.6*  --  8.4* 8.5*  MG  --  2.2  --  2.2   Liver Function Tests: Recent Labs  Lab 08/19/23 2008  AST 18  ALT 9  ALKPHOS 73  BILITOT 0.4  PROT 6.6  ALBUMIN 3.1*   No results for input(s): LIPASE, AMYLASE in the last 168 hours. No results for input(s): AMMONIA in the last 168 hours. CBC: Recent Labs  Lab 08/19/23 2008 08/20/23 0427 08/21/23 0403  WBC 7.8 6.7 7.2  NEUTROABS 5.5  --   --   HGB 11.9* 11.6* 10.5*  HCT 39.1 37.5 34.8*  MCV 97.8 100.0 98.9  PLT 277 202 239   Cardiac Enzymes: No results for input(s): CKTOTAL, CKMB, CKMBINDEX, TROPONINI in the last 168 hours. BNP: Invalid input(s): POCBNP CBG: No results for input(s): GLUCAP in the last 168 hours. D-Dimer No results for input(s): DDIMER in the last 72 hours. Hgb A1c No results for input(s): HGBA1C in the last 72 hours. Lipid Profile No results for input(s): CHOL, HDL, LDLCALC, TRIG, CHOLHDL, LDLDIRECT in the last 72 hours. Thyroid  function studies No results for input(s): TSH, T4TOTAL, T3FREE, THYROIDAB in the last 72 hours.  Invalid input(s): FREET3 Anemia work up Recent Labs    08/21/23 0403  VITAMINB12 185  FOLATE 7.8  FERRITIN 33  TIBC 233*   IRON 26*  RETICCTPCT 1.4   Urinalysis    Component Value Date/Time   COLORURINE YELLOW 08/19/2023 1849   APPEARANCEUR HAZY (A) 08/19/2023 1849   LABSPEC 1.024 08/19/2023 1849   PHURINE 5.0 08/19/2023 1849   GLUCOSEU NEGATIVE 08/19/2023 1849   HGBUR NEGATIVE 08/19/2023 1849   BILIRUBINUR NEGATIVE 08/19/2023 1849   KETONESUR NEGATIVE 08/19/2023 1849   PROTEINUR 100 (A) 08/19/2023 1849   NITRITE NEGATIVE 08/19/2023 1849   LEUKOCYTESUR NEGATIVE 08/19/2023 1849   Sepsis Labs Recent Labs  Lab 08/19/23 2008 08/20/23 0427 08/21/23 0403  WBC 7.8 6.7 7.2   Microbiology Recent Results (from the past 240 hours)  Resp panel by RT-PCR (RSV, Flu A&B, Covid) Anterior Nasal Swab     Status: None   Collection Time: 08/19/23  6:48 PM   Specimen: Anterior Nasal Swab  Result Value Ref Range Status   SARS Coronavirus 2 by RT PCR NEGATIVE NEGATIVE Final    Comment: (NOTE) SARS-CoV-2 target nucleic acids are NOT DETECTED.  The SARS-CoV-2 RNA is generally detectable in upper respiratory specimens during the acute phase of infection. The lowest concentration of SARS-CoV-2 viral copies this assay can detect is 138 copies/mL. A negative result does not preclude SARS-Cov-2 infection and should not be used as the sole basis for treatment or other patient management decisions. A negative result may occur with  improper specimen collection/handling, submission of specimen other than nasopharyngeal swab, presence of viral mutation(s) within the areas targeted by this assay, and inadequate number of viral copies(<138 copies/mL). A negative result must be combined with clinical observations, patient history, and epidemiological information. The expected result is Negative.  Fact Sheet for Patients:  bloggercourse.com  Fact Sheet for Healthcare Providers:  seriousbroker.it  This test is no t yet approved or cleared by the [place]  FDA and   has been authorized for detection and/or diagnosis of SARS-CoV-2 by FDA under an Emergency Use Authorization (EUA). This EUA will remain  in effect (meaning this test can be used) for the duration of the COVID-19 declaration under Section 564(b)(1) of the Act, 21 U.S.C.section 360bbb-3(b)(1), unless the authorization is terminated  or revoked sooner.  Influenza A by PCR NEGATIVE NEGATIVE Final   Influenza B by PCR NEGATIVE NEGATIVE Final    Comment: (NOTE) The Xpert Xpress SARS-CoV-2/FLU/RSV plus assay is intended as an aid in the diagnosis of influenza from Nasopharyngeal swab specimens and should not be used as a sole basis for treatment. Nasal washings and aspirates are unacceptable for Xpert Xpress SARS-CoV-2/FLU/RSV testing.  Fact Sheet for Patients: bloggercourse.com  Fact Sheet for Healthcare Providers: seriousbroker.it  This test is not yet approved or cleared by the [place]  FDA and has been authorized for detection and/or diagnosis of SARS-CoV-2 by FDA under an Emergency Use Authorization (EUA). This EUA will remain in effect (meaning this test can be used) for the duration of the COVID-19 declaration under Section 564(b)(1) of the Act, 21 U.S.C. section 360bbb-3(b)(1), unless the authorization is terminated or revoked.     Resp Syncytial Virus by PCR NEGATIVE NEGATIVE Final    Comment: (NOTE) Fact Sheet for Patients: bloggercourse.com  Fact Sheet for Healthcare Providers: seriousbroker.it  This test is not yet approved or cleared by the [place]  FDA and has been authorized for detection and/or diagnosis of SARS-CoV-2 by FDA under an Emergency Use Authorization (EUA). This EUA will remain in effect (meaning this test can be used) for the duration of the COVID-19 declaration under Section 564(b)(1) of the Act, 21 U.S.C. section 360bbb-3(b)(1),  unless the authorization is terminated or revoked.  Performed at [facility], [address], [place], [place] [address]   Blood Culture (routine x 2)     Status: None (Preliminary result)   Collection Time: 08/19/23  7:00 PM   Specimen: BLOOD RIGHT FOREARM  Result Value Ref Range Status   Specimen Description BLOOD RIGHT FOREARM BLOOD  Final   Special Requests   Final    BOTTLES DRAWN AEROBIC AND ANAEROBIC Blood Culture adequate volume   Culture   Final    NO GROWTH 2 DAYS Performed at [facility], [address] [place], [place] [address]    Report Status PENDING  Incomplete  Blood Culture (routine x 2)     Status: None (Preliminary result)   Collection Time: 08/19/23  8:08 PM   Specimen: BLOOD LEFT FOREARM  Result Value Ref Range Status   Specimen Description BLOOD LEFT FOREARM BLOOD  Final   Special Requests   Final    BOTTLES DRAWN AEROBIC ONLY Blood Culture results may not be optimal due to an inadequate volume of blood received in culture bottles   Culture   Final    NO GROWTH 2 DAYS Performed at [facility], [address] [place], [place] [address]    Report Status PENDING  Incomplete     Time coordinating discharge: 35 minutes  SIGNED:   Adron JONETTA Fairly, DO [facility] 08/21/2023, 2:09 PM  If 7PM-7AM, please contact night-coverage [email or web address]

## 2023-08-21 NOTE — Progress Notes (Signed)
Nurse at bedside,Patient ambulating in hallway with front wheel walker times one assist.Patient very weak, tolerated ambulation.Family at bedside. Plan of care on going.

## 2023-08-24 LAB — CULTURE, BLOOD (ROUTINE X 2)
Culture: NO GROWTH
Culture: NO GROWTH
Special Requests: ADEQUATE

## 2023-08-28 ENCOUNTER — Ambulatory Visit: Payer: 59 | Admitting: Pulmonary Disease

## 2023-08-30 DIAGNOSIS — I9589 Other hypotension: Secondary | ICD-10-CM | POA: Diagnosis not present

## 2023-08-30 DIAGNOSIS — B449 Aspergillosis, unspecified: Secondary | ICD-10-CM | POA: Diagnosis not present

## 2023-08-30 DIAGNOSIS — Z681 Body mass index (BMI) 19 or less, adult: Secondary | ICD-10-CM | POA: Diagnosis not present

## 2023-09-04 ENCOUNTER — Telehealth: Payer: Self-pay

## 2023-09-04 NOTE — Telephone Encounter (Signed)
 Patient called to cancel appt. States she does not want be seen by infectious disease until she sees Dr. Judeth Horn at pulmonology. Tried to explain reason for appt, but would prefer to see pulmonology first.. Advised she reach out to Dr. Laurena Spies office for earlier appt since she is not scheduled to see him until April. Juanita Laster, RMA

## 2023-09-05 ENCOUNTER — Ambulatory Visit: Payer: 59 | Admitting: Infectious Disease

## 2023-09-12 ENCOUNTER — Telehealth: Payer: Self-pay | Admitting: Pulmonary Disease

## 2023-09-12 NOTE — Telephone Encounter (Signed)
 Patient had pneumonia and was released from the hospital. She needs antibiotics to clear up the rest of it. Please call and advise. [phone]  Pharmacy: [facility] in [place]

## 2023-09-12 NOTE — Telephone Encounter (Signed)
 PT is ret [facility]. Please try again.

## 2023-09-12 NOTE — Telephone Encounter (Signed)
 Lm for patient.

## 2023-09-13 MED ORDER — AMOXICILLIN-POT CLAVULANATE 875-125 MG PO TABS: 1.0000 | ORAL_TABLET | Freq: Two times a day (BID) | ORAL | 0 refills | Status: DC

## 2023-09-13 NOTE — Telephone Encounter (Signed)
 Spoke to patient. She would like to switch from Dr. Vassie Loll to Dr. Judeth Horn. She stated that she did not have a good experience at her last visit.    Routing to both Dr. Judeth Horn and Dr. Vassie Loll to approve switch.

## 2023-09-13 NOTE — Telephone Encounter (Signed)
 I called and spoke with pt. Pt states she needs antibiotics to clear up the rest of her pneumonia. She is coughing up thick brown mucous and was dx with pneumonia 2-3 weeks ago at [facility]. Pt states she is suppose to see Dr Judeth Horn in April. Forwarding to Dr [facility] for recommendations.

## 2023-09-13 NOTE — Telephone Encounter (Signed)
 Patient is aware of below message and voiced her understanding. Previously Dr. Thora Lance patient.  No sooner availability with APP.   Front, will you add patient to wait list?

## 2023-09-13 NOTE — Telephone Encounter (Signed)
No problem with me. 

## 2023-09-13 NOTE — Telephone Encounter (Signed)
 Why is she switching providers again? Augmentin sent. Needs OV with APP sooner for f/u.

## 2023-09-13 NOTE — Telephone Encounter (Signed)
 Message sent to front to add patient to wait list.  Dr. Judeth Horn, would you like to double book?

## 2023-09-13 NOTE — Telephone Encounter (Signed)
 Noted.    Front, please ensure patient is added to wait list.

## 2023-09-13 NOTE — Telephone Encounter (Signed)
 Will await Dr. Reginia Naas response.

## 2023-09-14 DIAGNOSIS — M539 Dorsopathy, unspecified: Secondary | ICD-10-CM | POA: Diagnosis not present

## 2023-09-14 DIAGNOSIS — M545 Low back pain, unspecified: Secondary | ICD-10-CM | POA: Diagnosis not present

## 2023-09-14 DIAGNOSIS — E876 Hypokalemia: Secondary | ICD-10-CM | POA: Diagnosis not present

## 2023-09-14 DIAGNOSIS — R5383 Other fatigue: Secondary | ICD-10-CM | POA: Diagnosis not present

## 2023-09-14 DIAGNOSIS — Z681 Body mass index (BMI) 19 or less, adult: Secondary | ICD-10-CM | POA: Diagnosis not present

## 2023-09-14 DIAGNOSIS — G8929 Other chronic pain: Secondary | ICD-10-CM | POA: Diagnosis not present

## 2023-09-14 DIAGNOSIS — I10 Essential (primary) hypertension: Secondary | ICD-10-CM | POA: Diagnosis not present

## 2023-09-14 DIAGNOSIS — E7800 Pure hypercholesterolemia, unspecified: Secondary | ICD-10-CM | POA: Diagnosis not present

## 2023-09-14 DIAGNOSIS — E119 Type 2 diabetes mellitus without complications: Secondary | ICD-10-CM | POA: Diagnosis not present

## 2023-10-15 DIAGNOSIS — I10 Essential (primary) hypertension: Secondary | ICD-10-CM | POA: Diagnosis not present

## 2023-10-15 DIAGNOSIS — G894 Chronic pain syndrome: Secondary | ICD-10-CM | POA: Diagnosis not present

## 2023-10-15 DIAGNOSIS — Z79899 Other long term (current) drug therapy: Secondary | ICD-10-CM | POA: Diagnosis not present

## 2023-10-15 DIAGNOSIS — E7800 Pure hypercholesterolemia, unspecified: Secondary | ICD-10-CM | POA: Diagnosis not present

## 2023-10-16 ENCOUNTER — Ambulatory Visit (INDEPENDENT_AMBULATORY_CARE_PROVIDER_SITE_OTHER): Payer: 59 | Admitting: Pulmonary Disease

## 2023-10-16 ENCOUNTER — Ambulatory Visit

## 2023-10-16 ENCOUNTER — Encounter: Payer: Self-pay | Admitting: Pulmonary Disease

## 2023-10-16 VITALS — BP 170/75 | HR 83 | Ht 64.0 in | Wt 107.8 lb

## 2023-10-16 DIAGNOSIS — J189 Pneumonia, unspecified organism: Secondary | ICD-10-CM | POA: Diagnosis not present

## 2023-10-16 DIAGNOSIS — J929 Pleural plaque without asbestos: Secondary | ICD-10-CM | POA: Diagnosis not present

## 2023-10-16 DIAGNOSIS — J984 Other disorders of lung: Secondary | ICD-10-CM | POA: Diagnosis not present

## 2023-10-16 DIAGNOSIS — J449 Chronic obstructive pulmonary disease, unspecified: Secondary | ICD-10-CM | POA: Diagnosis not present

## 2023-10-16 DIAGNOSIS — R918 Other nonspecific abnormal finding of lung field: Secondary | ICD-10-CM | POA: Diagnosis not present

## 2023-10-16 MED ORDER — TRELEGY ELLIPTA 200-62.5-25 MCG/ACT IN AEPB: 1.0000 | INHALATION_SPRAY | Freq: Every day | RESPIRATORY_TRACT | 11 refills | Status: AC

## 2023-10-16 NOTE — Patient Instructions (Addendum)
 Dr. Daiva Eves at [facility] for Infectious Disease - [phone] -please call and reschedule your appointment you had with him in February  I refilled the Trelegy  Continue itraconazole for now, Dr. Daiva Eves can help [place] decide best medications to treat the Aspergillus issue in your lungs.  Repeat chest x-ray today  Return to clinic in 3 months or sooner as needed with Dr. Judeth Horn

## 2023-10-16 NOTE — Progress Notes (Signed)
 Synopsis: Referred for pulmonary nodule by Donetta Potts, MD  Subjective:   PATIENT ID: Teresa Edwards File GENDER: female DOB: [birth year], MRN: [id number]  Chief Complaint  Patient presents with   Follow-up    73 y.o. with COPD referred for pulmonary nodules (dominant 1.6cm RML decreased from  2.69mm on 04/01/21 scan) found to have ABPA versus pulmonary aspergillosis based on bronchoscopy with biopsy.  Discharge summary 08/2023 reviewed.  Multiple pulmonary notes reviewed.  History is a bit scattered and hard to ascertain.  Some conflicting reports at times.  Initially seen by Dr. Thora Lance.  Placed on posaconazole for ABPA versus pulmonary aspergillosis.  APA considered given elevated eosinophils as well as IgE if greater than 2000.  On subsequent CT scan 02/2023 cavitary lesion right lower lobe pain improved in size, it improved prior as well.  01/2023 he was changed to itraconazole from posaconazole.  Based on report from patient and daughter, she did not take this medication from 01/2023 until 07/2023.  She had a CT scan 06/2023 in the [facility] system that does worsening right lower lobe cavitary lesion as well as scattered new nodules throughout the right concerning for worsening pulmonary aspergillosis given multiple nodules and a rapid increase in size or development of her 43-month timeframe.  It seems like she started taking her itraconazole again at the behest of her PCP 07/2023.  She was hospitalized 2/25 with pneumonia.  Nodular opacities markedly worse from prior chest x-ray consistent with most recent CT scan 06/2023.  She did not follow-up with infectious disease 08/2023 after hospital discharge as she had previously been scheduled.  She canceled prior pulmonary appointment 08/2023 as well.  We discussed at length likely this is a worsening ABPA versus pulmonary aspergillosis.  Stressed importance of azole therapy.  Stressed importance of following up with infectious disease to help monitor and  alter therapy as needed.   HPI initial visit: She smoked 50 years (~50 py smoking) active, down to 1 cigarette per day. She does have quite a bit of chest congestion. Finished course of antibiotics on Monday and that has helped quite a bit. She has no trouble swallowing, no aspiration. She has had no fever, she has had no weight loss, no night sweats drenching the sheets.   She has DOE to 50 yards. She is on trelegy. She has never done pulmonary rehab.   She has never had a bronchoscopy or TTNA.   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 She has no pets at home. No pet bird. No hot tub   Past Medical History:  Diagnosis Date   Aortic atherosclerosis (HCC)    Cavitary lung disease    Chronic pain    COPD (chronic obstructive pulmonary disease) (HCC)    Coronary artery calcification seen on CT scan    DDD (degenerative disc disease)    DVT femoral (deep venous thrombosis) with thrombophlebitis (HCC)    Dyspnea    Pt states she has had for months   Hypertension      Family History  Problem Relation Age of Onset   Deep vein thrombosis Mother    Other Mother        varicose veins   Hypertension Sister    Other Sister        varicose veins   Cancer Brother  Past Surgical History:  Procedure Laterality Date   BRONCHIAL BIOPSY  06/22/2022   Procedure: BRONCHIAL BIOPSIES;  Surgeon: Omar Person, MD;  Location: [facility] ENDOSCOPY;  Service: Pulmonary;;   BRONCHIAL NEEDLE ASPIRATION BIOPSY  06/22/2022   Procedure: BRONCHIAL NEEDLE ASPIRATION BIOPSIES;  Surgeon: Omar Person, MD;  Location: [facility] ENDOSCOPY;  Service: Pulmonary;;   BRONCHIAL WASHINGS  06/22/2022   Procedure: BRONCHIAL WASHINGS;  Surgeon: Omar Person, MD;  Location: [facility] ENDOSCOPY;  Service: Pulmonary;;   TOTAL HIP ARTHROPLASTY Right    TUBAL LIGATION  10/1971     Social History   Socioeconomic History   Marital status: Widowed    Spouse name: Not on file   Number of children: 2   Years of education: 3   Highest education level: Not on file  Occupational History   Occupation: homemaker  Tobacco Use   Smoking status: Every Day    Current packs/day: 3.00    Average packs/day: 3.0 packs/day for 40.0 years (120.0 ttl pk-yrs)    Types: Cigarettes   Smokeless tobacco: Never   Tobacco comments:    Smoking about 1 ppd.  She says she cuts back.  01/16/2023 hfb  Vaping Use   Vaping status: Never Used  Substance and Sexual Activity   Alcohol use: No    Alcohol/week: 0.0 standard drinks of alcohol   Drug use: No   Sexual activity: Not on file  Other Topics Concern   Not on file  Social History Narrative   Not on file   Social Drivers of Health   Financial Resource Strain: Not on file  Food Insecurity: Food Insecurity Present (08/20/2023)   Hunger Vital Sign    Worried About Running Out of Food in the Last Year: Sometimes true    Ran Out of Food in the Last Year: Sometimes true  Transportation Needs: Unmet Transportation Needs (08/20/2023)   PRAPARE - Administrator, Civil Service (Medical): Yes    Lack of Transportation (Non-Medical): Yes  Physical Activity: Not on file  Stress: Not on file  Social Connections: Socially Isolated (08/20/2023)   Social Connection and Isolation Panel [NHANES]    Frequency of Communication with Friends and Family: Once a week    Frequency of Social Gatherings with Friends and Family: More than three times a week    Attends Religious Services: Never    Database administrator or Organizations: No    Attends Banker Meetings: Never    Marital Status: Widowed  Intimate Partner Violence: Not At Risk (08/20/2023)   Humiliation, Afraid, Rape, and Kick questionnaire    Fear of Current or Ex-Partner: No    Emotionally Abused: No    Physically Abused: No    Sexually Abused: No     No Known  Allergies   Outpatient Medications Prior to Visit  Medication Sig Dispense Refill   albuterol (PROVENTIL) (2.5 MG/3ML) 0.083% nebulizer solution Take 3 mLs (2.5 mg total) by nebulization every 6 (six) hours as needed for wheezing or shortness of breath. 75 mL 12   albuterol (VENTOLIN HFA) 108 (90 Base) MCG/ACT inhaler Inhale 2 puffs into the lungs every 6 (six) hours as needed for wheezing or shortness of breath. 18 g 6   diltiazem (CARDIZEM CD) 180 MG 24 hr capsule Take 180 mg by mouth daily.     itraconazole (SPORANOX) 100 MG capsule Take 2 capsules (200 mg total) by mouth 2 (two) times daily with a meal. 120 capsule 2  losartan (COZAAR) 25 MG tablet Take 25 mg by mouth daily.     oxyCODONE-acetaminophen (PERCOCET) 10-325 MG tablet Take 1 tablet by mouth 2 (two) times daily as needed.     Fluticasone-Umeclidin-Vilant (TRELEGY ELLIPTA) 200-62.5-25 MCG/ACT AEPB Inhale 1 puff into the lungs daily. 60 each 6   amoxicillin-clavulanate (AUGMENTIN) 875-125 MG tablet Take 1 tablet by mouth 2 (two) times daily. (Patient not taking: Reported on 10/16/2023) 14 tablet 0   oxycodone (ROXICODONE) 30 MG immediate release tablet Take 15-30 mg by mouth every 4 (four) hours as needed for pain.  (Patient not taking: Reported on 10/16/2023)     No facility-administered medications prior to visit.       Objective:   Physical Exam:  General appearance: 73 y.o., female, NAD, conversant, chroincally ill appearing Eyes: anicteric sclerae, moist conjunctivae HENT: NCAT Neck: Trachea midline; no JVD Lungs: diminished b/l, no crackles, no wheeze, with normal respiratory effort CV: RRR, no MRGs  Abdomen: Soft, non-tender; non-distended Extremities: No peripheral edem Neuro: Alert and oriented to person and place, no focal deficit    Vitals:   10/16/23 1338  BP: (!) 170/75  Pulse: 83  SpO2: (!) 89%  Weight: 107 lb 12.8 oz (48.9 kg)  Height: 5\' 4"  (1.626 m)    (!) 89% on RA BMI Readings from Last 3  Encounters:  10/16/23 18.50 kg/m  08/19/23 18.37 kg/m  03/23/23 19.53 kg/m   Wt Readings from Last 3 Encounters:  10/16/23 107 lb 12.8 oz (48.9 kg)  08/19/23 107 lb (48.5 kg)  03/23/23 113 lb 12.8 oz (51.6 kg)     CBC    Component Value Date/Time   WBC 7.2 08/21/2023 0403   RBC 3.52 (L) 08/21/2023 0403   RBC 3.47 (L) 08/21/2023 0403   HGB 10.5 (L) 08/21/2023 0403   HCT 34.8 (L) 08/21/2023 0403   PLT 239 08/21/2023 0403   MCV 98.9 08/21/2023 0403   MCH 29.8 08/21/2023 0403   MCHC 30.2 08/21/2023 0403   RDW 14.8 08/21/2023 0403   LYMPHSABS 1.3 08/19/2023 2008   MONOABS 0.6 08/19/2023 2008   EOSABS 0.3 08/19/2023 2008   BASOSABS 0.0 08/19/2023 2008   Quant gold negative, ANCAs neg, AFB sputum 9/22 neg  Chest Imaging:  CTA 10/2016 reviewed by me and remarkable for significant TIB nodularity esp RML, RLL, smaller RLLL cavitary lesion  CT Chest 04/01/21 reviewed by me and remarkable for  Interval decrease in size of RML nodule relative to CTA Chest 01/2021  CTA Chest 03/02/22 reviewed by me with multiple stable cavitary nodules  CT Chest 04/19/22 reviewed by me - RUL apical lesion does look a bit larger/more solid than prior but agree otherwise remarkable for waxing/waning pulm nodules  CT Chest 09/22/22 with decreased size of RLL consolidation/cavitary lesion, other nodules stable  CT chest 02/2023 with decreased size of right lower lobe consolidation/cavitary lesion, overall improvement throughout  CT chest outside hospital 12/24 worsening right lower lobe cavitary lesion and scattered new nodules throughout concerning for inflammatory infiltrate on my review interpretation given rapid development and multiple nodules  Pulmonary Functions Testing Results:     No data to display              Assessment & Plan:   # Pulmonary nodules # Chronic cavitary pulmonary aspergillosis s/p nav bronch 06/2022 initially improved on azole therapy subsequent worsening 06/2023  while off antifungals She may simply have chronic pulmonary aspergillosis but elevated Eos, IgE greater than 2000 raises had a  concern for ABPA.  Initial improvement on nasal therapy with worsening and multiple new nodules while off therapy.  Most likely worsening disease.  No worsening pulmonary symptoms cough or shortness of breath which is reassuring.  -CT Chest at end of 6 month azole course (August 2024) demonstrated improvement -Switch to itraconazole 01/2023 with subsequent nonadherence from then until about January 2025 -CT chest 06/2023 with worsening ABPA versus aspergillosis findings -Similar worsening on chest x-ray 08/2023 admitted with "pneumonia" -continue trelegy 200, refilled -Instructed to schedule repeat follow-up with infectious disease doctor, it was canceled 08/2023 as she was still too weak after hospitalization to help with therapy -Would like to repeat CT scan would like to give a few months on azole therapy first, will tentatively plan to repeat in the summer, new order today  Return to clinic in 3 months or sooner if needed with Dr. Orland Mustard, MD [facility] 10/16/2023 4:15 PM   I spent 41 minutes during the course of the visit including face-to-face visit, coordination of care, review of records.

## 2023-10-18 DIAGNOSIS — Z79899 Other long term (current) drug therapy: Secondary | ICD-10-CM | POA: Diagnosis not present

## 2023-10-25 DIAGNOSIS — Z0001 Encounter for general adult medical examination with abnormal findings: Secondary | ICD-10-CM | POA: Diagnosis not present

## 2023-10-25 DIAGNOSIS — Z681 Body mass index (BMI) 19 or less, adult: Secondary | ICD-10-CM | POA: Diagnosis not present

## 2023-10-25 DIAGNOSIS — B449 Aspergillosis, unspecified: Secondary | ICD-10-CM | POA: Diagnosis not present

## 2023-10-25 DIAGNOSIS — F17210 Nicotine dependence, cigarettes, uncomplicated: Secondary | ICD-10-CM | POA: Diagnosis not present

## 2023-10-25 DIAGNOSIS — J449 Chronic obstructive pulmonary disease, unspecified: Secondary | ICD-10-CM | POA: Diagnosis not present

## 2023-10-25 DIAGNOSIS — R030 Elevated blood-pressure reading, without diagnosis of hypertension: Secondary | ICD-10-CM | POA: Diagnosis not present

## 2023-10-25 DIAGNOSIS — R7303 Prediabetes: Secondary | ICD-10-CM | POA: Diagnosis not present

## 2023-10-25 DIAGNOSIS — Z1389 Encounter for screening for other disorder: Secondary | ICD-10-CM | POA: Diagnosis not present

## 2023-11-08 DIAGNOSIS — R7303 Prediabetes: Secondary | ICD-10-CM | POA: Diagnosis not present

## 2023-11-08 DIAGNOSIS — I9589 Other hypotension: Secondary | ICD-10-CM | POA: Diagnosis not present

## 2023-11-08 DIAGNOSIS — E876 Hypokalemia: Secondary | ICD-10-CM | POA: Diagnosis not present

## 2023-11-08 DIAGNOSIS — R5383 Other fatigue: Secondary | ICD-10-CM | POA: Diagnosis not present

## 2023-11-15 DIAGNOSIS — R030 Elevated blood-pressure reading, without diagnosis of hypertension: Secondary | ICD-10-CM | POA: Diagnosis not present

## 2023-11-15 DIAGNOSIS — J449 Chronic obstructive pulmonary disease, unspecified: Secondary | ICD-10-CM | POA: Diagnosis not present

## 2023-11-15 DIAGNOSIS — F17210 Nicotine dependence, cigarettes, uncomplicated: Secondary | ICD-10-CM | POA: Diagnosis not present

## 2023-11-15 DIAGNOSIS — Z681 Body mass index (BMI) 19 or less, adult: Secondary | ICD-10-CM | POA: Diagnosis not present

## 2023-11-15 DIAGNOSIS — B449 Aspergillosis, unspecified: Secondary | ICD-10-CM | POA: Diagnosis not present

## 2023-11-15 DIAGNOSIS — R7303 Prediabetes: Secondary | ICD-10-CM | POA: Diagnosis not present

## 2023-11-16 DIAGNOSIS — Z780 Asymptomatic menopausal state: Secondary | ICD-10-CM | POA: Diagnosis not present

## 2023-11-16 DIAGNOSIS — M545 Low back pain, unspecified: Secondary | ICD-10-CM | POA: Diagnosis not present

## 2023-11-16 DIAGNOSIS — R29818 Other symptoms and signs involving the nervous system: Secondary | ICD-10-CM | POA: Diagnosis not present

## 2023-11-16 DIAGNOSIS — G8929 Other chronic pain: Secondary | ICD-10-CM | POA: Diagnosis not present

## 2023-11-16 DIAGNOSIS — Z1231 Encounter for screening mammogram for malignant neoplasm of breast: Secondary | ICD-10-CM | POA: Diagnosis not present

## 2023-11-16 DIAGNOSIS — J449 Chronic obstructive pulmonary disease, unspecified: Secondary | ICD-10-CM | POA: Diagnosis not present

## 2023-11-20 ENCOUNTER — Telehealth: Payer: Self-pay

## 2023-11-20 NOTE — Telephone Encounter (Signed)
 Copied from CRM [id number]. Topic: General - Other >> Nov 19, 2023  4:16 PM Hilton Lucky wrote: Reason for CRM: Patient is calling to state that her PCP told her to contact pulmonary. States she has an antique oxygen  machine and would like a newer, preferably portable if possible, as her current is far too heavy to tote around.   Lm for pt.

## 2023-11-23 NOTE — Telephone Encounter (Signed)
 Copied from CRM [id number]. Topic: Clinical - Order For Equipment >> Nov 22, 2023  2:33 PM Hilton Lucky wrote: Reason for CRM: Patient is calling in to return a call to [facility]. Patient states no message left, just a name of who is calling. Please return call to patient. Otherwise, patient will be in-office Monday and will inquire then.  Spoke with [employer]. Pt states POC is about 73 yrs old and is too heavy. Uses O2  needed. Pt is unsure on DME company, thinks it may be [facility] in [place] but they might be out of business. Pt has OV 01/16/24. Dr. Marygrace Snellen can you please advise

## 2023-11-24 NOTE — Progress Notes (Unsigned)
 Subjective:  Reason for Infectious Disease Consult:allergic bronchopulmonary aspergillosis, , +/- invasive disease  Requesting Physician: Doreene Gammon, MD, Orbie Binder, MD   Patient ID: Teresa Edwards, female    DOB: Mar 26, 1951, 73 y.o.   MRN: [id number]  HPI  73 year old woman with history of COPD pulmonary nodules who was diagnosed with allergic bronchopulmonary aspergillosis plus or minus invasive pulmonary aspergillosis based on bronchoscopy and BiPAP she was initially managed by Dr. Serena Dana who placed her on posaconazole .  Allergic bronchopulmonary aspergillosis send of the most likely diagnosis based on elevated eosinophils as well as an elevated IgE to Aspergillus.  With treatment in a right cavitary lesion in the lower lobe that improved on its imaging on CAT scan in August 2024.  She then had been changed in the interim from posaconazole  to itraconazole  but then did not take this consistently between July 2024 and January 2025 in the interim she had a CT scan in December 2024 and [facility] healthcare system that showed worsening of the right lower lobe cavitary lesion as well as scattered new nodules throughout the right concern for worsening pathology.  He then resumed itraconazole .  She was hospitalized in February 2025 nodular sort areas were worse on imaging compared to most recent CT scan.  She was scheduled to see us  infectious ease in February but did not come to the appointment.    Past Medical History:  Diagnosis Date   Aortic atherosclerosis (HCC)    Cavitary lung disease    Chronic pain    COPD (chronic obstructive pulmonary disease) (HCC)    Coronary artery calcification seen on CT scan    DDD (degenerative disc disease)    DVT femoral (deep venous thrombosis) with thrombophlebitis (HCC)    Dyspnea    Pt states she has had for months   Hypertension     Past Surgical History:  Procedure Laterality Date   BRONCHIAL BIOPSY  06/22/2022   Procedure: BRONCHIAL  BIOPSIES;  Surgeon: Gloriajean Large, MD;  Location: [facility] ENDOSCOPY;  Service: Pulmonary;;   BRONCHIAL NEEDLE ASPIRATION BIOPSY  06/22/2022   Procedure: BRONCHIAL NEEDLE ASPIRATION BIOPSIES;  Surgeon: Gloriajean Large, MD;  Location: [facility] ENDOSCOPY;  Service: Pulmonary;;   BRONCHIAL WASHINGS  06/22/2022   Procedure: BRONCHIAL WASHINGS;  Surgeon: Gloriajean Large, MD;  Location: [facility] ENDOSCOPY;  Service: Pulmonary;;   TOTAL HIP ARTHROPLASTY Right    TUBAL LIGATION  10/1971    Family History  Problem Relation Age of Onset   Deep vein thrombosis Mother    Other Mother        varicose veins   Hypertension Sister    Other Sister        varicose veins   Cancer Brother       Social History   Socioeconomic History   Marital status: Widowed    Spouse name: Not on file   Number of children: 2   Years of education: 65   Highest education level: Not on file  Occupational History   Occupation: homemaker  Tobacco Use   Smoking status: Every Day    Current packs/day: 3.00    Average packs/day: 3.0 packs/day for 40.0 years (120.0 ttl pk-yrs)    Types: Cigarettes   Smokeless tobacco: Never   Tobacco comments:    Smoking about 1 ppd.  She says she cuts back.  01/16/2023 hfb  Vaping Use   Vaping status: Never Used  Substance and Sexual Activity   Alcohol use: No  Alcohol/week: 0.0 standard drinks of alcohol   Drug use: No   Sexual activity: Not on file  Other Topics Concern   Not on file  Social History Narrative   Not on file   Social Drivers of Health   Financial Resource Strain: Not on file  Food Insecurity: Food Insecurity Present (08/20/2023)   Hunger Vital Sign    Worried About Running Out of Food in the Last Year: Sometimes true    Ran Out of Food in the Last Year: Sometimes true  Transportation Needs: Unmet Transportation Needs (08/20/2023)   PRAPARE - Administrator, Civil Service (Medical): Yes    Lack of Transportation (Non-Medical): Yes  Physical Activity:  Not on file  Stress: Not on file  Social Connections: Socially Isolated (08/20/2023)   Social Connection and Isolation Panel [NHANES]    Frequency of Communication with Friends and Family: Once a week    Frequency of Social Gatherings with Friends and Family: More than three times a week    Attends Religious Services: Never    Database administrator or Organizations: No    Attends Banker Meetings: Never    Marital Status: Widowed    No Known Allergies   Current Outpatient Medications:    albuterol  (PROVENTIL ) (2.5 MG/3ML) 0.083% nebulizer solution, Take 3 mLs (2.5 mg total) by nebulization every 6 (six) hours as needed for wheezing or shortness of breath., Disp: 75 mL, Rfl: 12   albuterol  (VENTOLIN  HFA) 108 (90 Base) MCG/ACT inhaler, Inhale 2 puffs into the lungs every 6 (six) hours as needed for wheezing or shortness of breath., Disp: 18 g, Rfl: 6   amoxicillin -clavulanate (AUGMENTIN ) 875-125 MG tablet, Take 1 tablet by mouth 2 (two) times daily. (Patient not taking: Reported on 10/16/2023), Disp: 14 tablet, Rfl: 0   diltiazem  (CARDIZEM  CD) 180 MG 24 hr capsule, Take 180 mg by mouth daily., Disp: , Rfl:    Fluticasone -Umeclidin-Vilant (TRELEGY ELLIPTA ) 200-62.5-25 MCG/ACT AEPB, Inhale 1 puff into the lungs daily., Disp: 60 each, Rfl: 11   itraconazole  (SPORANOX ) 100 MG capsule, Take 2 capsules (200 mg total) by mouth 2 (two) times daily with a meal., Disp: 120 capsule, Rfl: 2   losartan  (COZAAR ) 25 MG tablet, Take 25 mg by mouth daily., Disp: , Rfl:    oxycodone  (ROXICODONE ) 30 MG immediate release tablet, Take 15-30 mg by mouth every 4 (four) hours as needed for pain.  (Patient not taking: Reported on 10/16/2023), Disp: , Rfl:    oxyCODONE -acetaminophen  (PERCOCET) 10-325 MG tablet, Take 1 tablet by mouth 2 (two) times daily as needed., Disp: , Rfl:     Review of Systems     Objective:   Physical Exam        Assessment & Plan:

## 2023-11-26 ENCOUNTER — Other Ambulatory Visit (HOSPITAL_COMMUNITY): Payer: Self-pay

## 2023-11-26 ENCOUNTER — Other Ambulatory Visit: Payer: Self-pay

## 2023-11-26 ENCOUNTER — Telehealth: Payer: Self-pay

## 2023-11-26 ENCOUNTER — Ambulatory Visit (INDEPENDENT_AMBULATORY_CARE_PROVIDER_SITE_OTHER): Admitting: Infectious Disease

## 2023-11-26 ENCOUNTER — Telehealth: Payer: Self-pay | Admitting: Pharmacist

## 2023-11-26 ENCOUNTER — Encounter: Payer: Self-pay | Admitting: Infectious Disease

## 2023-11-26 VITALS — BP 118/68 | HR 72 | Temp 97.4°F | Ht 64.0 in | Wt 107.0 lb

## 2023-11-26 DIAGNOSIS — Z79891 Long term (current) use of opiate analgesic: Secondary | ICD-10-CM

## 2023-11-26 DIAGNOSIS — B4481 Allergic bronchopulmonary aspergillosis: Secondary | ICD-10-CM | POA: Diagnosis not present

## 2023-11-26 DIAGNOSIS — F17210 Nicotine dependence, cigarettes, uncomplicated: Secondary | ICD-10-CM

## 2023-11-26 DIAGNOSIS — Z96649 Presence of unspecified artificial hip joint: Secondary | ICD-10-CM | POA: Diagnosis not present

## 2023-11-26 DIAGNOSIS — J984 Other disorders of lung: Secondary | ICD-10-CM

## 2023-11-26 DIAGNOSIS — G8929 Other chronic pain: Secondary | ICD-10-CM

## 2023-11-26 DIAGNOSIS — F17200 Nicotine dependence, unspecified, uncomplicated: Secondary | ICD-10-CM

## 2023-11-26 DIAGNOSIS — J441 Chronic obstructive pulmonary disease with (acute) exacerbation: Secondary | ICD-10-CM

## 2023-11-26 MED ORDER — CRESEMBA 186 MG PO CAPS: ORAL_CAPSULE | ORAL | 2 refills | Status: DC

## 2023-11-26 NOTE — Telephone Encounter (Signed)
 Submitted a Prior Authorization request to [employer] for Cresemba via CoverMyMeds. Will update once we receive a response.    PA ID: [id number]

## 2023-11-26 NOTE — Telephone Encounter (Signed)
 Can we place new order for POC using prior settings and data? She may require a repeat assessment at next OV.

## 2023-11-26 NOTE — Telephone Encounter (Signed)
 Called patient to follow up on Cresemba which was approved by her insurance for $0 copay. Counseled on appropriate medication administration with loading dose of 2 capsules TID for 2 days followed by 2 capsules daily. Also reviewed that medication comes in a blister pack with a desicant and should not be removed from packaging until time of administration and should not be put in her pill box. Can take with or without food, but would recommend taking with food if stomach upset occurs. Patient expressed understanding and RX was sent to her preferred [facility] in [place]. Encouraged her to reach out if questions come up.  Georga Killings, PharmD PGY-1 Pharmacy Resident

## 2023-11-26 NOTE — Telephone Encounter (Signed)
 Received notification from [facility] regarding a prior authorization for CRESEMBA. Authorization has been APPROVED from  11/26/2023 to  07/16/2024.   Per test claim, copay for 28 days supply is $0.00  Patient can fill through [facility]: [phone]   Authorization # [id number]

## 2023-11-26 NOTE — Telephone Encounter (Signed)
 Pt did not desat during walk on 03/22/2024 and pt was not walked during 10/16/2023 appt. Appt and walk is needed.  Spoke to patient and made her aware of need for appt and walk test. She is scheduled 7/2/202. I offered sooner appt and she declined. Nothing further needed at this time.

## 2023-12-11 ENCOUNTER — Telehealth: Payer: Self-pay

## 2023-12-11 DIAGNOSIS — J984 Other disorders of lung: Secondary | ICD-10-CM

## 2023-12-11 MED ORDER — CRESEMBA 186 MG PO CAPS: ORAL_CAPSULE | ORAL | 2 refills | Status: DC

## 2023-12-11 NOTE — Telephone Encounter (Signed)
 Patient called office stating she never received Cresema rx from [facility]. Last script from 5/12 [facility] [place], [place]. Pt did voice [facility] and would like prescription sent to [facility].  New prescription sent today. Pt will call with any concerns. Julien Odor, RMA

## 2023-12-18 DIAGNOSIS — M545 Low back pain, unspecified: Secondary | ICD-10-CM | POA: Diagnosis not present

## 2023-12-18 DIAGNOSIS — E119 Type 2 diabetes mellitus without complications: Secondary | ICD-10-CM | POA: Diagnosis not present

## 2023-12-18 DIAGNOSIS — J449 Chronic obstructive pulmonary disease, unspecified: Secondary | ICD-10-CM | POA: Diagnosis not present

## 2023-12-18 DIAGNOSIS — G8929 Other chronic pain: Secondary | ICD-10-CM | POA: Diagnosis not present

## 2023-12-18 DIAGNOSIS — I10 Essential (primary) hypertension: Secondary | ICD-10-CM | POA: Diagnosis not present

## 2023-12-20 DIAGNOSIS — R030 Elevated blood-pressure reading, without diagnosis of hypertension: Secondary | ICD-10-CM | POA: Diagnosis not present

## 2023-12-20 DIAGNOSIS — J449 Chronic obstructive pulmonary disease, unspecified: Secondary | ICD-10-CM | POA: Diagnosis not present

## 2023-12-20 DIAGNOSIS — F17210 Nicotine dependence, cigarettes, uncomplicated: Secondary | ICD-10-CM | POA: Diagnosis not present

## 2023-12-20 DIAGNOSIS — Z681 Body mass index (BMI) 19 or less, adult: Secondary | ICD-10-CM | POA: Diagnosis not present

## 2023-12-20 DIAGNOSIS — B449 Aspergillosis, unspecified: Secondary | ICD-10-CM | POA: Diagnosis not present

## 2023-12-20 DIAGNOSIS — R7303 Prediabetes: Secondary | ICD-10-CM | POA: Diagnosis not present

## 2023-12-29 ENCOUNTER — Ambulatory Visit (HOSPITAL_COMMUNITY)
Admission: RE | Admit: 2023-12-29 | Discharge: 2023-12-29 | Disposition: A | Discharge status: Home / Self Care | Source: Ambulatory Visit | Attending: Pulmonary Disease | Admitting: Pulmonary Disease

## 2023-12-29 DIAGNOSIS — J984 Other disorders of lung: Secondary | ICD-10-CM | POA: Insufficient documentation

## 2023-12-29 DIAGNOSIS — I700 Atherosclerosis of aorta: Secondary | ICD-10-CM | POA: Diagnosis not present

## 2023-12-29 DIAGNOSIS — J432 Centrilobular emphysema: Secondary | ICD-10-CM | POA: Diagnosis not present

## 2024-01-02 DIAGNOSIS — I10 Essential (primary) hypertension: Secondary | ICD-10-CM | POA: Diagnosis not present

## 2024-01-02 DIAGNOSIS — I9589 Other hypotension: Secondary | ICD-10-CM | POA: Diagnosis not present

## 2024-01-11 DIAGNOSIS — E119 Type 2 diabetes mellitus without complications: Secondary | ICD-10-CM | POA: Diagnosis not present

## 2024-01-11 DIAGNOSIS — G8929 Other chronic pain: Secondary | ICD-10-CM | POA: Diagnosis not present

## 2024-01-11 DIAGNOSIS — J449 Chronic obstructive pulmonary disease, unspecified: Secondary | ICD-10-CM | POA: Diagnosis not present

## 2024-01-11 DIAGNOSIS — M545 Low back pain, unspecified: Secondary | ICD-10-CM | POA: Diagnosis not present

## 2024-01-11 DIAGNOSIS — I10 Essential (primary) hypertension: Secondary | ICD-10-CM | POA: Diagnosis not present

## 2024-01-11 DIAGNOSIS — Z79899 Other long term (current) drug therapy: Secondary | ICD-10-CM | POA: Diagnosis not present

## 2024-01-16 ENCOUNTER — Encounter: Payer: Self-pay | Admitting: Pulmonary Disease

## 2024-01-16 ENCOUNTER — Ambulatory Visit (INDEPENDENT_AMBULATORY_CARE_PROVIDER_SITE_OTHER): Admitting: Pulmonary Disease

## 2024-01-16 VITALS — BP 121/65 | HR 75 | Ht 64.0 in | Wt 110.0 lb

## 2024-01-16 DIAGNOSIS — F17210 Nicotine dependence, cigarettes, uncomplicated: Secondary | ICD-10-CM

## 2024-01-16 DIAGNOSIS — B440 Invasive pulmonary aspergillosis: Secondary | ICD-10-CM | POA: Diagnosis not present

## 2024-01-16 DIAGNOSIS — R918 Other nonspecific abnormal finding of lung field: Secondary | ICD-10-CM | POA: Diagnosis not present

## 2024-01-16 NOTE — Patient Instructions (Signed)
 I am glad you are doing well  CT scan some areas look better some areas at worst, largely unchanged to my eye  Most importantly you are feeling better, coughing less, moving around.  I think these are all great signs  Return to clinic in 6 months or sooner as needed with Dr. Annella

## 2024-01-16 NOTE — Progress Notes (Signed)
 Synopsis: Referred for pulmonary nodule by Trudy Vaughn FALCON, MD  Subjective:   PATIENT ID: Teresa Edwards GENDER: female DOB: Jan 20, 1951, MRN: [id number]  Chief Complaint  Patient presents with   Follow-up    73 y.o. with COPD referred for pulmonary nodules (dominant 1.6cm RML decreased from  2.34mm on 04/01/21 scan) found to have ABPA versus pulmonary aspergillosis based on bronchoscopy with biopsy.  Most recent ID note reviewed.  Overall doing better.  Not requiring oxygen .  With ambulation stayed 97% or higher.  Cough is improved.  Minimal cough now.  In fact quality of cough is also improved, was initially black given brown sputum.  Now clear when it has happened.  She is try to move more.  Repeat CT scan in interim largely unchanged, discussed below.  HPI initial visit: She smoked 50 years (~50 py smoking) active, down to 1 cigarette per day. She does have quite a bit of chest congestion. Finished course of antibiotics on Monday and that has helped quite a bit. She has no trouble swallowing, no aspiration. She has had no fever, she has had no weight loss, no night sweats drenching the sheets.   She has DOE to 50 yards. She is on trelegy. She has never done pulmonary rehab.   She has never had a bronchoscopy or TTNA.   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 She has no pets at home. No pet bird. No hot tub   Past Medical History:  Diagnosis Date   Aortic atherosclerosis (HCC)    Cavitary lung disease    Chronic pain    COPD (chronic obstructive pulmonary disease) (HCC)    Coronary artery calcification seen on CT scan    DDD (degenerative disc disease)    DVT femoral (deep venous thrombosis) with thrombophlebitis (HCC)    Dyspnea    Pt states she has had for months   Hypertension      Family History  Problem Relation  Age of Onset   Deep vein thrombosis Mother    Other Mother        varicose veins   Hypertension Sister    Other Sister        varicose veins   Cancer Brother      Past Surgical History:  Procedure Laterality Date   BRONCHIAL BIOPSY  06/22/2022   Procedure: BRONCHIAL BIOPSIES;  Surgeon: Gladis Leonor HERO, MD;  Location: [facility] ENDOSCOPY;  Service: Pulmonary;;   BRONCHIAL NEEDLE ASPIRATION BIOPSY  06/22/2022   Procedure: BRONCHIAL NEEDLE ASPIRATION BIOPSIES;  Surgeon: Gladis Leonor HERO, MD;  Location: [facility] ENDOSCOPY;  Service: Pulmonary;;   BRONCHIAL WASHINGS  06/22/2022   Procedure: BRONCHIAL WASHINGS;  Surgeon: Gladis Leonor HERO, MD;  Location: [facility] ENDOSCOPY;  Service: Pulmonary;;   TOTAL HIP ARTHROPLASTY Right    TUBAL LIGATION  10/1971    Social History   Socioeconomic History   Marital status: Widowed    Spouse name: Not on file   Number of children: 2   Years of education: 43   Highest education level: Not on file  Occupational History   Occupation: homemaker  Tobacco Use   Smoking status: Every Day    Current packs/day: 3.00    Average packs/day: 3.0 packs/day for 40.0 years (120.0 ttl pk-yrs)    Types: Cigarettes   Smokeless tobacco: Never  Tobacco comments:    Smoking about 1 pack a week  Vaping Use   Vaping status: Never Used  Substance and Sexual Activity   Alcohol use: No    Alcohol/week: 0.0 standard drinks of alcohol   Drug use: No   Sexual activity: Not on file  Other Topics Concern   Not on file  Social History Narrative   Not on file   Social Drivers of Health   Financial Resource Strain: Not on file  Food Insecurity: Food Insecurity Present (08/20/2023)   Hunger Vital Sign    Worried About Running Out of Food in the Last Year: Sometimes true    Ran Out of Food in the Last Year: Sometimes true  Transportation Needs: Unmet Transportation Needs (08/20/2023)   PRAPARE - Administrator, Civil Service (Medical): Yes    Lack of Transportation  (Non-Medical): Yes  Physical Activity: Not on file  Stress: Not on file  Social Connections: Socially Isolated (08/20/2023)   Social Connection and Isolation Panel    Frequency of Communication with Friends and Family: Once a week    Frequency of Social Gatherings with Friends and Family: More than three times a week    Attends Religious Services: Never    Database administrator or Organizations: No    Attends Banker Meetings: Never    Marital Status: Widowed  Intimate Partner Violence: Not At Risk (08/20/2023)   Humiliation, Afraid, Rape, and Kick questionnaire    Fear of Current or Ex-Partner: No    Emotionally Abused: No    Physically Abused: No    Sexually Abused: No     No Known Allergies   Outpatient Medications Prior to Visit  Medication Sig Dispense Refill   albuterol  (PROVENTIL ) (2.5 MG/3ML) 0.083% nebulizer solution Take 3 mLs (2.5 mg total) by nebulization every 6 (six) hours as needed for wheezing or shortness of breath. 75 mL 12   albuterol  (VENTOLIN  HFA) 108 (90 Base) MCG/ACT inhaler Inhale 2 puffs into the lungs every 6 (six) hours as needed for wheezing or shortness of breath. 18 g 6   amoxicillin -clavulanate (AUGMENTIN ) 875-125 MG tablet Take 1 tablet by mouth 2 (two) times daily. (Patient not taking: Reported on 01/16/2024) 14 tablet 0   diltiazem  (CARDIZEM  CD) 180 MG 24 hr capsule Take 180 mg by mouth daily.     Fluticasone -Umeclidin-Vilant (TRELEGY ELLIPTA ) 200-62.5-25 MCG/ACT AEPB Inhale 1 puff into the lungs daily. 60 each 11   Isavuconazonium Sulfate  (CRESEMBA ) 186 MG CAPS Take 2 capsules by mouth three times a day for 2 days then reduce dose to 2 capsules by mouth once daily. 56 capsule 2   losartan  (COZAAR ) 25 MG tablet Take 25 mg by mouth daily.     oxycodone  (ROXICODONE ) 30 MG immediate release tablet Take 15-30 mg by mouth every 4 (four) hours as needed for pain.  (Patient not taking: Reported on 01/16/2024)     oxyCODONE -acetaminophen  (PERCOCET)  10-325 MG tablet Take 1 tablet by mouth 2 (two) times daily as needed.     No facility-administered medications prior to visit.       Objective:   Physical Exam:  General appearance: 73 y.o., female, NAD, conversant, chroincally ill appearing Eyes: anicteric sclerae, moist conjunctivae HENT: NCAT Neck: Trachea midline; no JVD Lungs: diminished b/l, no crackles, no wheeze, with normal respiratory effort CV: RRR, no MRGs  Abdomen: Soft, non-tender; non-distended Extremities: No peripheral edem Neuro: Alert and oriented to person and place, no focal  deficit    Vitals:   01/16/24 1357  BP: 121/65  Pulse: 75  SpO2: 97%  Weight: 110 lb (49.9 kg)  Height: 5' 4 (1.626 m)    97% on RA BMI Readings from Last 3 Encounters:  01/16/24 18.88 kg/m  11/26/23 18.37 kg/m  10/16/23 18.50 kg/m   Wt Readings from Last 3 Encounters:  01/16/24 110 lb (49.9 kg)  11/26/23 107 lb (48.5 kg)  10/16/23 107 lb 12.8 oz (48.9 kg)     CBC    Component Value Date/Time   WBC 7.2 08/21/2023 0403   RBC 3.52 (L) 08/21/2023 0403   RBC 3.47 (L) 08/21/2023 0403   HGB 10.5 (L) 08/21/2023 0403   HCT 34.8 (L) 08/21/2023 0403   PLT 239 08/21/2023 0403   MCV 98.9 08/21/2023 0403   MCH 29.8 08/21/2023 0403   MCHC 30.2 08/21/2023 0403   RDW 14.8 08/21/2023 0403   LYMPHSABS 1.3 08/19/2023 2008   MONOABS 0.6 08/19/2023 2008   EOSABS 0.3 08/19/2023 2008   BASOSABS 0.0 08/19/2023 2008   Quant gold negative, ANCAs neg, AFB sputum 9/22 neg  Chest Imaging:  CTA 10/2016 reviewed by me and remarkable for significant TIB nodularity esp RML, RLL, smaller RLLL cavitary lesion  CT Chest 04/01/21 reviewed by me and remarkable for  Interval decrease in size of RML nodule relative to CTA Chest 01/2021  CTA Chest 03/02/22 reviewed by me with multiple stable cavitary nodules  CT Chest 04/19/22 reviewed by me - RUL apical lesion does look a bit larger/more solid than prior but agree otherwise remarkable for  waxing/waning pulm nodules  CT Chest 09/22/22 with decreased size of RLL consolidation/cavitary lesion, other nodules stable  CT chest 02/2023 with decreased size of right lower lobe consolidation/cavitary lesion, overall improvement throughout  CT chest outside hospital 12/24 worsening right lower lobe cavitary lesion and scattered new nodules throughout concerning for inflammatory infiltrate on my review interpretation given rapid development and multiple nodules  CT chest 12/2023 waxing and waning nodules largely unchanged, radiology thought mildly progressed over time  Pulmonary Functions Testing Results:     No data to display              Assessment & Plan:   # Pulmonary nodules # Chronic cavitary pulmonary aspergillosis s/p nav bronch 06/2022 initially improved on azole therapy subsequent worsening 06/2023 while off antifungals She may simply have chronic pulmonary aspergillosis but elevated Eos, IgE greater than 2000 raises had a concern for ABPA.  Initial improvement on nasal therapy with worsening and multiple new nodules while off therapy.  Most likely worsening disease.  No worsening pulmonary symptoms cough or shortness of breath which is reassuring.  In fact cough is improving.  Hypoxemia resolved.  -CT Chest at end of 6 month azole course (August 2024) demonstrated improvement -Switch to itraconazole  01/2023 with subsequent nonadherence from then until about January 2025 -Recent switch to posaconazole  11/2023 to aid in medication interactions -CT chest 12/2023 with waxing and waning nodules largely unchanged, plan to repeat in 6 to 12 months -continue trelegy 200   Return to clinic in 6 months or sooner if needed with Dr. Annella Donnice JONELLE Annella, MD [facility] 01/16/2024 2:14 PM

## 2024-01-21 DIAGNOSIS — Z681 Body mass index (BMI) 19 or less, adult: Secondary | ICD-10-CM | POA: Diagnosis not present

## 2024-01-21 DIAGNOSIS — R7303 Prediabetes: Secondary | ICD-10-CM | POA: Diagnosis not present

## 2024-01-21 DIAGNOSIS — J449 Chronic obstructive pulmonary disease, unspecified: Secondary | ICD-10-CM | POA: Diagnosis not present

## 2024-01-21 DIAGNOSIS — R030 Elevated blood-pressure reading, without diagnosis of hypertension: Secondary | ICD-10-CM | POA: Diagnosis not present

## 2024-01-21 DIAGNOSIS — B449 Aspergillosis, unspecified: Secondary | ICD-10-CM | POA: Diagnosis not present

## 2024-02-13 NOTE — Progress Notes (Unsigned)
 Subjective:  Complaint: Follow-up for allergic bronchopulmonary aspergillosis plus minus invasive aspergillosis   Patient ID: Teresa Edwards, female    DOB: Dec 12, 1950, 73 y.o.   MRN: [id number]  HPI  Discussed the use of AI scribe software for clinical note transcription with the patient, who gave verbal consent to proceed.  History of Present Illness   Teresa Edwards is a 73 year old female with COPD and pulmonary nodules who presents for follow-up of allergic bronchopulmonary aspergillosis vs invasive aspergillosis. She was referred by Dr. Delayne for follow-up.  Initially managed with posaconazole , she experienced improvement in her allergic bronchopulmonary aspergillosis. A delay in switching to itraconazole  due to prior authorization issues led to worsening symptoms, including cough and brown phlegm, and a CT scan in December 2024 showed a right lower cavitary lesion. Restarting itraconazole  100 mg twice daily improved her symptoms, with further improvement after increasing the dose to 200 mg twice daily. Her cough and phlegm have improved, with sputum color changing from brown to white.  She was not on prednisone  during the last visit. She was taking Percocet, which interacts with itraconazole , potentially contributing to sleepiness. We have subsequently changed her to cresemba  and she has done very well on this. She reports no current cough and improvement in symptoms since resuming antifungal treatment.  She has a long history of smoking since 1972 and is currently working on reducing her smoking, having not smoked in five days.      Past Medical History:  Diagnosis Date   Aortic atherosclerosis (HCC)    Cavitary lung disease    Chronic pain    COPD (chronic obstructive pulmonary disease) (HCC)    Coronary artery calcification seen on CT scan    DDD (degenerative disc disease)    DVT femoral (deep venous thrombosis) with thrombophlebitis (HCC)    Dyspnea    Pt states  she has had for months   Hypertension     Past Surgical History:  Procedure Laterality Date   BRONCHIAL BIOPSY  06/22/2022   Procedure: BRONCHIAL BIOPSIES;  Surgeon: Gladis Leonor HERO, MD;  Location: [facility] ENDOSCOPY;  Service: Pulmonary;;   BRONCHIAL NEEDLE ASPIRATION BIOPSY  06/22/2022   Procedure: BRONCHIAL NEEDLE ASPIRATION BIOPSIES;  Surgeon: Gladis Leonor HERO, MD;  Location: [facility] ENDOSCOPY;  Service: Pulmonary;;   BRONCHIAL WASHINGS  06/22/2022   Procedure: BRONCHIAL WASHINGS;  Surgeon: Gladis Leonor HERO, MD;  Location: [facility] ENDOSCOPY;  Service: Pulmonary;;   TOTAL HIP ARTHROPLASTY Right    TUBAL LIGATION  10/1971    Family History  Problem Relation Age of Onset   Deep vein thrombosis Mother    Other Mother        varicose veins   Hypertension Sister    Other Sister        varicose veins   Cancer Brother       Social History   Socioeconomic History   Marital status: Widowed    Spouse name: Not on file   Number of children: 2   Years of education: 4   Highest education level: Not on file  Occupational History   Occupation: homemaker  Tobacco Use   Smoking status: Every Day    Current packs/day: 3.00    Average packs/day: 3.0 packs/day for 40.0 years (120.0 ttl pk-yrs)    Types: Cigarettes   Smokeless tobacco: Never   Tobacco comments:    Smoking about 1 pack a week  Vaping Use   Vaping status: Never Used  Substance and  Sexual Activity   Alcohol use: No    Alcohol/week: 0.0 standard drinks of alcohol   Drug use: No   Sexual activity: Not on file  Other Topics Concern   Not on file  Social History Narrative   Not on file   Social Drivers of Health   Financial Resource Strain: Not on file  Food Insecurity: Food Insecurity Present (08/20/2023)   Hunger Vital Sign    Worried About Running Out of Food in the Last Year: Sometimes true    Ran Out of Food in the Last Year: Sometimes true  Transportation Needs: Unmet Transportation Needs (08/20/2023)   PRAPARE -  Administrator, Civil Service (Medical): Yes    Lack of Transportation (Non-Medical): Yes  Physical Activity: Not on file  Stress: Not on file  Social Connections: Socially Isolated (08/20/2023)   Social Connection and Isolation Panel    Frequency of Communication with Friends and Family: Once a week    Frequency of Social Gatherings with Friends and Family: More than three times a week    Attends Religious Services: Never    Database administrator or Organizations: No    Attends Banker Meetings: Never    Marital Status: Widowed    No Known Allergies   Current Outpatient Medications:    albuterol  (PROVENTIL ) (2.5 MG/3ML) 0.083% nebulizer solution, Take 3 mLs (2.5 mg total) by nebulization every 6 (six) hours as needed for wheezing or shortness of breath., Disp: 75 mL, Rfl: 12   albuterol  (VENTOLIN  HFA) 108 (90 Base) MCG/ACT inhaler, Inhale 2 puffs into the lungs every 6 (six) hours as needed for wheezing or shortness of breath., Disp: 18 g, Rfl: 6   amoxicillin -clavulanate (AUGMENTIN ) 875-125 MG tablet, Take 1 tablet by mouth 2 (two) times daily. (Patient not taking: Reported on 01/16/2024), Disp: 14 tablet, Rfl: 0   diltiazem  (CARDIZEM  CD) 180 MG 24 hr capsule, Take 180 mg by mouth daily., Disp: , Rfl:    Fluticasone -Umeclidin-Vilant (TRELEGY ELLIPTA ) 200-62.5-25 MCG/ACT AEPB, Inhale 1 puff into the lungs daily., Disp: 60 each, Rfl: 11   Isavuconazonium Sulfate  (CRESEMBA ) 186 MG CAPS, Take 2 capsules by mouth three times a day for 2 days then reduce dose to 2 capsules by mouth once daily., Disp: 56 capsule, Rfl: 2   losartan  (COZAAR ) 25 MG tablet, Take 25 mg by mouth daily., Disp: , Rfl:    oxycodone  (ROXICODONE ) 30 MG immediate release tablet, Take 15-30 mg by mouth every 4 (four) hours as needed for pain.  (Patient not taking: Reported on 01/16/2024), Disp: , Rfl:    oxyCODONE -acetaminophen  (PERCOCET) 10-325 MG tablet, Take 1 tablet by mouth 2 (two) times daily as  needed., Disp: , Rfl:     Review of Systems  Constitutional:  Negative for activity change, appetite change, chills, diaphoresis, fatigue, fever and unexpected weight change.  HENT:  Negative for congestion, rhinorrhea, sinus pressure, sneezing, sore throat and trouble swallowing.   Eyes:  Negative for photophobia and visual disturbance.  Respiratory:  Negative for cough, chest tightness, shortness of breath, wheezing and stridor.   Cardiovascular:  Negative for chest pain, palpitations and leg swelling.  Gastrointestinal:  Negative for abdominal distention, abdominal pain, anal bleeding, blood in stool, constipation, diarrhea, nausea and vomiting.  Genitourinary:  Negative for difficulty urinating, dysuria, flank pain and hematuria.  Musculoskeletal:  Negative for arthralgias, back pain, gait problem, joint swelling and myalgias.  Skin:  Negative for color change, pallor, rash and wound.  Neurological:  Negative for dizziness, tremors, weakness and light-headedness.  Hematological:  Negative for adenopathy. Does not bruise/bleed easily.  Psychiatric/Behavioral:  Negative for agitation, behavioral problems, confusion, decreased concentration, dysphoric mood and sleep disturbance.        Objective:   Physical Exam Constitutional:      General: She is not in acute distress.    Appearance: Normal appearance. She is well-developed. She is not ill-appearing or diaphoretic.  HENT:     Head: Normocephalic and atraumatic.     Right Ear: Hearing and external ear normal.     Left Ear: Hearing and external ear normal.     Nose: No nasal deformity or rhinorrhea.  Eyes:     General: No scleral icterus.    Conjunctiva/sclera: Conjunctivae normal.     Right eye: Right conjunctiva is not injected.     Left eye: Left conjunctiva is not injected.     Pupils: Pupils are equal, round, and reactive to light.  Neck:     Vascular: No JVD.  Cardiovascular:     Rate and Rhythm: Normal rate and regular  rhythm.     Heart sounds: Normal heart sounds, S1 normal and S2 normal. No murmur heard.    No friction rub. No gallop.  Pulmonary:     Effort: Prolonged expiration present.     Breath sounds: No stridor. No wheezing, rhonchi or rales.  Abdominal:     General: There is no distension.     Palpations: Abdomen is soft.  Musculoskeletal:        General: Normal range of motion.     Right shoulder: Normal.     Left shoulder: Normal.     Cervical back: Normal range of motion and neck supple.     Right hip: Normal.     Left hip: Normal.     Right knee: Normal.     Left knee: Normal.  Lymphadenopathy:     Head:     Right side of head: No submandibular, preauricular or posterior auricular adenopathy.     Left side of head: No submandibular, preauricular or posterior auricular adenopathy.     Cervical: No cervical adenopathy.     Right cervical: No superficial or deep cervical adenopathy.    Left cervical: No superficial or deep cervical adenopathy.  Skin:    General: Skin is warm and dry.     Coloration: Skin is not pale.     Findings: No abrasion, bruising, ecchymosis, erythema, lesion or rash.     Nails: There is no clubbing.  Neurological:     Mental Status: She is alert and oriented to person, place, and time.     Sensory: No sensory deficit.     Coordination: Coordination normal.     Gait: Gait normal.  Psychiatric:        Attention and Perception: She is attentive.        Mood and Affect: Mood normal.        Speech: Speech normal.        Behavior: Behavior normal. Behavior is cooperative.        Thought Content: Thought content normal.        Judgment: Judgment normal.           Assessment & Plan:   Assessment and Plan    Pulmonary aspergillosis (allergic and/or invasive) in the setting of COPD and pulmonary nodules Allergic bronchopulmonary aspergillosis with possible invasive disease. Managed with Cresemba  (isavuconazonium sulfate ) since May 2025. Symptoms  improved. CT scan shows waxing and waning areas with some improvement.  - Continue Cresemba  (isavuconazonium sulfate ) therapy. --LFTs with PCP in August - Monitor symptoms and follow up with pulmonary specialist, Dr. Delayne, in six months and with us  as well   Chronic obstructive pulmonary disease (COPD) COPD with history of smoking. Symptoms improved. - Encourage smoking cessation to improve lung health. - Continue monitoring respiratory symptoms and lung function.  Tobacco use disorder Long history of smoking since 1972. Currently reducing smoking with recent abstinence for five days. Discussed challenges and benefits of vaping as harm reduction. - Encourage continued efforts to quit smoking. - Consider vaping as a harm reduction strategy if unable to quit smoking completely. - Support and encourage multiple attempts to quit smoking.

## 2024-02-14 ENCOUNTER — Encounter: Payer: Self-pay | Admitting: Infectious Disease

## 2024-02-14 ENCOUNTER — Other Ambulatory Visit: Payer: Self-pay

## 2024-02-14 ENCOUNTER — Ambulatory Visit (INDEPENDENT_AMBULATORY_CARE_PROVIDER_SITE_OTHER): Payer: Self-pay | Admitting: Infectious Disease

## 2024-02-14 VITALS — BP 172/93 | HR 70 | Temp 97.4°F | Ht 64.0 in | Wt 108.0 lb

## 2024-02-14 DIAGNOSIS — F17200 Nicotine dependence, unspecified, uncomplicated: Secondary | ICD-10-CM | POA: Diagnosis not present

## 2024-02-14 DIAGNOSIS — B4481 Allergic bronchopulmonary aspergillosis: Secondary | ICD-10-CM | POA: Diagnosis not present

## 2024-02-14 DIAGNOSIS — B440 Invasive pulmonary aspergillosis: Secondary | ICD-10-CM

## 2024-02-14 DIAGNOSIS — J984 Other disorders of lung: Secondary | ICD-10-CM | POA: Diagnosis not present

## 2024-02-14 DIAGNOSIS — J441 Chronic obstructive pulmonary disease with (acute) exacerbation: Secondary | ICD-10-CM

## 2024-02-14 MED ORDER — CRESEMBA 186 MG PO CAPS: ORAL_CAPSULE | ORAL | 8 refills | Status: AC

## 2024-02-15 DIAGNOSIS — E119 Type 2 diabetes mellitus without complications: Secondary | ICD-10-CM | POA: Diagnosis not present

## 2024-02-15 DIAGNOSIS — M545 Low back pain, unspecified: Secondary | ICD-10-CM | POA: Diagnosis not present

## 2024-02-15 DIAGNOSIS — J449 Chronic obstructive pulmonary disease, unspecified: Secondary | ICD-10-CM | POA: Diagnosis not present

## 2024-02-15 DIAGNOSIS — I10 Essential (primary) hypertension: Secondary | ICD-10-CM | POA: Diagnosis not present

## 2024-02-15 DIAGNOSIS — G8929 Other chronic pain: Secondary | ICD-10-CM | POA: Diagnosis not present

## 2024-02-28 DIAGNOSIS — E119 Type 2 diabetes mellitus without complications: Secondary | ICD-10-CM | POA: Diagnosis not present

## 2024-02-28 DIAGNOSIS — Z130 Encounter for screening for diseases of the blood and blood-forming organs and certain disorders involving the immune mechanism: Secondary | ICD-10-CM | POA: Diagnosis not present

## 2024-03-05 DIAGNOSIS — R109 Unspecified abdominal pain: Secondary | ICD-10-CM | POA: Diagnosis not present

## 2024-03-05 DIAGNOSIS — B449 Aspergillosis, unspecified: Secondary | ICD-10-CM | POA: Diagnosis not present

## 2024-03-05 DIAGNOSIS — J449 Chronic obstructive pulmonary disease, unspecified: Secondary | ICD-10-CM | POA: Diagnosis not present

## 2024-03-05 DIAGNOSIS — Z681 Body mass index (BMI) 19 or less, adult: Secondary | ICD-10-CM | POA: Diagnosis not present

## 2024-03-05 DIAGNOSIS — R7303 Prediabetes: Secondary | ICD-10-CM | POA: Diagnosis not present

## 2024-03-05 DIAGNOSIS — R030 Elevated blood-pressure reading, without diagnosis of hypertension: Secondary | ICD-10-CM | POA: Diagnosis not present

## 2024-03-19 DIAGNOSIS — R030 Elevated blood-pressure reading, without diagnosis of hypertension: Secondary | ICD-10-CM | POA: Diagnosis not present

## 2024-03-19 DIAGNOSIS — J449 Chronic obstructive pulmonary disease, unspecified: Secondary | ICD-10-CM | POA: Diagnosis not present

## 2024-03-19 DIAGNOSIS — F17210 Nicotine dependence, cigarettes, uncomplicated: Secondary | ICD-10-CM | POA: Diagnosis not present

## 2024-03-19 DIAGNOSIS — B449 Aspergillosis, unspecified: Secondary | ICD-10-CM | POA: Diagnosis not present

## 2024-03-19 DIAGNOSIS — Z681 Body mass index (BMI) 19 or less, adult: Secondary | ICD-10-CM | POA: Diagnosis not present

## 2024-03-20 DIAGNOSIS — M545 Low back pain, unspecified: Secondary | ICD-10-CM | POA: Diagnosis not present

## 2024-03-20 DIAGNOSIS — I10 Essential (primary) hypertension: Secondary | ICD-10-CM | POA: Diagnosis not present

## 2024-03-20 DIAGNOSIS — R030 Elevated blood-pressure reading, without diagnosis of hypertension: Secondary | ICD-10-CM | POA: Diagnosis not present

## 2024-03-20 DIAGNOSIS — G8929 Other chronic pain: Secondary | ICD-10-CM | POA: Diagnosis not present

## 2024-03-20 DIAGNOSIS — E1142 Type 2 diabetes mellitus with diabetic polyneuropathy: Secondary | ICD-10-CM | POA: Diagnosis not present

## 2024-04-14 DIAGNOSIS — R1013 Epigastric pain: Secondary | ICD-10-CM | POA: Diagnosis not present

## 2024-04-14 DIAGNOSIS — Z681 Body mass index (BMI) 19 or less, adult: Secondary | ICD-10-CM | POA: Diagnosis not present

## 2024-04-16 DIAGNOSIS — I10 Essential (primary) hypertension: Secondary | ICD-10-CM | POA: Diagnosis not present

## 2024-04-16 DIAGNOSIS — M545 Low back pain, unspecified: Secondary | ICD-10-CM | POA: Diagnosis not present

## 2024-04-16 DIAGNOSIS — G8929 Other chronic pain: Secondary | ICD-10-CM | POA: Diagnosis not present

## 2024-04-16 DIAGNOSIS — Z79899 Other long term (current) drug therapy: Secondary | ICD-10-CM | POA: Diagnosis not present

## 2024-04-21 ENCOUNTER — Ambulatory Visit: Admitting: Pulmonary Disease

## 2024-04-22 DIAGNOSIS — R1013 Epigastric pain: Secondary | ICD-10-CM | POA: Diagnosis not present

## 2024-05-08 ENCOUNTER — Encounter: Payer: Self-pay | Admitting: Gastroenterology

## 2024-05-20 ENCOUNTER — Emergency Department (HOSPITAL_COMMUNITY)

## 2024-05-20 ENCOUNTER — Emergency Department (HOSPITAL_COMMUNITY)
Admission: EM | Admit: 2024-05-20 | Discharge: 2024-05-21 | Disposition: A | Discharge status: Home / Self Care | Attending: Emergency Medicine | Admitting: Emergency Medicine

## 2024-05-20 ENCOUNTER — Encounter (HOSPITAL_COMMUNITY): Payer: Self-pay | Admitting: *Deleted

## 2024-05-20 ENCOUNTER — Other Ambulatory Visit: Payer: Self-pay

## 2024-05-20 DIAGNOSIS — J449 Chronic obstructive pulmonary disease, unspecified: Secondary | ICD-10-CM | POA: Diagnosis not present

## 2024-05-20 DIAGNOSIS — Z79899 Other long term (current) drug therapy: Secondary | ICD-10-CM | POA: Insufficient documentation

## 2024-05-20 DIAGNOSIS — R109 Unspecified abdominal pain: Secondary | ICD-10-CM | POA: Diagnosis present

## 2024-05-20 DIAGNOSIS — I10 Essential (primary) hypertension: Secondary | ICD-10-CM | POA: Insufficient documentation

## 2024-05-20 DIAGNOSIS — K2970 Gastritis, unspecified, without bleeding: Secondary | ICD-10-CM | POA: Diagnosis not present

## 2024-05-20 LAB — URINALYSIS, ROUTINE W REFLEX MICROSCOPIC
Bilirubin Urine: NEGATIVE
Glucose, UA: NEGATIVE mg/dL
Hgb urine dipstick: NEGATIVE
Ketones, ur: NEGATIVE mg/dL
Leukocytes,Ua: NEGATIVE
Nitrite: NEGATIVE
Protein, ur: 100 mg/dL — AB
Specific Gravity, Urine: 1.031 — ABNORMAL HIGH (ref 1.005–1.030)
pH: 5 (ref 5.0–8.0)

## 2024-05-20 LAB — COMPREHENSIVE METABOLIC PANEL WITH GFR
ALT: <5 U/L (ref 0–44)
AST: 15 U/L (ref 15–41)
Albumin: 3.4 g/dL — ABNORMAL LOW (ref 3.5–5.0)
Alkaline Phosphatase: 102 U/L (ref 38–126)
Anion gap: 10 (ref 5–15)
BUN: 16 mg/dL (ref 8–23)
CO2: 29 mmol/L (ref 22–32)
Calcium: 8.8 mg/dL — ABNORMAL LOW (ref 8.9–10.3)
Chloride: 103 mmol/L (ref 98–111)
Creatinine, Ser: 0.57 mg/dL (ref 0.44–1.00)
GFR, Estimated: >60 mL/min (ref >60)
Glucose, Bld: 88 mg/dL (ref 70–99)
Potassium: 3.7 mmol/L (ref 3.5–5.1)
Sodium: 142 mmol/L (ref 135–145)
Total Bilirubin: <0.2 mg/dL (ref 0.0–1.2)
Total Protein: 7.2 g/dL (ref 6.5–8.1)

## 2024-05-20 LAB — CBC
HCT: 36.3 % (ref 36.0–46.0)
Hemoglobin: 11.3 g/dL — ABNORMAL LOW (ref 12.0–15.0)
MCH: 30.2 pg (ref 26.0–34.0)
MCHC: 31.1 g/dL (ref 30.0–36.0)
MCV: 97.1 fL (ref 80.0–100.0)
Platelets: 464 K/uL — ABNORMAL HIGH (ref 150–400)
RBC: 3.74 MIL/uL — ABNORMAL LOW (ref 3.87–5.11)
RDW: 14.6 % (ref 11.5–15.5)
WBC: 9.8 K/uL (ref 4.0–10.5)
nRBC: 0 % (ref 0.0–0.2)

## 2024-05-20 LAB — LIPASE, BLOOD: Lipase: 19 U/L (ref 11–51)

## 2024-05-20 MED ORDER — PANTOPRAZOLE SODIUM 40 MG IV SOLR
40.0000 mg | Freq: Two times a day (BID) | INTRAVENOUS | Status: DC
  Administered 2024-05-20: 40 mg via INTRAVENOUS
  Filled 2024-05-20: qty 10

## 2024-05-20 MED ORDER — ONDANSETRON HCL 4 MG/2ML IJ SOLN
4.0000 mg | Freq: Four times a day (QID) | INTRAMUSCULAR | Status: DC | PRN
  Administered 2024-05-20: 4 mg via INTRAVENOUS

## 2024-05-20 MED ORDER — ONDANSETRON HCL 4 MG/2ML IJ SOLN
INTRAMUSCULAR | Status: AC
  Filled 2024-05-20: qty 2

## 2024-05-20 MED ORDER — HYDROMORPHONE HCL 1 MG/ML IJ SOLN
INTRAMUSCULAR | Status: AC
  Filled 2024-05-20: qty 0.5

## 2024-05-20 MED ORDER — LACTATED RINGERS IV BOLUS
500.0000 mL | Freq: Once | INTRAVENOUS | Status: AC
  Administered 2024-05-20: 500 mL via INTRAVENOUS

## 2024-05-20 MED ORDER — IOHEXOL 300 MG/ML  SOLN
100.0000 mL | Freq: Once | INTRAMUSCULAR | Status: AC | PRN
  Administered 2024-05-20: 100 mL via INTRAVENOUS

## 2024-05-20 MED ORDER — HYDROMORPHONE HCL 1 MG/ML IJ SOLN
0.5000 mg | Freq: Once | INTRAMUSCULAR | Status: AC
  Administered 2024-05-20: 0.5 mg via INTRAVENOUS

## 2024-05-20 NOTE — ED Provider Notes (Signed)
 [facility] EMERGENCY DEPARTMENT AT [facility] Provider Note   CSN: [id number] Arrival date & time: 05/20/24  1655     Patient presents with: Abdominal Pain   Teresa Edwards is a 73 y.o. female.  {Add pertinent medical, surgical, social history, OB history to YEP:67052}  Abdominal Pain Patient presents for abdominal pain.  Medical history includes COPD, pulmonary aspergillosis, DVT, HTN, anxiety, chronic pain.  For the past month, she has had intermittent abdominal pain, nausea, vomiting.  She describes area pain as periumbilical.  It does radiate across both sides of her abdomen.  Per chart review, she had 1 recent episode of vaginal bleeding.  She underwent a pelvic ultrasound which showed 4 mm endometrial lining.  They did not feel that there was a need for endometrial biopsy at this time.  Patient states that her ongoing pain has worsened over the past several days.  She describes current severity of 10/10.  She denies any current nausea.  She lives at home with her grandson.     Prior to Admission medications   Medication Sig Start Date End Date Taking? Authorizing Provider  albuterol  (PROVENTIL ) (2.5 MG/3ML) 0.083% nebulizer solution Take 3 mLs (2.5 mg total) by nebulization every 6 (six) hours as needed for wheezing or shortness of breath. 01/16/23   Parrett, Madelin GORMAN, NP  albuterol  (VENTOLIN  HFA) 108 (90 Base) MCG/ACT inhaler Inhale 2 puffs into the lungs every 6 (six) hours as needed for wheezing or shortness of breath. 01/16/23   Parrett, Madelin GORMAN, NP  amoxicillin -clavulanate (AUGMENTIN ) 875-125 MG tablet Take 1 tablet by mouth 2 (two) times daily. Patient not taking: Reported on 02/14/2024 09/13/23   Hunsucker, Donnice SAUNDERS, MD  diltiazem  (CARDIZEM  CD) 180 MG 24 hr capsule Take 180 mg by mouth daily.    [provider]  Fluticasone -Umeclidin-Vilant (TRELEGY ELLIPTA ) 200-62.5-25 MCG/ACT AEPB Inhale 1 puff into the lungs daily. 10/16/23   Hunsucker, Donnice SAUNDERS, MD   Isavuconazonium Sulfate  (CRESEMBA ) 186 MG CAPS 2 capsules by mouth once daily. 02/14/24   Fleeta Kathie Jomarie LOISE, MD  losartan  (COZAAR ) 25 MG tablet Take 25 mg by mouth daily. 02/24/22   [provider]  oxycodone  (ROXICODONE ) 30 MG immediate release tablet Take 15-30 mg by mouth every 4 (four) hours as needed for pain.  Patient not taking: Reported on 02/14/2024    [provider]  oxyCODONE -acetaminophen  (PERCOCET) 10-325 MG tablet Take 1 tablet by mouth 2 (two) times daily as needed. 10/15/23   [provider]    Allergies: Patient has no known allergies.    Review of Systems  Gastrointestinal:  Positive for abdominal pain.  All other systems reviewed and are negative.   Updated Vital Signs BP (!) 176/97   Pulse (!) 102   Temp (!) 97.4 F (36.3 C) (Temporal)   Resp 18   Ht 5' 4 (1.626 m)   Wt 48.5 kg   SpO2 97%   BMI 18.37 kg/m   Physical Exam Vitals and nursing note reviewed.  Constitutional:      General: She is not in acute distress.    Appearance: She is well-developed. She is not ill-appearing, toxic-appearing or diaphoretic.  HENT:     Head: Normocephalic and atraumatic.  Eyes:     Conjunctiva/sclera: Conjunctivae normal.  Cardiovascular:     Rate and Rhythm: Normal rate and regular rhythm.  Pulmonary:     Effort: Pulmonary effort is normal. No respiratory distress.  Abdominal:     General: Abdomen  is flat.     Palpations: Abdomen is soft.     Tenderness: There is generalized abdominal tenderness. There is no guarding or rebound.  Musculoskeletal:        General: No swelling.     Cervical back: Neck supple.  Skin:    General: Skin is warm and dry.  Neurological:     General: No focal deficit present.     Mental Status: She is alert and oriented to person, place, and time.  Psychiatric:        Mood and Affect: Mood normal.        Behavior: Behavior normal.     (all labs ordered are listed, but only abnormal results are  displayed) Labs Reviewed  COMPREHENSIVE METABOLIC PANEL WITH GFR - Abnormal; Notable for the following components:      Result Value   Calcium 8.8 (*)    Albumin 3.4 (*)    All other components within normal limits  CBC - Abnormal; Notable for the following components:   RBC 3.74 (*)    Hemoglobin 11.3 (*)    Platelets 464 (*)    All other components within normal limits  LIPASE, BLOOD  URINALYSIS, ROUTINE W REFLEX MICROSCOPIC    EKG: None  Radiology: No results found.  {Document cardiac monitor, telemetry assessment procedure when appropriate:32947} Procedures   Medications Ordered in the ED - No data to display    {Click here for ABCD2, HEART and other calculators REFRESH Note before signing:1}                              Medical Decision Making Amount and/or Complexity of Data Reviewed Labs: ordered.   This patient presents to the ED for concern of ***, this involves an extensive number of treatment options, and is a complaint that carries with it a high risk of complications and morbidity.  The differential diagnosis includes ***   Co morbidities / Chronic conditions that complicate the patient evaluation  ***   Additional history obtained:  Additional history obtained from EMR External records from outside source obtained and reviewed including ***   Lab Tests:  I Ordered, and personally interpreted labs.  The pertinent results include:  ***   Imaging Studies ordered:  I ordered imaging studies including ***  I independently visualized and interpreted imaging which showed *** I agree with the radiologist interpretation   Cardiac Monitoring: / EKG:  The patient was maintained on a cardiac monitor.  I personally viewed and interpreted the cardiac monitored which showed an underlying rhythm of: ***   Problem List / ED Course / Critical interventions / Medication management  Patient presenting for abdominal pain, reportedly intermittent over the  past month but worsened over the past several days.  On arrival in the ED, vital signs are notable for hypertension.  Patient is well-appearing on exam.  She does have a generalized abdominal tenderness without guarding.  Dilaudid was ordered for analgesia.  Workup was initiated.*** I ordered medication including ***   Reevaluation of the patient after these medicines showed that the patient *** I have reviewed the patients home medicines and have made adjustments as needed   Consultations Obtained:  I requested consultation with the ***,  and discussed lab and imaging findings as well as pertinent plan - they recommend: ***   Social Determinants of Health:  ***   Test / Admission - Considered:  ***   {Document  critical care time when appropriate  Document review of labs and clinical decision tools ie CHADS2VASC2, etc  Document your independent review of radiology images and any outside records  Document your discussion with family members, caretakers and with consultants  Document social determinants of health affecting pt's care  Document your decision making why or why not admission, treatments were needed:32947:::1}   Final diagnoses:  None    ED Discharge Orders     None

## 2024-05-20 NOTE — ED Notes (Signed)
 Taken to ct

## 2024-05-20 NOTE — ED Triage Notes (Signed)
 Pt states she had an US  done at [facility], was instructed if her abd pain gets worse to come to ED. Abd pain x 1 month.  N/V at times per pt.

## 2024-05-20 NOTE — ED Notes (Addendum)
 See triage notes. Pt states eat/drinks fine. No ss of dehydration noted. A/o. Color wnl. Pt c/o some pain across upper abd at this time. States pain is worse with eating. Denies gu sx's. Mild labored breathing noted, pt states she has COPD with inhalers and nebs at home and at her baseline breathing at this time.

## 2024-05-20 NOTE — ED Notes (Signed)
 Pt sats dropped to 82 ra after dilaudid given. 02 2l [place] placed and sats wnl.

## 2024-05-21 MED ORDER — LIDOCAINE VISCOUS HCL 2 % MT SOLN
15.0000 mL | Freq: Once | OROMUCOSAL | Status: AC
  Administered 2024-05-21: 15 mL via ORAL
  Filled 2024-05-21: qty 15

## 2024-05-21 MED ORDER — PANTOPRAZOLE SODIUM 20 MG PO TBEC: 20.0000 mg | DELAYED_RELEASE_TABLET | Freq: Two times a day (BID) | ORAL | 0 refills | Status: DC

## 2024-05-21 MED ORDER — IPRATROPIUM-ALBUTEROL 0.5-2.5 (3) MG/3ML IN SOLN: 3.0000 mL | Freq: Once | RESPIRATORY_TRACT | Status: DC

## 2024-05-21 MED ORDER — ALUM & MAG HYDROXIDE-SIMETH 200-200-20 MG/5ML PO SUSP
30.0000 mL | Freq: Once | ORAL | Status: AC
  Administered 2024-05-21: 30 mL via ORAL
  Filled 2024-05-21: qty 30

## 2024-05-21 MED ORDER — SUCRALFATE 1 GM/10ML PO SUSP: 1.0000 g | Freq: Three times a day (TID) | ORAL | 0 refills | Status: DC

## 2024-05-21 NOTE — Discharge Instructions (Addendum)
 Your CT imaging today showed stomach inflammation.  You may have associated ulcers as well.  This is likely the cause of your ongoing abdominal pain for the past month.  There are steps that you can take to minimize discomfort and promote stomach while healing: -Avoid or minimize smoking -Avoid NSAID medications.  This does include BC powders. -Take Protonix daily.  A prescription for this was sent to your pharmacy.  This will help lower acid content in your stomach -Take Carafate with meals.  A prescription for this was also sent to your pharmacy. -Avoid acidic or spicy foods that can worsen your symptoms. -Take over-the-counter Maalox or Mylanta as needed for stomach discomfort. -Keep your gastroenterology appointment for later this month.  If you have any new or worsening symptoms of concern, return to the emergency department.

## 2024-06-10 ENCOUNTER — Encounter: Payer: Self-pay | Admitting: Gastroenterology

## 2024-06-10 ENCOUNTER — Ambulatory Visit: Admitting: Gastroenterology

## 2024-06-10 VITALS — BP 138/84 | HR 80 | Temp 97.9°F | Ht 64.0 in | Wt 104.8 lb

## 2024-06-10 DIAGNOSIS — R1013 Epigastric pain: Secondary | ICD-10-CM | POA: Diagnosis not present

## 2024-06-10 DIAGNOSIS — K3189 Other diseases of stomach and duodenum: Secondary | ICD-10-CM

## 2024-06-10 DIAGNOSIS — K5900 Constipation, unspecified: Secondary | ICD-10-CM | POA: Diagnosis not present

## 2024-06-10 DIAGNOSIS — K219 Gastro-esophageal reflux disease without esophagitis: Secondary | ICD-10-CM

## 2024-06-10 MED ORDER — PANTOPRAZOLE SODIUM 40 MG PO TBEC: 40.0000 mg | DELAYED_RELEASE_TABLET | Freq: Every day | ORAL | 2 refills | Status: DC

## 2024-06-10 MED ORDER — SUCRALFATE 1 GM/10ML PO SUSP: 1.0000 g | Freq: Three times a day (TID) | ORAL | 0 refills | Status: AC

## 2024-06-10 NOTE — Progress Notes (Signed)
 GI Office Note    Referring Provider: Verneda Charmaine SAUNDERS, FNP Primary Care Physician:  Verneda Charmaine SAUNDERS, FNP  Primary Gastroenterologist: Carlin POUR. Cindie, DO  Chief Complaint   Chief Complaint  Patient presents with   Abdominal Pain    Abdominal pain. Upper stomach pains. Thought is was gallbladder.    History of Present Illness   Teresa Edwards is a 73 y.o. female presenting today at the request of Verneda Charmaine SAUNDERS, FNP for upper abdominal pain.  US  05/02/2024: gallstones.   ED visit 05/20/2024 with abdominal pain, nausea, and vomiting.  She describes her pain as periumbilical and radiates across both sides of her abdomen.  And a pelvic ultrasound recently which showed 4 mm endometrial lining and did not feel need for endometrial biopsy at that time.  She states her pain has been ongoing worsening over the past several days described as 10 out of 10.  On EDP exam she was noted to have generalized abdominal tenderness.  She is given GI cocktail and treated with Dilaudid , IV fluids, and had a CT scan obtained.  She is given prescription for pantoprazole  20 mg twice daily and Carafate  3 times a day with meals.  CT A/P with contrast 05/20/2024: - Gallbladder unremarkable without biliary ductal dilation - Diffuse pancreatic atrophy - Prominent pancreatic duct measuring 5 mm without inflammation or obvious mass - Prominent wall thickening involving the antral pyloric region and duodenal bulb with mucosal hyperenhancement perigastric stranding as well as mild stranding around the duodenal bulb suspicious for gastritis/duodenitis or peptic ulcer disease.  Nothing to suggest perforation.  Upper endoscopy recommended. - Moderate stool in the colon  Today:  Discussed the use of AI scribe software for clinical note transcription with the patient, who gave verbal consent to proceed.  Daughter present with her today.   She experiences abdominal pain primarily in the central upper  abdomen. Earlier this month, she visited the ER due to the pain and underwent a CT scan with contrast and an ultrasound. The ultrasound with primary care office showed a slightly distended gallbladder but no gallstones or sludge, and normal bowel duct and pancreas. The CT scan revealed wall thickening near the stomach and small bowel junction. She experiences occasional nausea and vomiting, but these symptoms are not frequent.  She has a history of taking BC powders for arthritis pain, which she has reduced significantly since her stomach pain began. Her stomach pain was severe enough to prevent her from walking without holding her stomach, but after receiving medication from the ER, including pantoprazole  and a GI cocktail, she experienced relief. She is currently taking sucralfate  liquid and pantoprazole  twice daily, which was initially prescribed at a low dose by ED physician.   She reports difficulty eating due to stomach pain, stating that everything she ate made her stomach hurt. However, she has been able to eat more recently. She avoids spicy and fried foods, which she finds irritating to her stomach.  She has a history of constipation, which she manages with occasional use of Miralax and magnesium  citrate. She is reluctant to use laxatives regularly but acknowledges the need for them when experiencing constipation.  She experienced significant weight loss after being sick last February, dropping to 104 pounds from 210 pounds. She attributes the weight loss to reduced food intake due to stomach pain.  She has a history of pulmonary issues, which were more prominent during her illness last year. No recent heart stents or significant cardiac issues,  with her last echocardiogram showing normal cardiac function.  She experienced a single episode of vaginal bleeding, which she associates with taking gabapentin. The bleeding was brief and resolved within a day. has had GYN workup for this.      Colonoscopy: more than 10 years ago. Normal - no polyps per patient.   Wt Readings from Last 6 Encounters:  06/10/24 104 lb 12.8 oz (47.5 kg)  05/20/24 107 lb (48.5 kg)  02/14/24 108 lb (49 kg)  01/16/24 110 lb (49.9 kg)  11/26/23 107 lb (48.5 kg)  10/16/23 107 lb 12.8 oz (48.9 kg)    Body mass index is 17.99 kg/m.  Current Outpatient Medications  Medication Sig Dispense Refill   albuterol  (PROVENTIL ) (2.5 MG/3ML) 0.083% nebulizer solution Take 3 mLs (2.5 mg total) by nebulization every 6 (six) hours as needed for wheezing or shortness of breath. 75 mL 12   albuterol  (VENTOLIN  HFA) 108 (90 Base) MCG/ACT inhaler Inhale 2 puffs into the lungs every 6 (six) hours as needed for wheezing or shortness of breath. 18 g 6   diltiazem  (CARDIZEM  CD) 180 MG 24 hr capsule Take 180 mg by mouth daily.     Fluticasone -Umeclidin-Vilant (TRELEGY ELLIPTA ) 200-62.5-25 MCG/ACT AEPB Inhale 1 puff into the lungs daily. 60 each 11   Isavuconazonium Sulfate  (CRESEMBA ) 186 MG CAPS 2 capsules by mouth once daily. 60 capsule 8   oxyCODONE -acetaminophen  (PERCOCET) 10-325 MG tablet Take 1 tablet by mouth 2 (two) times daily as needed.     pantoprazole  (PROTONIX ) 20 MG tablet Take 1 tablet (20 mg total) by mouth 2 (two) times daily. 60 tablet 0   sucralfate  (CARAFATE ) 1 GM/10ML suspension Take 10 mLs (1 g total) by mouth with breakfast, with lunch, and with evening meal. 420 mL 0   amoxicillin -clavulanate (AUGMENTIN ) 875-125 MG tablet Take 1 tablet by mouth 2 (two) times daily. (Patient not taking: Reported on 02/14/2024) 14 tablet 0   losartan  (COZAAR ) 25 MG tablet Take 25 mg by mouth daily. (Patient not taking: Reported on 06/10/2024)     oxycodone  (ROXICODONE ) 30 MG immediate release tablet Take 15-30 mg by mouth every 4 (four) hours as needed for pain.  (Patient not taking: Reported on 06/10/2024)     No current facility-administered medications for this visit.    Past Medical History:  Diagnosis Date    Aortic atherosclerosis    Cavitary lung disease    Chronic pain    COPD (chronic obstructive pulmonary disease) (HCC)    Coronary artery calcification seen on CT scan    DDD (degenerative disc disease)    DVT femoral (deep venous thrombosis) with thrombophlebitis (HCC)    Dyspnea    Pt states she has had for months   Hypertension     Past Surgical History:  Procedure Laterality Date   BRONCHIAL BIOPSY  06/22/2022   Procedure: BRONCHIAL BIOPSIES;  Surgeon: Gladis Leonor HERO, MD;  Location: [facility] ENDOSCOPY;  Service: Pulmonary;;   BRONCHIAL NEEDLE ASPIRATION BIOPSY  06/22/2022   Procedure: BRONCHIAL NEEDLE ASPIRATION BIOPSIES;  Surgeon: Gladis Leonor HERO, MD;  Location: [facility] ENDOSCOPY;  Service: Pulmonary;;   BRONCHIAL WASHINGS  06/22/2022   Procedure: BRONCHIAL WASHINGS;  Surgeon: Gladis Leonor HERO, MD;  Location: [facility] ENDOSCOPY;  Service: Pulmonary;;   TOTAL HIP ARTHROPLASTY Right    TUBAL LIGATION  10/1971    Family History  Problem Relation Age of Onset   Deep vein thrombosis Mother    Other Mother        varicose  veins   Hypertension Sister    Other Sister        varicose veins   Cancer Brother     Allergies as of 06/10/2024   (No Known Allergies)    Social History   Socioeconomic History   Marital status: Widowed    Spouse name: Not on file   Number of children: 2   Years of education: 41   Highest education level: Not on file  Occupational History   Occupation: homemaker  Tobacco Use   Smoking status: Every Day    Current packs/day: 3.00    Average packs/day: 3.0 packs/day for 40.0 years (120.0 ttl pk-yrs)    Types: Cigarettes   Smokeless tobacco: Never   Tobacco comments:    Smoking about 1 pack a week  Vaping Use   Vaping status: Never Used  Substance and Sexual Activity   Alcohol use: No    Alcohol/week: 0.0 standard drinks of alcohol   Drug use: No   Sexual activity: Not on file  Other Topics Concern   Not on file  Social History Narrative   Not on  file   Social Drivers of Health   Financial Resource Strain: Not on file  Food Insecurity: No Food Insecurity (03/19/2024)   Received from [facility]   Hunger Vital Sign    Within the past 12 months, you worried that your food would run out before you got the money to buy more.: Never true    Within the past 12 months, the food you bought just didn't last and you didn't have money to get more.: Never true  Transportation Needs: No Transportation Needs (03/19/2024)   Received from [facility]   PRAPARE - Transportation    Lack of Transportation (Medical): No    Lack of Transportation (Non-Medical): No  Physical Activity: Not on file  Stress: Not on file  Social Connections: Socially Isolated (08/20/2023)   Social Connection and Isolation Panel    Frequency of Communication with Friends and Family: Once a week    Frequency of Social Gatherings with Friends and Family: More than three times a week    Attends Religious Services: Never    Database Administrator or Organizations: No    Attends Banker Meetings: Never    Marital Status: Widowed  Intimate Partner Violence: Not At Risk (03/19/2024)   Received from [facility]   Humiliation, Afraid, Rape, and Kick questionnaire    Within the last year, have you been afraid of your partner or ex-partner?: No    Within the last year, have you been humiliated or emotionally abused in other ways by your partner or ex-partner?: No    Within the last year, have you been kicked, hit, slapped, or otherwise physically hurt by your partner or ex-partner?: No    Within the last year, have you been raped or forced to have any kind of sexual activity by your partner or ex-partner?: No    Review of Systems   Gen: + weight loss. Denies any fever, chills, fatigue, lack of appetite.  CV: Denies chest pain, heart palpitations, peripheral edema, syncope.  Resp: + dyspnea on exertion. Denies shortness of breath at rest. Denies wheezing or  cough.  GI: see HPI GU : Denies urinary burning, urinary frequency, urinary hesitancy MS: + weakness and limited movement. + scoliosis. Denies joint pain, muscle weakness, cramps,  Psych: Denies depression, anxiety, memory loss, and confusion Heme: Denies bruising, bleeding, and  enlarged lymph nodes.  Physical Exam   BP 138/84 (BP Location: Right Arm, Patient Position: Sitting, Cuff Size: Normal)   Pulse 80   Temp 97.9 F (36.6 C) (Temporal)   Ht 5' 4 (1.626 m)   Wt 104 lb 12.8 oz (47.5 kg)   BMI 17.99 kg/m   General:   Alert and oriented.  Chronically ill-appearing, very thin. Head:  Normocephalic and atraumatic. Eyes:  Without icterus, sclera clear and conjunctiva pink.  Ears:  Normal auditory acuity. Mouth:  poor dentition.  Lungs:  Clear to auscultation bilaterally, diminished bases. No wheezes, rales, or rhonchi. No distress.  Heart:  S1, S2 present without murmurs appreciated.  Abdomen:  +BS, soft, non-tender and non-distended. No HSM noted. No guarding or rebound. No masses appreciated.  Rectal:  deferred Msk:  Normal posture.  Hunchback, using a single-point cane.  Weak gait. Extremities:  Without edema.  Very thin. Neurologic:  Alert and  oriented x4;  grossly normal neurologically. Skin:  Intact without significant lesions or rashes. Psych:  Alert and cooperative. Normal mood and affect.  Assessment & Plan   Kaytlynn DECLYNN LOPRESTI is a 73 y.o. female with a history of COPD, pulmonary aspergillosis, DVT, HTN, anxiety, chronic pain presenting today with epigastric pain and abnormal CT of the stomach and small bowel.    Gastric and duodenal wall thickening on CT scan with epigastric pain, suspected PUD Chronic inflammation and potential ulceration in the stomach and duodenal region, likely due to NSAID use (BC powders). CT scan shows wall thickening near the stomach and small bowel junction. Symptoms include severe epigastric pain, nausea, and vomiting. Pantoprazole  and  sucralfate  have provided some relief. Differential includes NSAID-induced ulcers, viral infection, or other inflammatory causes. Upper endoscopy is recommended to confirm diagnosis and rule out malignancy and H. Pylori.  - Increased pantoprazole  to 40 mg twice daily. - Continue sucralfate  TID for two more weeks. - Ordered upper endoscopy with biopsy to confirm diagnosis and rule out other causes - Advised dietary modifications to avoid spicy and fried foods. - Advised avoidance of NSAIDs  Procedure order: Proceed with upper endoscopy with propofol  by Dr. Cindie in near future: the risks, benefits, and alternatives have been discussed with the patient in detail. The patient states understanding and desires to proceed. ASA 3  Constipation Chronic constipation with associated abdominal discomfort and reduced appetite. Previous use of magnesium  citrate for relief. Miralax recommended for long-term management due to safety and efficacy. Large stool burden on CT scan.  - Recommended Miralax once daily, increase to twice daily if needed. - Discussed potential use of Miralax in daily beverages for ease of use.      Follow up   Follow up 3 months.    Charmaine Melia, MSN, FNP-BC, AGACNP-BC [facility]

## 2024-06-10 NOTE — Patient Instructions (Addendum)
 We will get you scheduled for upper endoscopy in the near future with Dr. Cindie to further evaluate your CT find as well as your epigastric pain.  Is very important to avoid any BC powders or Goody powders.  Also important to avoid any ibuprofen, Aleve, Advil, meloxicam, Mobic, etc.  I will refill your Carafate  for you today able to continue to take this 3 times a day for the next 2 weeks and I want you to start increasing your pantoprazole  to 40 mg twice daily.  I sent in a new prescription for you but for now you can continue to take the 20 mg capsules that you have been take 2 of them twice a day until you run out and then start taking your new prescription.  For your constipation I want you to start taking MiraLAX 1 capful in 6-8 oz water daily to help with constipation.  We will see you for follow-up after your procedure  It was a pleasure to see you today. I want to create trusting relationships with patients. If you receive a survey regarding your visit,  I greatly appreciate you taking time to fill this out on paper or through your MyChart. I value your feedback.  Charmaine Melia, MSN, FNP-BC, AGACNP-BC [facility]

## 2024-06-10 NOTE — H&P (View-Only) (Signed)
 GI Office Note    Referring Provider: Verneda Charmaine SAUNDERS, Teresa Edwards Primary Care Physician:  Teresa Charmaine SAUNDERS, Teresa Edwards  Primary Gastroenterologist: Carlin POUR. Cindie, DO  Chief Complaint   Chief Complaint  Patient presents with   Abdominal Pain    Abdominal pain. Upper stomach pains. Thought is was gallbladder.    History of Present Illness   Teresa Edwards is a 73 y.o. female presenting today at the request of Teresa Charmaine SAUNDERS, Teresa Edwards for upper abdominal pain.  US  05/02/2024: gallstones.   ED visit 05/20/2024 with abdominal pain, nausea, and vomiting.  She describes her pain as periumbilical and radiates across both sides of her abdomen.  And a pelvic ultrasound recently which showed 4 mm endometrial lining and did not feel need for endometrial biopsy at that time.  She states her pain has been ongoing worsening over the past several days described as 10 out of 10.  On EDP exam she was noted to have generalized abdominal tenderness.  She is given GI cocktail and treated with Dilaudid , IV fluids, and had a CT scan obtained.  She is given prescription for pantoprazole  20 mg twice daily and Carafate  3 times a day with meals.  CT A/P with contrast 05/20/2024: - Gallbladder unremarkable without biliary ductal dilation - Diffuse pancreatic atrophy - Prominent pancreatic duct measuring 5 mm without inflammation or obvious mass - Prominent wall thickening involving the antral pyloric region and duodenal bulb with mucosal hyperenhancement perigastric stranding as well as mild stranding around the duodenal bulb suspicious for gastritis/duodenitis or peptic ulcer disease.  Nothing to suggest perforation.  Upper endoscopy recommended. - Moderate stool in the colon  Today:  Discussed the use of AI scribe software for clinical note transcription with the patient, who gave verbal consent to proceed.  Daughter present with her today.   She experiences abdominal pain primarily in the central upper  abdomen. Earlier this month, she visited the ER due to the pain and underwent a CT scan with contrast and an ultrasound. The ultrasound with primary care office showed a slightly distended gallbladder but no gallstones or sludge, and normal bowel duct and pancreas. The CT scan revealed wall thickening near the stomach and small bowel junction. She experiences occasional nausea and vomiting, but these symptoms are not frequent.  She has a history of taking BC powders for arthritis pain, which she has reduced significantly since her stomach pain began. Her stomach pain was severe enough to prevent her from walking without holding her stomach, but after receiving medication from the ER, including pantoprazole  and a GI cocktail, she experienced relief. She is currently taking sucralfate  liquid and pantoprazole  twice daily, which was initially prescribed at a low dose by ED physician.   She reports difficulty eating due to stomach pain, stating that everything she ate made her stomach hurt. However, she has been able to eat more recently. She avoids spicy and fried foods, which she finds irritating to her stomach.  She has a history of constipation, which she manages with occasional use of Miralax and magnesium  citrate. She is reluctant to use laxatives regularly but acknowledges the need for them when experiencing constipation.  She experienced significant weight loss after being sick last February, dropping to 104 pounds from 210 pounds. She attributes the weight loss to reduced food intake due to stomach pain.  She has a history of pulmonary issues, which were more prominent during her illness last year. No recent heart stents or significant cardiac issues,  with her last echocardiogram showing normal cardiac function.  She experienced a single episode of vaginal bleeding, which she associates with taking gabapentin. The bleeding was brief and resolved within a day. has had GYN workup for this.      Colonoscopy: more than 10 years ago. Normal - no polyps per patient.   Wt Readings from Last 6 Encounters:  06/10/24 104 lb 12.8 oz (47.5 kg)  05/20/24 107 lb (48.5 kg)  02/14/24 108 lb (49 kg)  01/16/24 110 lb (49.9 kg)  11/26/23 107 lb (48.5 kg)  10/16/23 107 lb 12.8 oz (48.9 kg)    Body mass index is 17.99 kg/m.  Current Outpatient Medications  Medication Sig Dispense Refill   albuterol  (PROVENTIL ) (2.5 MG/3ML) 0.083% nebulizer solution Take 3 mLs (2.5 mg total) by nebulization every 6 (six) hours as needed for wheezing or shortness of breath. 75 mL 12   albuterol  (VENTOLIN  HFA) 108 (90 Base) MCG/ACT inhaler Inhale 2 puffs into the lungs every 6 (six) hours as needed for wheezing or shortness of breath. 18 g 6   diltiazem  (CARDIZEM  CD) 180 MG 24 hr capsule Take 180 mg by mouth daily.     Fluticasone -Umeclidin-Vilant (TRELEGY ELLIPTA ) 200-62.5-25 MCG/ACT AEPB Inhale 1 puff into the lungs daily. 60 each 11   Isavuconazonium Sulfate  (CRESEMBA ) 186 MG CAPS 2 capsules by mouth once daily. 60 capsule 8   oxyCODONE -acetaminophen  (PERCOCET) 10-325 MG tablet Take 1 tablet by mouth 2 (two) times daily as needed.     pantoprazole  (PROTONIX ) 20 MG tablet Take 1 tablet (20 mg total) by mouth 2 (two) times daily. 60 tablet 0   sucralfate  (CARAFATE ) 1 GM/10ML suspension Take 10 mLs (1 g total) by mouth with breakfast, with lunch, and with evening meal. 420 mL 0   amoxicillin -clavulanate (AUGMENTIN ) 875-125 MG tablet Take 1 tablet by mouth 2 (two) times daily. (Patient not taking: Reported on 02/14/2024) 14 tablet 0   losartan  (COZAAR ) 25 MG tablet Take 25 mg by mouth daily. (Patient not taking: Reported on 06/10/2024)     oxycodone  (ROXICODONE ) 30 MG immediate release tablet Take 15-30 mg by mouth every 4 (four) hours as needed for pain.  (Patient not taking: Reported on 06/10/2024)     No current facility-administered medications for this visit.    Past Medical History:  Diagnosis Date    Aortic atherosclerosis    Cavitary lung disease    Chronic pain    COPD (chronic obstructive pulmonary disease) (HCC)    Coronary artery calcification seen on CT scan    DDD (degenerative disc disease)    DVT femoral (deep venous thrombosis) with thrombophlebitis (HCC)    Dyspnea    Pt states she has had for months   Hypertension     Past Surgical History:  Procedure Laterality Date   BRONCHIAL BIOPSY  06/22/2022   Procedure: BRONCHIAL BIOPSIES;  Surgeon: Gladis Leonor HERO, MD;  Location: [facility] ENDOSCOPY;  Service: Pulmonary;;   BRONCHIAL NEEDLE ASPIRATION BIOPSY  06/22/2022   Procedure: BRONCHIAL NEEDLE ASPIRATION BIOPSIES;  Surgeon: Gladis Leonor HERO, MD;  Location: [facility] ENDOSCOPY;  Service: Pulmonary;;   BRONCHIAL WASHINGS  06/22/2022   Procedure: BRONCHIAL WASHINGS;  Surgeon: Gladis Leonor HERO, MD;  Location: [facility] ENDOSCOPY;  Service: Pulmonary;;   TOTAL HIP ARTHROPLASTY Right    TUBAL LIGATION  10/1971    Family History  Problem Relation Age of Onset   Deep vein thrombosis Mother    Other Mother        varicose  veins   Hypertension Sister    Other Sister        varicose veins   Cancer Brother     Allergies as of 06/10/2024   (No Known Allergies)    Social History   Socioeconomic History   Marital status: Widowed    Spouse name: Not on file   Number of children: 2   Years of education: 41   Highest education level: Not on file  Occupational History   Occupation: homemaker  Tobacco Use   Smoking status: Every Day    Current packs/day: 3.00    Average packs/day: 3.0 packs/day for 40.0 years (120.0 ttl pk-yrs)    Types: Cigarettes   Smokeless tobacco: Never   Tobacco comments:    Smoking about 1 pack a week  Vaping Use   Vaping status: Never Used  Substance and Sexual Activity   Alcohol use: No    Alcohol/week: 0.0 standard drinks of alcohol   Drug use: No   Sexual activity: Not on file  Other Topics Concern   Not on file  Social History Narrative   Not on  file   Social Drivers of Health   Financial Resource Strain: Not on file  Food Insecurity: No Food Insecurity (03/19/2024)   Received from [facility]   Hunger Vital Sign    Within the past 12 months, you worried that your food would run out before you got the money to buy more.: Never true    Within the past 12 months, the food you bought just didn't last and you didn't have money to get more.: Never true  Transportation Needs: No Transportation Needs (03/19/2024)   Received from [facility]   PRAPARE - Transportation    Lack of Transportation (Medical): No    Lack of Transportation (Non-Medical): No  Physical Activity: Not on file  Stress: Not on file  Social Connections: Socially Isolated (08/20/2023)   Social Connection and Isolation Panel    Frequency of Communication with Friends and Family: Once a week    Frequency of Social Gatherings with Friends and Family: More than three times a week    Attends Religious Services: Never    Database Administrator or Organizations: No    Attends Banker Meetings: Never    Marital Status: Widowed  Intimate Partner Violence: Not At Risk (03/19/2024)   Received from [facility]   Humiliation, Afraid, Rape, and Kick questionnaire    Within the last year, have you been afraid of your partner or ex-partner?: No    Within the last year, have you been humiliated or emotionally abused in other ways by your partner or ex-partner?: No    Within the last year, have you been kicked, hit, slapped, or otherwise physically hurt by your partner or ex-partner?: No    Within the last year, have you been raped or forced to have any kind of sexual activity by your partner or ex-partner?: No    Review of Systems   Gen: + weight loss. Denies any fever, chills, fatigue, lack of appetite.  CV: Denies chest pain, heart palpitations, peripheral edema, syncope.  Resp: + dyspnea on exertion. Denies shortness of breath at rest. Denies wheezing or  cough.  GI: see HPI GU : Denies urinary burning, urinary frequency, urinary hesitancy MS: + weakness and limited movement. + scoliosis. Denies joint pain, muscle weakness, cramps,  Psych: Denies depression, anxiety, memory loss, and confusion Heme: Denies bruising, bleeding, and  enlarged lymph nodes.  Physical Exam   BP 138/84 (BP Location: Right Arm, Patient Position: Sitting, Cuff Size: Normal)   Pulse 80   Temp 97.9 F (36.6 C) (Temporal)   Ht 5' 4 (1.626 m)   Wt 104 lb 12.8 oz (47.5 kg)   BMI 17.99 kg/m   General:   Alert and oriented.  Chronically ill-appearing, very thin. Head:  Normocephalic and atraumatic. Eyes:  Without icterus, sclera clear and conjunctiva pink.  Ears:  Normal auditory acuity. Mouth:  poor dentition.  Lungs:  Clear to auscultation bilaterally, diminished bases. No wheezes, rales, or rhonchi. No distress.  Heart:  S1, S2 present without murmurs appreciated.  Abdomen:  +BS, soft, non-tender and non-distended. No HSM noted. No guarding or rebound. No masses appreciated.  Rectal:  deferred Msk:  Normal posture.  Hunchback, using a single-point cane.  Weak gait. Extremities:  Without edema.  Very thin. Neurologic:  Alert and  oriented x4;  grossly normal neurologically. Skin:  Intact without significant lesions or rashes. Psych:  Alert and cooperative. Normal mood and affect.  Assessment & Plan   Kaytlynn DECLYNN LOPRESTI is a 73 y.o. female with a history of COPD, pulmonary aspergillosis, DVT, HTN, anxiety, chronic pain presenting today with epigastric pain and abnormal CT of the stomach and small bowel.    Gastric and duodenal wall thickening on CT scan with epigastric pain, suspected PUD Chronic inflammation and potential ulceration in the stomach and duodenal region, likely due to NSAID use (BC powders). CT scan shows wall thickening near the stomach and small bowel junction. Symptoms include severe epigastric pain, nausea, and vomiting. Pantoprazole  and  sucralfate  have provided some relief. Differential includes NSAID-induced ulcers, viral infection, or other inflammatory causes. Upper endoscopy is recommended to confirm diagnosis and rule out malignancy and H. Pylori.  - Increased pantoprazole  to 40 mg twice daily. - Continue sucralfate  TID for two more weeks. - Ordered upper endoscopy with biopsy to confirm diagnosis and rule out other causes - Advised dietary modifications to avoid spicy and fried foods. - Advised avoidance of NSAIDs  Procedure order: Proceed with upper endoscopy with propofol  by Dr. Cindie in near future: the risks, benefits, and alternatives have been discussed with the patient in detail. The patient states understanding and desires to proceed. ASA 3  Constipation Chronic constipation with associated abdominal discomfort and reduced appetite. Previous use of magnesium  citrate for relief. Miralax recommended for long-term management due to safety and efficacy. Large stool burden on CT scan.  - Recommended Miralax once daily, increase to twice daily if needed. - Discussed potential use of Miralax in daily beverages for ease of use.      Follow up   Follow up 3 months.    Charmaine Melia, MSN, Teresa Edwards-BC, AGACNP-BC [facility]

## 2024-06-11 ENCOUNTER — Telehealth: Payer: Self-pay | Admitting: *Deleted

## 2024-06-11 ENCOUNTER — Encounter: Payer: Self-pay | Admitting: *Deleted

## 2024-06-11 NOTE — Telephone Encounter (Signed)
 Pt has been scheduled for 07/01/24. Instructions mailed.

## 2024-06-11 NOTE — Telephone Encounter (Signed)
LMOVM to call back to schedule EGD with Dr. Abbey Chatters, ASA 3

## 2024-06-26 ENCOUNTER — Encounter (HOSPITAL_COMMUNITY): Admission: RE | Admit: 2024-06-26 | Discharge: 2024-06-26 | Attending: Internal Medicine

## 2024-06-26 NOTE — Pre-Procedure Instructions (Signed)
 Attempted pre-op phonecall. Left VM for her to call us  back.

## 2024-06-30 ENCOUNTER — Encounter (HOSPITAL_COMMUNITY): Payer: Self-pay

## 2024-06-30 ENCOUNTER — Other Ambulatory Visit: Payer: Self-pay

## 2024-07-01 ENCOUNTER — Other Ambulatory Visit: Payer: Self-pay

## 2024-07-01 ENCOUNTER — Encounter (HOSPITAL_COMMUNITY): Payer: Self-pay | Admitting: Internal Medicine

## 2024-07-01 ENCOUNTER — Ambulatory Visit (HOSPITAL_COMMUNITY)
Admission: RE | Admit: 2024-07-01 | Discharge: 2024-07-01 | Disposition: A | Attending: Internal Medicine | Admitting: Internal Medicine

## 2024-07-01 ENCOUNTER — Ambulatory Visit (HOSPITAL_COMMUNITY): Admitting: Certified Registered Nurse Anesthetist

## 2024-07-01 ENCOUNTER — Encounter (HOSPITAL_COMMUNITY): Admission: RE | Disposition: A | Payer: Self-pay | Source: Home / Self Care | Attending: Internal Medicine

## 2024-07-01 DIAGNOSIS — K259 Gastric ulcer, unspecified as acute or chronic, without hemorrhage or perforation: Secondary | ICD-10-CM | POA: Diagnosis not present

## 2024-07-01 DIAGNOSIS — I10 Essential (primary) hypertension: Secondary | ICD-10-CM | POA: Diagnosis not present

## 2024-07-01 DIAGNOSIS — K2970 Gastritis, unspecified, without bleeding: Secondary | ICD-10-CM | POA: Diagnosis not present

## 2024-07-01 DIAGNOSIS — M1990 Unspecified osteoarthritis, unspecified site: Secondary | ICD-10-CM | POA: Diagnosis not present

## 2024-07-01 DIAGNOSIS — I2510 Atherosclerotic heart disease of native coronary artery without angina pectoris: Secondary | ICD-10-CM | POA: Diagnosis not present

## 2024-07-01 DIAGNOSIS — J449 Chronic obstructive pulmonary disease, unspecified: Secondary | ICD-10-CM | POA: Diagnosis not present

## 2024-07-01 DIAGNOSIS — K8020 Calculus of gallbladder without cholecystitis without obstruction: Secondary | ICD-10-CM | POA: Diagnosis present

## 2024-07-01 DIAGNOSIS — Z79899 Other long term (current) drug therapy: Secondary | ICD-10-CM | POA: Diagnosis not present

## 2024-07-01 DIAGNOSIS — K2289 Other specified disease of esophagus: Secondary | ICD-10-CM | POA: Insufficient documentation

## 2024-07-01 DIAGNOSIS — F17210 Nicotine dependence, cigarettes, uncomplicated: Secondary | ICD-10-CM | POA: Diagnosis not present

## 2024-07-01 DIAGNOSIS — K209 Esophagitis, unspecified without bleeding: Secondary | ICD-10-CM | POA: Insufficient documentation

## 2024-07-01 HISTORY — PX: ESOPHAGOGASTRODUODENOSCOPY: SHX5428

## 2024-07-01 SURGERY — EGD (ESOPHAGOGASTRODUODENOSCOPY)
Anesthesia: Monitor Anesthesia Care

## 2024-07-01 MED ORDER — GLYCOPYRROLATE 0.2 MG/ML IJ SOLN
INTRAMUSCULAR | Status: DC | PRN
  Administered 2024-07-01: 08:00:00 .1 mg via INTRAVENOUS

## 2024-07-01 MED ORDER — LACTATED RINGERS IV SOLN: INTRAVENOUS | Status: DC | PRN

## 2024-07-01 MED ORDER — PANTOPRAZOLE SODIUM 40 MG PO TBEC: 40.0000 mg | DELAYED_RELEASE_TABLET | Freq: Two times a day (BID) | ORAL | 11 refills | Status: AC

## 2024-07-01 MED ORDER — LACTATED RINGERS IV SOLN: INTRAVENOUS | Status: DC

## 2024-07-01 MED ORDER — PROPOFOL 10 MG/ML IV BOLUS
INTRAVENOUS | Status: DC | PRN
  Administered 2024-07-01: 08:00:00 25 mg via INTRAVENOUS
  Administered 2024-07-01: 08:00:00 75 mg via INTRAVENOUS

## 2024-07-01 MED ORDER — GLYCOPYRROLATE PF 0.2 MG/ML IJ SOSY
PREFILLED_SYRINGE | INTRAMUSCULAR | Status: AC
  Filled 2024-07-01: qty 1

## 2024-07-01 MED ORDER — PROPOFOL 10 MG/ML IV BOLUS
INTRAVENOUS | Status: AC
  Filled 2024-07-01: qty 20

## 2024-07-01 NOTE — Interval H&P Note (Signed)
 History and Physical Interval Note:  07/01/2024 7:53 AM  Teresa Edwards  has presented today for surgery, with the diagnosis of epigastric pain,gastric wall thickening on CT,concern for PUD.  The various methods of treatment have been discussed with the patient and family. After consideration of risks, benefits and other options for treatment, the patient has consented to  Procedures with comments: EGD (ESOPHAGOGASTRODUODENOSCOPY) (N/A) - 8:00 am, asa 3 as a surgical intervention.  The patient's history has been reviewed, patient examined, no change in status, stable for surgery.  I have reviewed the patient's chart and labs.  Questions were answered to the patient's satisfaction.     Carlin MARLA Hasty

## 2024-07-01 NOTE — Anesthesia Preprocedure Evaluation (Signed)
 Anesthesia Evaluation  Patient identified by MRN, date of birth, ID band Patient awake    Reviewed: Allergy & Precautions, H&P , NPO status , Patient's Chart, lab work & pertinent test results, reviewed documented beta blocker date and time   Airway Mallampati: II  TM Distance: >3 FB Neck ROM: full    Dental no notable dental hx.    Pulmonary shortness of breath, pneumonia, COPD, Current Smoker   Pulmonary exam normal breath sounds clear to auscultation       Cardiovascular Exercise Tolerance: Good hypertension, + CAD   Rhythm:regular Rate:Normal     Neuro/Psych   Anxiety     negative neurological ROS  negative psych ROS   GI/Hepatic negative GI ROS, Neg liver ROS,,,  Endo/Other  negative endocrine ROS    Renal/GU negative Renal ROS  negative genitourinary   Musculoskeletal   Abdominal   Peds  Hematology negative hematology ROS (+)   Anesthesia Other Findings   Reproductive/Obstetrics negative OB ROS                              Anesthesia Physical Anesthesia Plan  ASA: 3  Anesthesia Plan: MAC   Post-op Pain Management:    Induction:   PONV Risk Score and Plan: Propofol  infusion  Airway Management Planned:   Additional Equipment:   Intra-op Plan:   Post-operative Plan:   Informed Consent: I have reviewed the patients History and Physical, chart, labs and discussed the procedure including the risks, benefits and alternatives for the proposed anesthesia with the patient or authorized representative who has indicated his/her understanding and acceptance.     Dental Advisory Given  Plan Discussed with: CRNA  Anesthesia Plan Comments:         Anesthesia Quick Evaluation

## 2024-07-01 NOTE — Transfer of Care (Signed)
 Immediate Anesthesia Transfer of Care Note  Patient: Teresa Edwards  Procedure(s) Performed: EGD (ESOPHAGOGASTRODUODENOSCOPY)  Patient Location: PACU  Anesthesia Type:MAC  Level of Consciousness: awake and drowsy  Airway & Oxygen  Therapy: Patient Spontanous Breathing  Post-op Assessment: Report given to RN and Patient moving all extremities X 4  Post vital signs: Reviewed and stable  Last Vitals:  Vitals Value Taken Time  BP    Temp    Pulse    Resp    SpO2      Last Pain:  Vitals:   07/01/24 0807  TempSrc:   PainSc: 10-Worst pain ever         Complications: No notable events documented.

## 2024-07-01 NOTE — Discharge Instructions (Addendum)
 EGD Discharge instructions Please read the instructions outlined below and refer to this sheet in the next few weeks. These discharge instructions provide you with general information on caring for yourself after you leave the hospital. Your doctor may also give you specific instructions. While your treatment has been planned according to the most current medical practices available, unavoidable complications occasionally occur. If you have any problems or questions after discharge, please call your doctor. ACTIVITY You may resume your regular activity but move at a slower pace for the next 24 hours.  Take frequent rest periods for the next 24 hours.  Walking will help expel (get rid of) the air and reduce the bloated feeling in your abdomen.  No driving for 24 hours (because of the anesthesia (medicine) used during the test).  You may shower.  Do not sign any important legal documents or operate any machinery for 24 hours (because of the anesthesia used during the test).  NUTRITION Drink plenty of fluids.  You may resume your normal diet.  Begin with a light meal and progress to your normal diet.  Avoid alcoholic beverages for 24 hours or as instructed by your caregiver.  MEDICATIONS You may resume your normal medications unless your caregiver tells you otherwise.  WHAT YOU CAN EXPECT TODAY You may experience abdominal discomfort such as a feeling of fullness or gas pains.  FOLLOW-UP Your doctor will discuss the results of your test with you.  SEEK IMMEDIATE MEDICAL ATTENTION IF ANY OF THE FOLLOWING OCCUR: Excessive nausea (feeling sick to your stomach) and/or vomiting.  Severe abdominal pain and distention (swelling).  Trouble swallowing.  Temperature over 101 F (37.8 C).  Rectal bleeding or vomiting of blood.   Your upper endoscopy revealed evidence of esophagitis.  I took samples today.  You also have a large ulcer in the end portion of your stomach.  I took samples of this as  well as your stomach.  We will have these results in 1 to 2 weeks.  Small bowel was normal.  Likely ulcer was from BC powder use.  It is imperative that you avoid all NSAIDs.  Tylenol  okay.  Continue on pantoprazole  twice daily.  I sent more refills to your pharmacy today.  Follow-up in GI office in 6 to 8 weeks.  We will need to repeat upper endoscopy in 3 months to ensure ulcer is healed.   I hope you have a great rest of your week!  Carlin POUR. Cindie, D.O. Gastroenterology and Hepatology [facility]

## 2024-07-01 NOTE — Op Note (Signed)
 [facility] Patient Name: Teresa Edwards Procedure Date: 07/01/2024 7:04 AM MRN: [id number] Date of Birth: 1951-02-19 Attending MD: Carlin POUR. Cindie , [place], [id number] CSN: [id number] Age: 73 Admit Type: Outpatient Procedure:                Upper GI endoscopy Indications:              Epigastric abdominal pain, Abnormal CT of the GI                            tract, Nausea with vomiting Providers:                Carlin POUR. Cindie, DO, Devere Lodge, Bascom Blush Referring MD:              Medicines:                See the Anesthesia note for documentation of the                            administered medications Complications:            No immediate complications. Estimated Blood Loss:     Estimated blood loss was minimal. Procedure:                Pre-Anesthesia Assessment:                           - The anesthesia plan was to use monitored                            anesthesia care (MAC).                           After obtaining informed consent, the endoscope was                            passed under direct vision. Throughout the                            procedure, the patient's blood pressure, pulse, and                            oxygen  saturations were monitored continuously. The                            [id number] ([id number]) was introduced through the                            mouth, and advanced to the second part of duodenum.                            The Endoscope was introduced through the and                            advanced to the. The upper GI endoscopy was  accomplished without difficulty. The patient                            tolerated the procedure well. Scope In: 8:11:41 AM Scope Out: 8:17:19 AM Total Procedure Duration: 0 hours 5 minutes 38 seconds  Findings:      Esophagitis with no bleeding was found in the lower third of the       esophagus. Biopsies were taken with a cold forceps for histology.      The Z-line was  irregular. Biopsies were taken with a cold forceps for       histology.      Diffuse moderate inflammation was found in the entire examined stomach.       Biopsies were taken with a cold forceps for Helicobacter pylori testing.      One non-bleeding cratered gastric ulcer with a clean ulcer base (Forrest       Class III) was found at the pylorus. The lesion was 10 mm in largest       dimension. Biopsies were taken with a cold forceps for histology.      The duodenal bulb, first portion of the duodenum and second portion of       the duodenum were normal.      A small amount of food (residue) was found in the gastric antrum. Impression:               - Esophagitis with no bleeding. Biopsied.                           - Z-line irregular. Biopsied.                           - Gastritis. Biopsied.                           - Non-bleeding gastric ulcer with a clean ulcer                            base (Forrest Class III). Biopsied.                           - Normal duodenal bulb, first portion of the                            duodenum and second portion of the duodenum. Moderate Sedation:      Per Anesthesia Care Recommendation:           - Patient has a contact number available for                            emergencies. The signs and symptoms of potential                            delayed complications were discussed with the                            patient. Return to normal activities tomorrow.  Written discharge instructions were provided to the                            patient.                           - Resume previous diet.                           - Continue present medications.                           - Await pathology results.                           - Repeat upper endoscopy in 10-12 weeks to evaluate                            the response to therapy.                           - Return to GI clinic in 6 weeks.                           -  Use a proton pump inhibitor PO BID.                           - No ibuprofen, naproxen, or other non-steroidal                            anti-inflammatory drugs.                           - Given small about of food debris in antrum,                            likely has component of partial GOO in the setting                            of pyloric ulcer Procedure Code(s):        --- Professional ---                           [phone], Esophagogastroduodenoscopy, flexible,                            transoral; with biopsy, single or multiple Diagnosis Code(s):        --- Professional ---                           K20.90, Esophagitis, unspecified without bleeding                           K22.89, Other specified disease of esophagus                           K29.70, Gastritis,  unspecified, without bleeding                           K25.9, Gastric ulcer, unspecified as acute or                            chronic, without hemorrhage or perforation                           R10.13, Epigastric pain                           R11.2, Nausea with vomiting, unspecified                           R93.3, Abnormal findings on diagnostic imaging of                            other parts of digestive tract CPT copyright 2022 [employer]. All rights reserved. The codes documented in this report are preliminary and upon coder review may  be revised to meet current compliance requirements. Carlin POUR. Cindie, DO Carlin POUR. Shawntrice Salle, DO 07/01/2024 8:22:30 AM This report has been signed electronically. Number of Addenda: 0

## 2024-07-02 ENCOUNTER — Encounter: Payer: Self-pay | Admitting: Gastroenterology

## 2024-07-02 LAB — SURGICAL PATHOLOGY

## 2024-07-03 ENCOUNTER — Encounter (HOSPITAL_COMMUNITY): Payer: Self-pay | Admitting: Internal Medicine

## 2024-07-04 NOTE — Anesthesia Postprocedure Evaluation (Signed)
"   Anesthesia Post Note  Patient: Electronics Engineer  Procedure(s) Performed: EGD (ESOPHAGOGASTRODUODENOSCOPY)  Patient location during evaluation: Phase II Anesthesia Type: MAC Level of consciousness: awake Pain management: pain level controlled Vital Signs Assessment: post-procedure vital signs reviewed and stable Respiratory status: spontaneous breathing and respiratory function stable Cardiovascular status: blood pressure returned to baseline and stable Postop Assessment: no headache and no apparent nausea or vomiting Anesthetic complications: no Comments: Late entry   No notable events documented.   Last Vitals:  Vitals:   07/01/24 0823 07/01/24 0829  BP: (!) 101/38 116/60  Pulse: 76   Resp: 18   Temp: (!) 36.4 C   SpO2:      Last Pain:  Vitals:   07/01/24 0823  TempSrc: Oral  PainSc: 0-No pain                 Yvonna PARAS Adain Geurin      "

## 2024-08-04 ENCOUNTER — Ambulatory Visit: Admitting: Internal Medicine

## 2024-08-04 ENCOUNTER — Other Ambulatory Visit: Payer: Self-pay

## 2024-08-04 ENCOUNTER — Encounter: Payer: Self-pay | Admitting: Internal Medicine

## 2024-08-04 VITALS — BP 130/71 | HR 85 | Temp 98.1°F | Wt 99.6 lb

## 2024-08-04 DIAGNOSIS — B440 Invasive pulmonary aspergillosis: Secondary | ICD-10-CM | POA: Diagnosis not present

## 2024-08-04 NOTE — Progress Notes (Signed)
 "      Patient ID: Teresa Edwards, female   DOB: 22-Apr-1951, 74 y.o.   MRN: [id number]  HPI Teresa Edwards is a 87 you F with COPD, pulmonary nodules, invasive aspergillosis  with cavitary pneumonia who was seen by Dr Fleeta Rothman where she was on itraconazole  and noticed her symptoms was improving. In order to minimize drug interactions with other medication, she was transitioned to cresemba , where she was doing well in July 2025.  Imaging in early November showed: LOWER CHEST: Lung bases demonstrate emphysema. Areas of architectural distortion and scarring at the bases. Spiculated focus at the right base measuring 9 mm   Outpatient Encounter Medications as of 08/04/2024  Medication Sig   albuterol  (PROVENTIL ) (2.5 MG/3ML) 0.083% nebulizer solution Take 3 mLs (2.5 mg total) by nebulization every 6 (six) hours as needed for wheezing or shortness of breath.   albuterol  (VENTOLIN  HFA) 108 (90 Base) MCG/ACT inhaler Inhale 2 puffs into the lungs every 6 (six) hours as needed for wheezing or shortness of breath.   diltiazem  (CARDIZEM  CD) 180 MG 24 hr capsule Take 180 mg by mouth daily.   Fluticasone -Umeclidin-Vilant (TRELEGY ELLIPTA ) 200-62.5-25 MCG/ACT AEPB Inhale 1 puff into the lungs daily.   Isavuconazonium Sulfate  (CRESEMBA ) 186 MG CAPS 2 capsules by mouth once daily.   oxyCODONE -acetaminophen  (PERCOCET) 10-325 MG tablet Take 1 tablet by mouth 2 (two) times daily as needed.   pantoprazole  (PROTONIX ) 40 MG tablet Take 1 tablet (40 mg total) by mouth 2 (two) times daily.   sucralfate  (CARAFATE ) 1 GM/10ML suspension Take 10 mLs (1 g total) by mouth with breakfast, with lunch, and with evening meal for 14 days. (Patient not taking: Reported on 08/04/2024)   No facility-administered encounter medications on file as of 08/04/2024.     Patient Active Problem List   Diagnosis Date Noted   COPD with acute exacerbation (HCC) 08/20/2023   Essential hypertension 08/20/2023   Acute encephalopathy 08/20/2023    CAP (community acquired pneumonia) 08/19/2023   Tobacco abuse 03/23/2023   Cavitary lung disease 03/03/2022   HTN (hypertension) 11/01/2016   Anxiety 11/01/2016   Chronic pain syndrome 11/01/2016   COPD exacerbation (HCC) 10/31/2016   Chronic respiratory failure with hypoxia (HCC) 10/31/2016   History of DVT (deep vein thrombosis) 10/31/2016   Chronic necrotizing pulmonary aspergillosis (HCC) 08/31/2014   Pain in limb 10/29/2013     Health Maintenance Due  Topic Date Due   COVID-19 Vaccine (1) Never done   Hepatitis C Screening  Never done   Pneumococcal Vaccine: 50+ Years (1 of 2 - PCV) Never done   Zoster Vaccines- Shingrix (1 of 2) Never done   Mammogram  Never done   Colonoscopy  Never done   Bone Density Scan  Never done   Influenza Vaccine  Never done     Review of Systems Chronic dyspnea on exertion. 12 point ros is otherwise negative Physical Exam   BP (!) 164/84   Pulse 85   Temp 98.1 F (36.7 C) (Oral)   Wt 99 lb 9.6 oz (45.2 kg)   SpO2 92%   BMI 17.10 kg/m   Physical Exam  Constitutional:  oriented to person, place, and time. appears well-developed and well-nourished. No distress.  HENT: [place]/AT, PERRLA, no scleral icterus Mouth/Throat: Oropharynx is clear and moist. No oropharyngeal exudate.  Cardiovascular: Normal rate, regular rhythm and normal heart sounds. Exam reveals no gallop and no friction rub.  No murmur heard.  Pulmonary/Chest: Effort normal and breath sounds  normal. No respiratory distress.  has no wheezes.  Neurological: alert and oriented to person, place, and time.  Skin: Skin is warm and dry. No rash noted. No erythema.  Psychiatric: a normal mood and affect.  behavior is normal.    CBC Lab Results  Component Value Date   WBC 9.8 05/20/2024   RBC 3.74 (L) 05/20/2024   HGB 11.3 (L) 05/20/2024   HCT 36.3 05/20/2024   PLT 464 (H) 05/20/2024   MCV 97.1 05/20/2024   MCH 30.2 05/20/2024   MCHC 31.1 05/20/2024   RDW 14.6 05/20/2024    LYMPHSABS 1.3 08/19/2023   MONOABS 0.6 08/19/2023   EOSABS 0.3 08/19/2023    BMET Lab Results  Component Value Date   NA 142 05/20/2024   K 3.7 05/20/2024   CL 103 05/20/2024   CO2 29 05/20/2024   GLUCOSE 88 05/20/2024   BUN 16 05/20/2024   CREATININE 0.57 05/20/2024   CALCIUM 8.8 (L) 05/20/2024   GFRNONAA >60 05/20/2024   GFRAA >60 08/01/2017    Lab Results  Component Value Date   ESRSEDRATE 65 (H) 08/04/2024   Lab Results  Component Value Date   CRP 107.0 (H) 08/04/2024     Assessment and Plan Pulmonary nodules, aspergillosis = continue on cresemba , will check Labs and imaging today.also has upcoming March appt with Dr. cody.  Long term medication management = will check cmp, sed rate and crp and cbc  Weight loss = ask for her to add carnation breakfast drink. Try to get above 100 lbs.     "

## 2024-08-05 LAB — COMPREHENSIVE METABOLIC PANEL WITH GFR
AG Ratio: 1.1 (calc) (ref 1.0–2.5)
ALT: 7 U/L (ref 6–29)
AST: 16 U/L (ref 10–35)
Albumin: 3.7 g/dL (ref 3.6–5.1)
Alkaline phosphatase (APISO): 119 U/L (ref 37–153)
BUN/Creatinine Ratio: 24 (calc) — ABNORMAL HIGH (ref 6–22)
BUN: 12 mg/dL (ref 7–25)
CO2: 29 mmol/L (ref 20–32)
Calcium: 8.9 mg/dL (ref 8.6–10.4)
Chloride: 103 mmol/L (ref 98–110)
Creat: 0.49 mg/dL — ABNORMAL LOW (ref 0.60–1.00)
Globulin: 3.5 g/dL (ref 1.9–3.7)
Glucose, Bld: 103 mg/dL — ABNORMAL HIGH (ref 65–99)
Potassium: 3.4 mmol/L — ABNORMAL LOW (ref 3.5–5.3)
Sodium: 143 mmol/L (ref 135–146)
Total Bilirubin: 0.3 mg/dL (ref 0.2–1.2)
Total Protein: 7.2 g/dL (ref 6.1–8.1)
eGFR: 99 mL/min/1.73m2

## 2024-08-05 LAB — CBC WITH DIFFERENTIAL/PLATELET
Absolute Lymphocytes: 715 {cells}/uL — ABNORMAL LOW (ref 850–3900)
Absolute Monocytes: 872 {cells}/uL (ref 200–950)
Basophils Absolute: 39 {cells}/uL (ref 0–200)
Basophils Relative: 0.4 %
Eosinophils Absolute: 118 {cells}/uL (ref 15–500)
Eosinophils Relative: 1.2 %
HCT: 39.4 % (ref 35.9–46.0)
Hemoglobin: 12.5 g/dL (ref 11.7–15.5)
MCH: 29.1 pg (ref 27.0–33.0)
MCHC: 31.7 g/dL (ref 31.6–35.4)
MCV: 91.6 fL (ref 81.4–101.7)
MPV: 10.6 fL (ref 7.5–12.5)
Monocytes Relative: 8.9 %
Neutro Abs: 8056 {cells}/uL — ABNORMAL HIGH (ref 1500–7800)
Neutrophils Relative %: 82.2 %
Platelets: 344 Thousand/uL (ref 140–400)
RBC: 4.3 Million/uL (ref 3.80–5.10)
RDW: 15.2 % — ABNORMAL HIGH (ref 11.0–15.0)
Total Lymphocyte: 7.3 %
WBC: 9.8 Thousand/uL (ref 3.8–10.8)

## 2024-08-05 LAB — SEDIMENTATION RATE: Sed Rate: 65 mm/h — ABNORMAL HIGH (ref 0–30)

## 2024-08-05 LAB — C-REACTIVE PROTEIN: CRP: 107 mg/L — ABNORMAL HIGH

## 2024-08-11 ENCOUNTER — Ambulatory Visit (HOSPITAL_COMMUNITY)

## 2024-08-14 ENCOUNTER — Ambulatory Visit: Admitting: Infectious Disease

## 2024-08-23 ENCOUNTER — Ambulatory Visit (HOSPITAL_COMMUNITY)

## 2024-08-26 ENCOUNTER — Ambulatory Visit: Admitting: Gastroenterology

## 2024-09-09 ENCOUNTER — Ambulatory Visit: Admitting: Gastroenterology
